# Patient Record
Sex: Male | Born: 1955 | Race: White | Hispanic: No | Marital: Married | Smoking: Former smoker
Health system: Southern US, Community
[De-identification: ages and names within clinical notes are randomized; demographics above are authoritative.]

## PROBLEM LIST (undated history)

## (undated) DIAGNOSIS — E785 Hyperlipidemia, unspecified: Secondary | ICD-10-CM

## (undated) DIAGNOSIS — M545 Low back pain, unspecified: Secondary | ICD-10-CM

## (undated) DIAGNOSIS — M4810 Ankylosing hyperostosis [Forestier], site unspecified: Secondary | ICD-10-CM

## (undated) DIAGNOSIS — I10 Essential (primary) hypertension: Secondary | ICD-10-CM

## (undated) DIAGNOSIS — J45909 Unspecified asthma, uncomplicated: Secondary | ICD-10-CM

## (undated) DIAGNOSIS — M109 Gout, unspecified: Secondary | ICD-10-CM

## (undated) DIAGNOSIS — F32A Depression, unspecified: Secondary | ICD-10-CM

## (undated) DIAGNOSIS — M5412 Radiculopathy, cervical region: Secondary | ICD-10-CM

## (undated) DIAGNOSIS — M1990 Unspecified osteoarthritis, unspecified site: Secondary | ICD-10-CM

## (undated) HISTORY — DX: Low back pain: M54.5

## (undated) HISTORY — PX: HIP SURGERY: SHX245

## (undated) HISTORY — PX: HERNIA REPAIR: SHX51

## (undated) HISTORY — DX: Hyperlipidemia, unspecified: E78.5

## (undated) HISTORY — DX: Gout, unspecified: M10.9

## (undated) HISTORY — PX: SHOULDER SURGERY: SHX246

## (undated) HISTORY — PX: CARDIAC CATHETERIZATION: SHX172

## (undated) HISTORY — DX: Radiculopathy, cervical region: M54.12

## (undated) HISTORY — PX: KNEE SURGERY: SHX244

## (undated) HISTORY — DX: Unspecified osteoarthritis, unspecified site: M19.90

## (undated) HISTORY — DX: Ankylosing hyperostosis (forestier), site unspecified: M48.10

## (undated) HISTORY — PX: COLONOSCOPY: SHX174

## (undated) HISTORY — DX: Essential (primary) hypertension: I10

## (undated) HISTORY — DX: Depression, unspecified: F32.A

## (undated) HISTORY — DX: Low back pain, unspecified: M54.50

---

## 1989-10-24 HISTORY — PX: BACK SURGERY: SHX140

## 2009-07-20 ENCOUNTER — Encounter: Admission: RE | Admit: 2009-07-20 | Discharge: 2009-07-20 | Payer: Self-pay | Admitting: Neurological Surgery

## 2010-11-15 ENCOUNTER — Encounter: Payer: Self-pay | Admitting: Neurological Surgery

## 2011-02-23 ENCOUNTER — Other Ambulatory Visit: Payer: Self-pay | Admitting: Neurological Surgery

## 2011-02-23 DIAGNOSIS — M5416 Radiculopathy, lumbar region: Secondary | ICD-10-CM

## 2011-02-23 DIAGNOSIS — M47816 Spondylosis without myelopathy or radiculopathy, lumbar region: Secondary | ICD-10-CM

## 2011-02-24 ENCOUNTER — Ambulatory Visit
Admission: RE | Admit: 2011-02-24 | Discharge: 2011-02-24 | Disposition: A | Discharge status: Home / Self Care | Payer: BC Managed Care – PPO | Source: Ambulatory Visit | Attending: Neurological Surgery | Admitting: Neurological Surgery

## 2011-02-24 DIAGNOSIS — M5416 Radiculopathy, lumbar region: Secondary | ICD-10-CM

## 2011-02-24 DIAGNOSIS — M47816 Spondylosis without myelopathy or radiculopathy, lumbar region: Secondary | ICD-10-CM

## 2011-03-08 ENCOUNTER — Other Ambulatory Visit: Payer: Self-pay | Admitting: Neurological Surgery

## 2011-03-08 DIAGNOSIS — M5416 Radiculopathy, lumbar region: Secondary | ICD-10-CM

## 2011-03-08 DIAGNOSIS — M47816 Spondylosis without myelopathy or radiculopathy, lumbar region: Secondary | ICD-10-CM

## 2011-03-09 ENCOUNTER — Other Ambulatory Visit: Payer: Self-pay | Admitting: Neurological Surgery

## 2011-03-09 ENCOUNTER — Ambulatory Visit
Admission: RE | Admit: 2011-03-09 | Discharge: 2011-03-09 | Disposition: A | Discharge status: Home / Self Care | Payer: BC Managed Care – PPO | Source: Ambulatory Visit | Attending: Neurological Surgery | Admitting: Neurological Surgery

## 2011-03-09 DIAGNOSIS — M5416 Radiculopathy, lumbar region: Secondary | ICD-10-CM

## 2011-03-09 DIAGNOSIS — M47816 Spondylosis without myelopathy or radiculopathy, lumbar region: Secondary | ICD-10-CM

## 2013-01-28 ENCOUNTER — Other Ambulatory Visit: Payer: Self-pay | Admitting: Neurological Surgery

## 2013-01-28 DIAGNOSIS — M47812 Spondylosis without myelopathy or radiculopathy, cervical region: Secondary | ICD-10-CM

## 2013-01-28 DIAGNOSIS — M546 Pain in thoracic spine: Secondary | ICD-10-CM

## 2013-01-31 ENCOUNTER — Ambulatory Visit
Admission: RE | Admit: 2013-01-31 | Discharge: 2013-01-31 | Disposition: A | Discharge status: Home / Self Care | Payer: BC Managed Care – PPO | Source: Ambulatory Visit | Attending: Neurological Surgery | Admitting: Neurological Surgery

## 2013-01-31 DIAGNOSIS — M546 Pain in thoracic spine: Secondary | ICD-10-CM

## 2013-01-31 DIAGNOSIS — M47812 Spondylosis without myelopathy or radiculopathy, cervical region: Secondary | ICD-10-CM

## 2013-01-31 MED ORDER — TRIAMCINOLONE ACETONIDE 40 MG/ML IJ SUSP (RADIOLOGY)
60.0000 mg | Freq: Once | INTRAMUSCULAR | Status: AC
  Administered 2013-01-31: 60 mg via EPIDURAL

## 2013-01-31 MED ORDER — IOHEXOL 300 MG/ML  SOLN
1.0000 mL | Freq: Once | INTRAMUSCULAR | Status: AC | PRN
Start: 2013-01-31 — End: 2013-01-31
  Administered 2013-01-31: 1 mL via EPIDURAL

## 2013-02-01 ENCOUNTER — Other Ambulatory Visit: Payer: Self-pay | Admitting: Neurological Surgery

## 2013-02-01 DIAGNOSIS — M542 Cervicalgia: Secondary | ICD-10-CM

## 2013-02-14 ENCOUNTER — Ambulatory Visit
Admission: RE | Admit: 2013-02-14 | Discharge: 2013-02-14 | Disposition: A | Discharge status: Home / Self Care | Payer: BC Managed Care – PPO | Source: Ambulatory Visit | Attending: Neurological Surgery | Admitting: Neurological Surgery

## 2013-02-14 VITALS — BP 144/81 | HR 79

## 2013-02-14 DIAGNOSIS — M5020 Other cervical disc displacement, unspecified cervical region: Secondary | ICD-10-CM

## 2013-02-14 DIAGNOSIS — M542 Cervicalgia: Secondary | ICD-10-CM

## 2013-02-14 MED ORDER — IOHEXOL 300 MG/ML  SOLN
1.0000 mL | Freq: Once | INTRAMUSCULAR | Status: AC | PRN
  Administered 2013-02-14: 1 mL via EPIDURAL

## 2013-02-14 MED ORDER — TRIAMCINOLONE ACETONIDE 40 MG/ML IJ SUSP (RADIOLOGY)
60.0000 mg | Freq: Once | INTRAMUSCULAR | Status: AC
  Administered 2013-02-14: 60 mg via EPIDURAL

## 2013-07-12 ENCOUNTER — Other Ambulatory Visit: Payer: Self-pay | Admitting: Neurological Surgery

## 2013-07-24 ENCOUNTER — Inpatient Hospital Stay (HOSPITAL_COMMUNITY): Admission: RE | Admit: 2013-07-24 | Payer: BC Managed Care – PPO | Source: Ambulatory Visit

## 2013-07-24 ENCOUNTER — Encounter (HOSPITAL_COMMUNITY): Payer: Self-pay

## 2013-07-26 ENCOUNTER — Encounter (HOSPITAL_COMMUNITY)
Admission: RE | Admit: 2013-07-26 | Discharge: 2013-07-26 | Disposition: A | Discharge status: Home / Self Care | Payer: BC Managed Care – PPO | Source: Ambulatory Visit | Attending: Neurological Surgery | Admitting: Neurological Surgery

## 2013-07-26 ENCOUNTER — Encounter (HOSPITAL_COMMUNITY): Payer: Self-pay

## 2013-07-26 ENCOUNTER — Other Ambulatory Visit (HOSPITAL_COMMUNITY): Payer: Self-pay | Admitting: *Deleted

## 2013-07-26 DIAGNOSIS — Z01818 Encounter for other preprocedural examination: Secondary | ICD-10-CM | POA: Insufficient documentation

## 2013-07-26 DIAGNOSIS — Z01812 Encounter for preprocedural laboratory examination: Secondary | ICD-10-CM | POA: Insufficient documentation

## 2013-07-26 HISTORY — DX: Unspecified asthma, uncomplicated: J45.909

## 2013-07-26 LAB — CBC WITH DIFFERENTIAL/PLATELET
HCT: 48.4 % (ref 39.0–52.0)
Hemoglobin: 17.1 g/dL — ABNORMAL HIGH (ref 13.0–17.0)
Lymphocytes Relative: 25 % (ref 12–46)
Lymphs Abs: 1.6 10*3/uL (ref 0.7–4.0)
MCHC: 35.3 g/dL (ref 30.0–36.0)
MCV: 93.8 fL (ref 78.0–100.0)
Monocytes Absolute: 0.9 10*3/uL (ref 0.1–1.0)
Monocytes Relative: 14 % — ABNORMAL HIGH (ref 3–12)
Neutrophils Relative %: 53 % (ref 43–77)
Platelets: 157 10*3/uL (ref 150–400)
RBC: 5.16 MIL/uL (ref 4.22–5.81)
WBC: 6.2 10*3/uL (ref 4.0–10.5)

## 2013-07-26 LAB — BASIC METABOLIC PANEL
BUN: 12 mg/dL (ref 6–23)
CO2: 29 mEq/L (ref 19–32)
Calcium: 8.8 mg/dL (ref 8.4–10.5)
Chloride: 101 mEq/L (ref 96–112)
GFR calc Af Amer: >90 mL/min (ref >90)
Glucose, Bld: 103 mg/dL — ABNORMAL HIGH (ref 70–99)
Potassium: 4.7 mEq/L (ref 3.5–5.1)

## 2013-07-26 NOTE — Pre-Procedure Instructions (Signed)
MARCELLE BEBOUT  07/26/2013   Your procedure is scheduled on:  Monday, July 29, 2013 at 1:30 PM.   Report to [facility] Main Entrance "A" at 10:30 AM.   Call this number if you have problems the morning of surgery: [phone]   Remember:   Do not eat food or drink liquids after midnight Sunday, 07/28/13.   Take these medicines the morning of surgery with A SIP OF WATER: oxyCODONE-acetaminophen (PERCOCET/ROXICET) -if needed  Stop all Vitamins, Herbal Medications, Fish Oil, Zyrtec-D, Aspirin and don't take any NSAIDS (Ibuprofen, Motrin, Aleve, Naproxen, etc.) as of today 07/26/13.     Do not wear jewelry  Do not wear lotions, powders, or cologne. You may wear deodorant.  Do not shave 48 hours prior to surgery. Men may shave face and neck.  Do not bring valuables to the hospital.  [facility] is not responsible                  for any belongings or valuables.               Contacts, dentures or bridgework may not be worn into surgery.  Leave suitcase in the car. After surgery it may be brought to your room.  For patients admitted to the hospital, discharge time is determined by your                treatment team.     Special Instructions: Shower using CHG 2 nights before surgery and the night before surgery.  If you shower the day of surgery use CHG.  Use special wash - you have one bottle of CHG for all showers.  You should use approximately 1/3 of the bottle for each shower.   Please read over the following fact sheets that you were given: Pain Booklet, Coughing and Deep Breathing, Blood Transfusion Information, MRSA Information and Surgical Site Infection Prevention

## 2013-07-26 NOTE — Progress Notes (Signed)
07/26/13 1252  OBSTRUCTIVE SLEEP APNEA  Have you ever been diagnosed with sleep apnea through a sleep study? No  Do you snore loudly (loud enough to be heard through closed doors)?  1  Do you often feel tired, fatigued, or sleepy during the daytime? 1  Has anyone observed you stop breathing during your sleep? 0  Do you have, or are you being treated for high blood pressure? 0  BMI more than 35 kg/m2? 1  Age over 57 years old? 1  Neck circumference greater than 40 cm/18 inches? 1  Gender: 1  Obstructive Sleep Apnea Score 6  Score 4 or greater  Results sent to PCP

## 2013-07-26 NOTE — Progress Notes (Signed)
Pt have a Type and Screen drawn today, but states he would not be able to tolerate the armband to be on him until surgery. He requested that we draw blood for the Type and Screen on day of surgery.

## 2013-07-29 ENCOUNTER — Inpatient Hospital Stay (HOSPITAL_COMMUNITY): Payer: BC Managed Care – PPO

## 2013-07-29 ENCOUNTER — Encounter (HOSPITAL_COMMUNITY)
Admission: RE | Disposition: A | Discharge status: Home / Self Care | Payer: Self-pay | Source: Ambulatory Visit | Attending: Neurological Surgery

## 2013-07-29 ENCOUNTER — Inpatient Hospital Stay (HOSPITAL_COMMUNITY): Payer: BC Managed Care – PPO | Admitting: Anesthesiology

## 2013-07-29 ENCOUNTER — Encounter (HOSPITAL_COMMUNITY): Payer: Self-pay | Admitting: *Deleted

## 2013-07-29 ENCOUNTER — Encounter (HOSPITAL_COMMUNITY): Payer: Self-pay | Admitting: Anesthesiology

## 2013-07-29 ENCOUNTER — Inpatient Hospital Stay (HOSPITAL_COMMUNITY)
Admission: RE | Admit: 2013-07-29 | Discharge: 2013-08-01 | DRG: 755 | Disposition: A | Discharge status: Home / Self Care | Payer: BC Managed Care – PPO | Source: Ambulatory Visit | Attending: Neurological Surgery | Admitting: Neurological Surgery

## 2013-07-29 DIAGNOSIS — Z87891 Personal history of nicotine dependence: Secondary | ICD-10-CM

## 2013-07-29 DIAGNOSIS — M4716 Other spondylosis with myelopathy, lumbar region: Secondary | ICD-10-CM | POA: Diagnosis present

## 2013-07-29 DIAGNOSIS — M5416 Radiculopathy, lumbar region: Secondary | ICD-10-CM | POA: Diagnosis present

## 2013-07-29 DIAGNOSIS — Z01812 Encounter for preprocedural laboratory examination: Secondary | ICD-10-CM

## 2013-07-29 DIAGNOSIS — M216X9 Other acquired deformities of unspecified foot: Secondary | ICD-10-CM | POA: Diagnosis present

## 2013-07-29 DIAGNOSIS — Z7982 Long term (current) use of aspirin: Secondary | ICD-10-CM

## 2013-07-29 DIAGNOSIS — D62 Acute posthemorrhagic anemia: Secondary | ICD-10-CM

## 2013-07-29 DIAGNOSIS — Z23 Encounter for immunization: Secondary | ICD-10-CM

## 2013-07-29 DIAGNOSIS — Z79899 Other long term (current) drug therapy: Secondary | ICD-10-CM

## 2013-07-29 DIAGNOSIS — M47817 Spondylosis without myelopathy or radiculopathy, lumbosacral region: Principal | ICD-10-CM | POA: Diagnosis present

## 2013-07-29 LAB — ABO/RH: ABO/RH(D): A POS

## 2013-07-29 LAB — TYPE AND SCREEN
ABO/RH(D): A POS
Antibody Screen: NEGATIVE

## 2013-07-29 SURGERY — POSTERIOR LUMBAR FUSION 2 LEVEL
Anesthesia: General | Site: Back | Wound class: Clean

## 2013-07-29 MED ORDER — HYDROMORPHONE HCL PF 1 MG/ML IJ SOLN
0.5000 mg | INTRAMUSCULAR | Status: DC | PRN
  Administered 2013-07-30: 1 mg via INTRAVENOUS
  Filled 2013-07-29: qty 1

## 2013-07-29 MED ORDER — PHENOL 1.4 % MT LIQD: 1.0000 | OROMUCOSAL | Status: DC | PRN

## 2013-07-29 MED ORDER — SODIUM CHLORIDE 0.9 % IV SOLN: 250.0000 mL | INTRAVENOUS | Status: DC

## 2013-07-29 MED ORDER — ACETAMINOPHEN 650 MG RE SUPP: 650.0000 mg | RECTAL | Status: DC | PRN

## 2013-07-29 MED ORDER — PHENYLEPHRINE HCL 10 MG/ML IJ SOLN
INTRAMUSCULAR | Status: DC | PRN
  Administered 2013-07-29: 80 ug via INTRAVENOUS

## 2013-07-29 MED ORDER — ROCURONIUM BROMIDE 100 MG/10ML IV SOLN
INTRAVENOUS | Status: DC | PRN
  Administered 2013-07-29: 20 mg via INTRAVENOUS
  Administered 2013-07-29 (×2): 10 mg via INTRAVENOUS
  Administered 2013-07-29: 20 mg via INTRAVENOUS
  Administered 2013-07-29: 50 mg via INTRAVENOUS
  Administered 2013-07-29: 10 mg via INTRAVENOUS
  Administered 2013-07-29: 20 mg via INTRAVENOUS
  Administered 2013-07-29: 10 mg via INTRAVENOUS

## 2013-07-29 MED ORDER — VANCOMYCIN HCL IN DEXTROSE 1-5 GM/200ML-% IV SOLN
INTRAVENOUS | Status: AC
  Administered 2013-07-29: 1000 mg via INTRAVENOUS
  Filled 2013-07-29: qty 200

## 2013-07-29 MED ORDER — METHOCARBAMOL 500 MG PO TABS
ORAL_TABLET | ORAL | Status: AC
  Filled 2013-07-29: qty 1

## 2013-07-29 MED ORDER — SODIUM CHLORIDE 0.9 % IV SOLN
INTRAVENOUS | Status: DC
  Administered 2013-07-29 – 2013-07-30 (×2): via INTRAVENOUS

## 2013-07-29 MED ORDER — SUFENTANIL CITRATE 50 MCG/ML IV SOLN
INTRAVENOUS | Status: DC | PRN
  Administered 2013-07-29: 20 ug via INTRAVENOUS
  Administered 2013-07-29 (×3): 10 ug via INTRAVENOUS
  Administered 2013-07-29 (×2): 15 ug via INTRAVENOUS
  Administered 2013-07-29: 20 ug via INTRAVENOUS

## 2013-07-29 MED ORDER — LIDOCAINE-EPINEPHRINE 1 %-1:100000 IJ SOLN
INTRAMUSCULAR | Status: DC | PRN
  Administered 2013-07-29: 5 mL

## 2013-07-29 MED ORDER — ONDANSETRON HCL 4 MG/2ML IJ SOLN
INTRAMUSCULAR | Status: DC | PRN
  Administered 2013-07-29: 4 mg via INTRAMUSCULAR

## 2013-07-29 MED ORDER — MENTHOL 3 MG MT LOZG: 1.0000 | LOZENGE | OROMUCOSAL | Status: DC | PRN

## 2013-07-29 MED ORDER — DEXAMETHASONE SODIUM PHOSPHATE 10 MG/ML IJ SOLN
INTRAMUSCULAR | Status: DC | PRN
  Administered 2013-07-29: 10 mg via INTRAVENOUS

## 2013-07-29 MED ORDER — FLEET ENEMA 7-19 GM/118ML RE ENEM: 1.0000 | ENEMA | Freq: Once | RECTAL | Status: AC | PRN

## 2013-07-29 MED ORDER — SODIUM CHLORIDE 0.9 % IJ SOLN
3.0000 mL | Freq: Two times a day (BID) | INTRAMUSCULAR | Status: DC
  Administered 2013-07-30 – 2013-07-31 (×3): 3 mL via INTRAVENOUS

## 2013-07-29 MED ORDER — LACTATED RINGERS IV SOLN
INTRAVENOUS | Status: DC | PRN
  Administered 2013-07-29 (×3): via INTRAVENOUS

## 2013-07-29 MED ORDER — HYDROMORPHONE HCL PF 1 MG/ML IJ SOLN
INTRAMUSCULAR | Status: AC
  Filled 2013-07-29: qty 1

## 2013-07-29 MED ORDER — BUPIVACAINE HCL (PF) 0.5 % IJ SOLN
INTRAMUSCULAR | Status: DC | PRN
  Administered 2013-07-29: 5 mL
  Administered 2013-07-29: 20 mL

## 2013-07-29 MED ORDER — BISACODYL 10 MG RE SUPP: 10.0000 mg | Freq: Every day | RECTAL | Status: DC | PRN

## 2013-07-29 MED ORDER — ONDANSETRON HCL 4 MG/2ML IJ SOLN: 4.0000 mg | INTRAMUSCULAR | Status: DC | PRN

## 2013-07-29 MED ORDER — PROPOFOL 10 MG/ML IV BOLUS
INTRAVENOUS | Status: DC | PRN
  Administered 2013-07-29: 200 mg via INTRAVENOUS

## 2013-07-29 MED ORDER — SODIUM CHLORIDE 0.9 % IR SOLN
Status: DC | PRN
  Administered 2013-07-29: 17:00:00

## 2013-07-29 MED ORDER — POLYETHYLENE GLYCOL 3350 17 G PO PACK
17.0000 g | PACK | Freq: Every day | ORAL | Status: DC | PRN
  Filled 2013-07-29: qty 1

## 2013-07-29 MED ORDER — FENTANYL CITRATE 0.05 MG/ML IJ SOLN
INTRAMUSCULAR | Status: AC
  Administered 2013-07-29: 50 ug
  Filled 2013-07-29: qty 2

## 2013-07-29 MED ORDER — THROMBIN 5000 UNITS EX SOLR
OROMUCOSAL | Status: DC | PRN
  Administered 2013-07-29: 19:00:00 via TOPICAL

## 2013-07-29 MED ORDER — DOCUSATE SODIUM 100 MG PO CAPS
100.0000 mg | ORAL_CAPSULE | Freq: Two times a day (BID) | ORAL | Status: DC
  Administered 2013-07-30 – 2013-07-31 (×4): 100 mg via ORAL
  Filled 2013-07-29 (×3): qty 1

## 2013-07-29 MED ORDER — SODIUM CHLORIDE 0.9 % IJ SOLN: 3.0000 mL | INTRAMUSCULAR | Status: DC | PRN

## 2013-07-29 MED ORDER — NEOSTIGMINE METHYLSULFATE 1 MG/ML IJ SOLN
INTRAMUSCULAR | Status: DC | PRN
  Administered 2013-07-29: 5 mg via INTRAVENOUS

## 2013-07-29 MED ORDER — LIDOCAINE HCL (CARDIAC) 20 MG/ML IV SOLN
INTRAVENOUS | Status: DC | PRN
  Administered 2013-07-29: 50 mg via INTRAVENOUS

## 2013-07-29 MED ORDER — METHOCARBAMOL 100 MG/ML IJ SOLN
500.0000 mg | Freq: Four times a day (QID) | INTRAVENOUS | Status: DC | PRN
  Filled 2013-07-29: qty 5

## 2013-07-29 MED ORDER — SODIUM CHLORIDE 0.9 % IV SOLN
INTRAVENOUS | Status: DC | PRN
  Administered 2013-07-29 (×2): via INTRAVENOUS

## 2013-07-29 MED ORDER — OXYCODONE-ACETAMINOPHEN 5-325 MG PO TABS
1.0000 | ORAL_TABLET | ORAL | Status: DC | PRN
  Administered 2013-07-29 – 2013-08-01 (×8): 2 via ORAL
  Filled 2013-07-29 (×8): qty 2

## 2013-07-29 MED ORDER — MIDAZOLAM HCL 5 MG/5ML IJ SOLN
INTRAMUSCULAR | Status: DC | PRN
  Administered 2013-07-29: 2 mg via INTRAVENOUS

## 2013-07-29 MED ORDER — FENTANYL CITRATE 0.05 MG/ML IJ SOLN
INTRAMUSCULAR | Status: AC
  Administered 2013-07-29 (×2): 50 ug
  Filled 2013-07-29: qty 2

## 2013-07-29 MED ORDER — THROMBIN 20000 UNITS EX SOLR
CUTANEOUS | Status: DC | PRN
  Administered 2013-07-29: 17:00:00 via TOPICAL

## 2013-07-29 MED ORDER — ONDANSETRON HCL 4 MG/2ML IJ SOLN: 4.0000 mg | Freq: Once | INTRAMUSCULAR | Status: DC | PRN

## 2013-07-29 MED ORDER — LACTATED RINGERS IV SOLN
INTRAVENOUS | Status: DC
  Administered 2013-07-29: 11:00:00 via INTRAVENOUS

## 2013-07-29 MED ORDER — PHENYLEPHRINE HCL 10 MG/ML IJ SOLN
10.0000 mg | INTRAVENOUS | Status: DC | PRN
  Administered 2013-07-29: 20 ug/min via INTRAVENOUS

## 2013-07-29 MED ORDER — ACETAMINOPHEN 325 MG PO TABS: 650.0000 mg | ORAL_TABLET | ORAL | Status: DC | PRN

## 2013-07-29 MED ORDER — HYDROMORPHONE HCL PF 1 MG/ML IJ SOLN
0.2500 mg | INTRAMUSCULAR | Status: DC | PRN
  Administered 2013-07-29 (×4): 0.5 mg via INTRAVENOUS

## 2013-07-29 MED ORDER — GLYCOPYRROLATE 0.2 MG/ML IJ SOLN
INTRAMUSCULAR | Status: DC | PRN
  Administered 2013-07-29: .8 mg via INTRAVENOUS

## 2013-07-29 MED ORDER — SENNA 8.6 MG PO TABS
1.0000 | ORAL_TABLET | Freq: Two times a day (BID) | ORAL | Status: DC
  Administered 2013-07-30 – 2013-07-31 (×4): 8.6 mg via ORAL
  Filled 2013-07-29 (×7): qty 1

## 2013-07-29 MED ORDER — VANCOMYCIN HCL 1000 MG IV SOLR
INTRAVENOUS | Status: AC
  Filled 2013-07-29: qty 1000

## 2013-07-29 MED ORDER — 0.9 % SODIUM CHLORIDE (POUR BTL) OPTIME
TOPICAL | Status: DC | PRN
  Administered 2013-07-29: 1000 mL

## 2013-07-29 MED ORDER — METHOCARBAMOL 500 MG PO TABS
500.0000 mg | ORAL_TABLET | Freq: Four times a day (QID) | ORAL | Status: DC | PRN
  Administered 2013-07-29 – 2013-07-30 (×2): 500 mg via ORAL
  Filled 2013-07-29: qty 1

## 2013-07-29 SURGICAL SUPPLY — 70 items
BAG DECANTER FOR FLEXI CONT (MISCELLANEOUS) ×2 IMPLANT
BLADE SURG ROTATE 9660 (MISCELLANEOUS) IMPLANT
BUR MATCHSTICK NEURO 3.0 LAGG (BURR) ×2 IMPLANT
CAGE 9MM (Cage) ×4 IMPLANT
CANISTER SUCTION 2500CC (MISCELLANEOUS) ×2 IMPLANT
CLOTH BEACON ORANGE TIMEOUT ST (SAFETY) ×2 IMPLANT
CONT SPEC 4OZ CLIKSEAL STRL BL (MISCELLANEOUS) ×4 IMPLANT
COVER BACK TABLE 24X17X13 BIG (DRAPES) IMPLANT
COVER TABLE BACK 60X90 (DRAPES) ×2 IMPLANT
DECANTER SPIKE VIAL GLASS SM (MISCELLANEOUS) IMPLANT
DERMABOND ADVANCED (GAUZE/BANDAGES/DRESSINGS) ×1
DERMABOND ADVANCED .7 DNX12 (GAUZE/BANDAGES/DRESSINGS) ×1 IMPLANT
DRAPE C-ARM 42X72 X-RAY (DRAPES) ×4 IMPLANT
DRAPE LAPAROTOMY 100X72X124 (DRAPES) ×2 IMPLANT
DRAPE POUCH INSTRU U-SHP 10X18 (DRAPES) ×2 IMPLANT
DRAPE PROXIMA HALF (DRAPES) IMPLANT
DRSG OPSITE POSTOP 4X8 (GAUZE/BANDAGES/DRESSINGS) ×2 IMPLANT
DURAPREP 26ML APPLICATOR (WOUND CARE) ×2 IMPLANT
ELECT REM PT RETURN 9FT ADLT (ELECTROSURGICAL) ×2
ELECTRODE REM PT RTRN 9FT ADLT (ELECTROSURGICAL) ×1 IMPLANT
GAUZE SPONGE 4X4 16PLY XRAY LF (GAUZE/BANDAGES/DRESSINGS) IMPLANT
GLOVE BIO SURGEON STRL SZ 6.5 (GLOVE) ×4 IMPLANT
GLOVE BIOGEL PI IND STRL 6.5 (GLOVE) ×1 IMPLANT
GLOVE BIOGEL PI IND STRL 7.5 (GLOVE) ×1 IMPLANT
GLOVE BIOGEL PI IND STRL 8.5 (GLOVE) ×2 IMPLANT
GLOVE BIOGEL PI INDICATOR 6.5 (GLOVE) ×1
GLOVE BIOGEL PI INDICATOR 7.5 (GLOVE) ×1
GLOVE BIOGEL PI INDICATOR 8.5 (GLOVE) ×2
GLOVE ECLIPSE 7.5 STRL STRAW (GLOVE) ×2 IMPLANT
GLOVE ECLIPSE 8.5 STRL (GLOVE) ×4 IMPLANT
GLOVE EXAM NITRILE LRG STRL (GLOVE) IMPLANT
GLOVE EXAM NITRILE MD LF STRL (GLOVE) ×2 IMPLANT
GLOVE EXAM NITRILE XL STR (GLOVE) IMPLANT
GLOVE EXAM NITRILE XS STR PU (GLOVE) IMPLANT
GOWN BRE IMP SLV AUR LG STRL (GOWN DISPOSABLE) ×2 IMPLANT
GOWN BRE IMP SLV AUR XL STRL (GOWN DISPOSABLE) ×2 IMPLANT
GOWN STRL REIN 2XL LVL4 (GOWN DISPOSABLE) ×4 IMPLANT
HEMOSTAT POWDER KIT SURGIFOAM (HEMOSTASIS) IMPLANT
KIT BASIN OR (CUSTOM PROCEDURE TRAY) ×2 IMPLANT
KIT ROOM TURNOVER OR (KITS) ×2 IMPLANT
MILL MEDIUM DISP (BLADE) ×2 IMPLANT
NEEDLE HYPO 22GX1.5 SAFETY (NEEDLE) ×2 IMPLANT
NEEDLE SPNL 18GX3.5 QUINCKE PK (NEEDLE) ×2 IMPLANT
NS IRRIG 1000ML POUR BTL (IV SOLUTION) ×2 IMPLANT
PACK FOAM VITOSS 10CC (Orthopedic Implant) ×2 IMPLANT
PACK LAMINECTOMY NEURO (CUSTOM PROCEDURE TRAY) ×2 IMPLANT
PAD ARMBOARD 7.5X6 YLW CONV (MISCELLANEOUS) ×6 IMPLANT
PATTIES SURGICAL .5 X1 (DISPOSABLE) ×2 IMPLANT
PEEK PLIF NOVEL 9X25X10 (Peek) ×4 IMPLANT
ROD TI 5.5MM 6CM (Rod) ×4 IMPLANT
SCREW PEDICLE 45MM (Screw) ×4 IMPLANT
SCREW POLYAX 6.5X45MM (Screw) ×8 IMPLANT
SCREW SET SPINAL STD HEXALOBE (Screw) ×12 IMPLANT
SPONGE GAUZE 4X4 12PLY (GAUZE/BANDAGES/DRESSINGS) ×2 IMPLANT
SPONGE LAP 4X18 X RAY DECT (DISPOSABLE) ×2 IMPLANT
SPONGE SURGIFOAM ABS GEL 100 (HEMOSTASIS) ×2 IMPLANT
SUT PROLENE 6 0 BV (SUTURE) ×2 IMPLANT
SUT VIC AB 1 CT1 18XBRD ANBCTR (SUTURE) ×1 IMPLANT
SUT VIC AB 1 CT1 8-18 (SUTURE) ×1
SUT VIC AB 2-0 CP2 18 (SUTURE) ×2 IMPLANT
SUT VIC AB 3-0 SH 8-18 (SUTURE) ×2 IMPLANT
SYR 20ML ECCENTRIC (SYRINGE) ×2 IMPLANT
SYR 3ML LL SCALE MARK (SYRINGE) ×8 IMPLANT
SYR 5ML LL (SYRINGE) IMPLANT
TOWEL OR 17X24 6PK STRL BLUE (TOWEL DISPOSABLE) ×2 IMPLANT
TOWEL OR 17X26 10 PK STRL BLUE (TOWEL DISPOSABLE) ×2 IMPLANT
TRAP SPECIMEN MUCOUS 40CC (MISCELLANEOUS) ×2 IMPLANT
TRAY FOLEY CATH 14FRSI W/METER (CATHETERS) IMPLANT
TRAY FOLEY CATH 16FRSI W/METER (SET/KITS/TRAYS/PACK) ×2 IMPLANT
WATER STERILE IRR 1000ML POUR (IV SOLUTION) ×2 IMPLANT

## 2013-07-29 NOTE — Anesthesia Procedure Notes (Signed)
Procedure Name: Intubation Date/Time: 07/29/2013 4:50 PM Performed by: Coralee Rud Pre-anesthesia Checklist: Patient identified, Emergency Drugs available, Suction available, Patient being monitored and Timeout performed Patient Re-evaluated:Patient Re-evaluated prior to inductionOxygen Delivery Method: Circle system utilized Preoxygenation: Pre-oxygenation with 100% oxygen Intubation Type: IV induction Ventilation: Mask ventilation without difficulty and Oral airway inserted - appropriate to patient size Laryngoscope Size: Hyacinth Meeker and 2 (Attempted x 2 with Hyacinth Meeker 3,  Dr. Noreene Larsson intubated with #2) Grade View: Grade III Tube type: Oral Tube size: 8.0 mm Number of attempts: 3 (I attempted x 2,  Dr. Noreene Larsson intubated on 3rd attempt.) Airway Equipment and Method: Stylet Placement Confirmation: ETT inserted through vocal cords under direct vision,  positive ETCO2 and breath sounds checked- equal and bilateral Secured at: 22 cm Tube secured with: Tape Dental Injury: Teeth and Oropharynx as per pre-operative assessment and Injury to lip  Future Recommendations: Recommend- induction with short-acting agent, and alternative techniques readily available

## 2013-07-29 NOTE — H&P (Signed)
Ronald Parsons is an 57 y.o. male.   Chief Complaint: Back pain leg weakness. HPI: Patient is a 57 year old individual who has had significant problems with back pain in lower extremity he weakness. He has been treated conservatively with a number of epidural steroid injections however back pain is been worsening such that he cannot do his usual work. 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 Is being admitted now to undergo surgical decompression and stabilization lower 2 levels of his lumbar spine secondary to a markedly degenerated broad-based disc protrusion at L4-L5 and lateral recess stenosis that is severe at L5-S1.  Past Medical History  Diagnosis Date  . Asthma     allergy related    Past Surgical History  Procedure Laterality Date  . Colonoscopy    . Back surgery  1991    laminectomy  . Hernia repair      inguinal hernia repair as a baby on right groin  . Cardiac catheterization      5-7 years ago, states it was clear    No family history on file. Social History:  reports that he quit smoking about 10 years ago. His smokeless tobacco use includes Chew. He reports that  drinks alcohol. He reports that he does not use illicit drugs.  Allergies:  Allergies  Allergen Reactions  . Penicillins Hives    As a child    No prescriptions prior to admission    No results found for this or any previous visit (from the past 48 hour(s)). No results found.  Review of Systems  Constitutional:       Weakness of both lower extremities  HENT: Negative.   Eyes: Negative.   Respiratory: Negative.   Cardiovascular: Negative.   Gastrointestinal: Negative.   Genitourinary: Negative.   Musculoskeletal: Positive for back pain.       Neck pain with radiation into both lower extremities  Skin: Negative.   Neurological: Positive for  weakness.  Endo/Heme/Allergies: Negative.   Psychiatric/Behavioral: Negative.     There were no vitals taken for this visit. Physical Exam  Constitutional: He is oriented to person, place, and time. He appears well-developed and well-nourished.  HENT:  Head: Normocephalic and atraumatic.  Eyes: Conjunctivae and EOM are normal. Pupils are equal, round, and reactive to light.  Neck: Normal range of motion. Neck supple.  Cardiovascular: Regular rhythm.   Respiratory: Effort normal and breath sounds normal.  GI: Soft. Bowel sounds are normal.  Musculoskeletal:  Centralized back pain with substantial muscle spasm. Significant weakness in tibialis anterior on the right recent since past week  Neurological: He is alert and oriented to person, place, and time.  Cranial nerve examination is normal lower extremity strength however with a reveals weakness in tibialis anterior graded 2/5 this is acute. Extensor hallucis longus is also very weak when out of 5. Sensory exam reveals diminished pin sensation on the dorsum of the right foot in the tibialis anterior group.  Skin: Skin is warm and dry.  Psychiatric: He has a normal mood and affect. His behavior is normal. Judgment and thought content normal.     Assessment/Plan Decompression arthrodesis L4-5 and L5-S1 secondary to spondylitic stenosis. Recent footdrop.  Ronald Parsons J 07/29/2013, 7:54 AM

## 2013-07-29 NOTE — Progress Notes (Signed)
Patient ID: Ronald Parsons, male   DOB: 1956-03-19, 57 y.o.   MRN: [id number] Vital signs are stable. Moderate back soreness. Incision remains dry and dressing intact. Motor function in lower extremities good.

## 2013-07-29 NOTE — Transfer of Care (Signed)
Immediate Anesthesia Transfer of Care Note  Patient: Ronald Parsons  Procedure(s) Performed: Procedure(s) with comments: POSTERIOR LUMBAR FUSION 2 LEVEL (N/A) - L4-5 L5-S1 Posterior lumbar interbody fusion  Patient Location: PACU  Anesthesia Type:General  Level of Consciousness: awake, alert  and oriented  Airway & Oxygen Therapy: Patient connected to nasal cannula oxygen  Post-op Assessment: Report given to PACU RN, Post -op Vital signs reviewed and stable and Patient moving all extremities X 4  Post vital signs: Reviewed and stable  Complications: No apparent anesthesia complications

## 2013-07-29 NOTE — Anesthesia Preprocedure Evaluation (Signed)
Anesthesia Evaluation  Patient identified by MRN, date of birth, ID band Patient awake    Reviewed: Allergy & Precautions, H&P , NPO status , Patient's Chart, lab work & pertinent test results  Airway Mallampati: III TM Distance: >3 FB Neck ROM: Full    Dental  (+) Teeth Intact and Dental Advisory Given   Pulmonary  breath sounds clear to auscultation        Cardiovascular Rate:Normal     Neuro/Psych    GI/Hepatic   Endo/Other    Renal/GU      Musculoskeletal   Abdominal   Peds  Hematology   Anesthesia Other Findings   Reproductive/Obstetrics                           Anesthesia Physical Anesthesia Plan  ASA: III  Anesthesia Plan: General   Post-op Pain Management:    Induction: Intravenous  Airway Management Planned: Oral ETT  Additional Equipment:   Intra-op Plan:   Post-operative Plan: Extubation in OR  Informed Consent: I have reviewed the patients History and Physical, chart, labs and discussed the procedure including the risks, benefits and alternatives for the proposed anesthesia with the patient or authorized representative who has indicated his/her understanding and acceptance.   Dental advisory given  Plan Discussed with: CRNA and Anesthesiologist  Anesthesia Plan Comments: (HNP L4-5 L5-S1 chronic low back pain Mild asthma)        Anesthesia Quick Evaluation

## 2013-07-29 NOTE — Op Note (Signed)
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 Lumbar stenosis Procedure: Laminectomy L5 bilateral laminotomies at L4-5 decompression of L4-L5 and S1 nerve roots individually more work than require for simple interbody technique. Posterior interbody arthrodesis using peek spacers local autograft and allograft pedicle screw fixation L4-L5 and the sacrum with posterior lateral arthrodesis using local autograft and allograft. Surgeon: Barnett Abu Assistant: Daryl Eastern Anesthesia: General endotracheal Indications: The patient is a 57 year old individual who's had significant back and bilateral lower extremity pain and more recently weakness in his right leg he has advanced spondylitic disease at L4-5 and L5-S1 is been advised regarding the need for surgical decompression and stabilization of both these joint levels. This taken to the operating room at this time.  Procedure: The patient was brought to the operating room supine on a stretcher. After the smooth induction of general endotracheal anesthesia, he was turned prone onto the operating table. The back was shaved prepped with alcohol and DuraPrep and draped in a sterile fashion. Midline incision was created through the previous scar and this was carried down to the lumbar dorsal fascia. Fascia was opened on either side of the midline to expose the interlaminar space is of L4-5 and L5-S1. On the left side at L5-S1 there was noted to be a previous laminotomy. Care was taken to protect the dura in this region. Dissection was then undertaken at L4-L5 where radical laminotomy was performed removing the entire inferior facet complex at L4-L5. This exposed the common dural tube. This was decompressed and the take off of the L4 nerve root superiorly was then found to be stenotic in the foramen and this was a room believed with a  2 and 3 mm Kerrison punch the L5 nerve root is similarly found to be significantly stenotic as it entered the foramen and this was decompressed with a combination of curettes and rongeurs and gentle use of a high-speed drill nonetheless during this procedure a small inadvertent durotomy occurred on the side of the dural tube. This was oversewn with a singular figure-of-eight 6-0 Prolene suture and was found to be watertight. The L5 nerve root was then decompressed fully in its length of the foramen. The S1 nerve root was decompressed using a combination of curettes and rongeurs to remove some overgrown bone from its bony foramen. The same decompression was performed on the right-sided nerve roots with attention given to each one as it had been on left. Then posterior interbody decompression and arthrodesis was performed by performing a total discectomy as possible L4-L5 and a total discectomy at L5-S1 and L4-L5 12 mm peek spacers were able to be placed into the disc space this was after adequate decompression of the disc space had been performed using a combination of curettes and rongeurs and the endplates were completely rongeured away. The autograft was packed into the disc space along with the peek spacers. At L5-S1 the disc space was noted to be much tighter. Here is able to place 9 mm peek spacers into the interspace with some remnants of the bone that is harvested from the lamina also placed into the disc space.  Attention was then turned to the lateral gutters these were decorticated and packed with combination of autograft and allograft this was done from L4-L5 into the sacral ala bilaterally using Cobb elevators and a high-speed drill to decorticate adequately. The patient had a very steep pelvis which made this particular  task difficult. Nonetheless a good amount of bone graft was placed into these lateral gutters.  Pedicle entry sites were then chosen at L4-L5 and the sacrum using 6.5 mm tap the tap  holes that were first sounded with an awl. Each of the screws was noted to be in good position without any evidence of cutout. Then 70 mm rods were used to connect between the screw heads from L4 to the sacrum. Attention was then turned to the common dural tube and he chose the L4-L5 and S1 nerve roots these were found to be well decompressed. Hemostasis in the soft tissues was meticulously obtained, and then the wound was closed with #1 Vicryl in the lumbar dorsal fascia. 2-0 Vicryl was used in the subcutaneous tissues. 3-0 Vicryl was used as a subcuticular skin. Dermabond was placed on the skin. Patient had about 1700 cc of blood loss he received 750 cc of Cell Saver blood, and use returned to the recovery room in stable condition.

## 2013-07-29 NOTE — Anesthesia Postprocedure Evaluation (Signed)
  Anesthesia Post-op Note  Patient: Ronald Parsons  Procedure(s) Performed: Procedure(s) with comments: POSTERIOR LUMBAR FUSION 2 LEVEL (N/A) - L4-5 L5-S1 Posterior lumbar interbody fusion  Patient Location: PACU  Anesthesia Type:General  Level of Consciousness: awake, alert  and oriented  Airway and Oxygen Therapy: Patient Spontanous Breathing and Patient connected to face mask oxygen  Post-op Pain: mild  Post-op Assessment: Post-op Vital signs reviewed  Post-op Vital Signs: Reviewed  Complications: No apparent anesthesia complications

## 2013-07-30 LAB — CBC
Hemoglobin: 14.3 g/dL (ref 13.0–17.0)
MCH: 31.1 pg (ref 26.0–34.0)
MCHC: 33.4 g/dL (ref 30.0–36.0)
MCV: 93 fL (ref 78.0–100.0)
Platelets: 146 10*3/uL — ABNORMAL LOW (ref 150–400)
RBC: 4.6 MIL/uL (ref 4.22–5.81)
WBC: 13.1 10*3/uL — ABNORMAL HIGH (ref 4.0–10.5)

## 2013-07-30 LAB — BASIC METABOLIC PANEL
BUN: 20 mg/dL (ref 6–23)
CO2: 20 mEq/L (ref 19–32)
Calcium: 8 mg/dL — ABNORMAL LOW (ref 8.4–10.5)
Chloride: 97 mEq/L (ref 96–112)
Creatinine, Ser: 1.3 mg/dL (ref 0.50–1.35)
GFR calc Af Amer: 69 mL/min — ABNORMAL LOW (ref >90)
GFR calc non Af Amer: 59 mL/min — ABNORMAL LOW (ref >90)
Glucose, Bld: 189 mg/dL — ABNORMAL HIGH (ref 70–99)

## 2013-07-30 MED ORDER — DIAZEPAM 5 MG PO TABS
5.0000 mg | ORAL_TABLET | Freq: Four times a day (QID) | ORAL | Status: DC | PRN
  Administered 2013-07-30 – 2013-08-01 (×8): 5 mg via ORAL
  Filled 2013-07-30 (×8): qty 1

## 2013-07-30 MED ORDER — INFLUENZA VAC SPLIT QUAD 0.5 ML IM SUSP
0.5000 mL | INTRAMUSCULAR | Status: AC
  Administered 2013-07-31: 0.5 mL via INTRAMUSCULAR
  Filled 2013-07-30: qty 0.5

## 2013-07-30 MED ORDER — MORPHINE SULFATE 4 MG/ML IJ SOLN
4.0000 mg | INTRAMUSCULAR | Status: DC | PRN
  Administered 2013-07-30 – 2013-08-01 (×12): 4 mg via INTRAVENOUS
  Filled 2013-07-30 (×12): qty 1

## 2013-07-30 MED ORDER — KETOROLAC TROMETHAMINE 15 MG/ML IJ SOLN
15.0000 mg | Freq: Four times a day (QID) | INTRAMUSCULAR | Status: AC
  Administered 2013-07-30 – 2013-07-31 (×5): 15 mg via INTRAVENOUS
  Filled 2013-07-30 (×5): qty 1

## 2013-07-30 NOTE — Progress Notes (Signed)
   CARE MANAGEMENT NOTE 07/30/2013  Patient:  Ronald Parsons, Ronald Parsons   Account Number:  [id number]  Date Initiated:  07/30/2013  Documentation initiated by:  Jiles Crocker  Subjective/Objective Assessment:   ADMITTED FOR SURGERY- Laminectomy L5 bilateral laminotomies     Action/Plan:   CM FOLLOWING FOR DCP   Anticipated DC Date:  08/06/2013   Anticipated DC Plan:  POSSIBLY HOME W HOME HEALTH SERVICES AWAITING ON PT/OT EVALS/ RECOMMENDATIONS FOR DISCHARGE;   DC Planning Services  CM consult        Status of service:  In process, will continue to follow Medicare Important Message given?  NA - LOS <3 / Initial given by admissions (If response is "NO", the following Medicare IM given date fields will be blank)  Per UR Regulation:  Reviewed for med. necessity/level of care/duration of stay Comments:  10/7/2014Abelino Derrick RN,BSN,MHA [phone]

## 2013-07-30 NOTE — Progress Notes (Signed)
Subjective: Patient reports Complains of significant back pain, with the law did not been very effective for him. Also notes that Robaxin doesn't seem to be working  Objective: Vital signs in last 24 hours: Temp:  [97.7 F (36.5 C)-98.2 F (36.8 C)] 97.7 F (36.5 C) (10/07 1059) Pulse Rate:  [71-116] 84 (10/07 1059) Resp:  [15-25] 20 (10/07 0700) BP: (129-181)/(81-119) 129/81 mmHg (10/07 1059) SpO2:  [94 %-100 %] 97 % (10/07 1059)  Intake/Output from previous day: 10/06 0701 - 10/07 0700 In: 3750 [I.V.:3000; Blood:750] Out: 2525 [Urine:775; Blood:1750] Intake/Output this shift: Total I/O In: 540 [P.O.:540] Out: 4 [Urine:4]  Incision is clean and dry motor function appears intact.  Lab Results:  Recent Labs  07/30/13 0610  WBC 13.1*  HGB 14.3  HCT 42.8  PLT 146*   BMET  Recent Labs  07/30/13 0610  NA 132*  K 4.3  CL 97  CO2 20  GLUCOSE 189*  BUN 20  CREATININE 1.30  CALCIUM 8.0*    Studies/Results: Dg Lumbar Spine 2-3 Views  07/30/2013   *RADIOLOGY REPORT*  Clinical Data: Posterior lumbar spinal fusion at L4-S1.  LUMBAR SPINE - 2-3 VIEW  Comparison:  Lumbar spine radiographs performed 07/11/2013  Findings: Two fluoroscopic C-arm images are provided from the OR. These demonstrate placement of posterior lumbar spinal fusion hardware at L4-S1, with associated spacers and decompression. Vertebral bodies demonstrate normal height and alignment.  IMPRESSION:  Status post lumbar spinal fusion at L4-S1; vertebral bodies demonstrate normal height and alignment.   Original Report Authenticated By: Tonia Ghent, M.D.   Dg Lumbar Spine 1 View  07/29/2013   *RADIOLOGY REPORT*  Clinical Data: L4-5 and L5-S1 PLIF.  OPERATIVE LUMBAR SPINE - 1 VIEW  Comparison: Lumbar spine x-rays 07/11/2013 [facility].  MRI lumbar spine 07/04/2013.  Findings: Single cross-table lateral image obtained at 1734 hours and submitted for interpretation post-operatively  demonstrates clamps on the spinous processes of L4 and L5.  IMPRESSION: L4 and L5 localized intraoperatively.   Original Report Authenticated By: Hulan Saas, M.D.   Dg C-arm 1-60 Min  07/30/2013   *RADIOLOGY REPORT*  Clinical Data: Posterior lumbar spinal fusion at L4-S1.  LUMBAR SPINE - 2-3 VIEW  Comparison:  Lumbar spine radiographs performed 07/11/2013  Findings: Two fluoroscopic C-arm images are provided from the OR. These demonstrate placement of posterior lumbar spinal fusion hardware at L4-S1, with associated spacers and decompression. Vertebral bodies demonstrate normal height and alignment.  IMPRESSION:  Status post lumbar spinal fusion at L4-S1; vertebral bodies demonstrate normal height and alignment.   Original Report Authenticated By: Tonia Ghent, M.D.    Assessment/Plan: Stable postop day 1.  LOS: 1 day  Hep well IV, on oral Percocet and to continue Toradol IV. Observe overnight if stable may be discharged tomorrow.   Jacorian Golaszewski J 07/30/2013, 2:35 PM

## 2013-07-30 NOTE — Evaluation (Signed)
Occupational Therapy Evaluation Patient Details Name: Ronald Parsons MRN: [id number] DOB: [birth year] Today's Date: 07/30/2013 Time: 0930-1001 OT Time Calculation (min): 31 min  OT Assessment / Plan / Recommendation History of present illness 57 yo male s/p PLIF L4-5 L 5- S1 with back brace   Clinical Impression   Patient is s/p PLIF L4- S1 surgery resulting in functional limitations due to the deficits listed below (see OT problem list).  Patient will benefit from skilled OT acutely to increase independence and safety with ADLS to allow discharge home.     OT Assessment  Patient needs continued OT Services    Follow Up Recommendations  No OT follow up    Barriers to Discharge      Equipment Recommendations  Other (comment) (AE and toilet aid)    Recommendations for Other Services    Frequency  Min 2X/week    Precautions / Restrictions Precautions Precautions: Back Precaution Booklet Issued: Yes (comment) Precaution Comments: handout provided and reviewed in detail to ADLs with precautions Required Braces or Orthoses: Spinal Brace Spinal Brace: Applied in supine position;Lumbar corset   Pertinent Vitals/Pain 8 out 10 standing 9 out 10 sitting Rn student notified and pt repositioned in bed    ADL  Eating/Feeding: Independent Where Assessed - Eating/Feeding: Chair Grooming: Wash/dry hands;Wash/dry face;Supervision/safety Where Assessed - Grooming: Unsupported standing Upper Body Dressing: Supervision/safety Where Assessed - Upper Body Dressing: Unsupported sit to stand Lower Body Dressing: Supervision/safety Where Assessed - Lower Body Dressing: Unsupported sit to stand (Ae used and educated) Statistician: Radiographer, therapeutic Method: Sit to Barista: Regular height toilet (educated on toilet aids) Tub/Shower Transfer: Therapist, sports Method: Science writer: Other (comment)  (tub without DME) Equipment Used: Back brace;Other (comment) (pushing IV pole) Transfers/Ambulation Related to ADLs: Pt ambulated supervision level with IV pole  ADL Comments: Pt educated on AE, toilet aids, tub transfer, precautions with adls and bed mobility,. Mother and sister present throughout education. Pt concerned iwth bed mobility at d/c. Pt and family plan to purchase AE and toilet aid from gift shop prior to d/c home.    OT Diagnosis: Generalized weakness;Acute pain  OT Problem List: Decreased strength;Decreased activity tolerance;Impaired balance (sitting and/or standing);Decreased knowledge of precautions;Decreased knowledge of use of DME or AE;Pain OT Treatment Interventions: Self-care/ADL training;Therapeutic exercise;DME and/or AE instruction;Therapeutic activities;Patient/family education;Balance training   OT Goals(Current goals can be found in the care plan section) Acute Rehab OT Goals Patient Stated Goal: to return home and to working OT Goal Formulation: With patient Time For Goal Achievement: 08/13/13 Potential to Achieve Goals: Good ADL Goals Pt Will Perform Tub/Shower Transfer: Tub transfer;with modified independence;ambulating Additional ADL Goal #1: Pt will complete bed mobility MOD I with HOB flat  Visit Information  Last OT Received On: 07/30/13 Assistance Needed: +1 History of Present Illness: 57 yo male s/p PLIF L4-5 L 5- S1 with back brace       Prior Functioning     Home Living Family/patient expects to be discharged to:: Private residence Living Arrangements: Alone Available Help at Discharge: Family;Available PRN/intermittently Type of Home: House Home Access: Stairs to enter [employer] of Steps: 2 Entrance Stairs-Rails: None Home Layout: One level Home Equipment: None 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) Prior Function Level of Independence: Independent Communication Communication: No  difficulties Dominant Hand: Right         Vision/Perception Vision - History Baseline Vision: Wears glasses all the time Patient Visual  Report: No change from baseline   Cognition  Cognition Arousal/Alertness: Awake/alert Behavior During Therapy: WFL for tasks assessed/performed Overall Cognitive Status: Within Functional Limits for tasks assessed Memory: Decreased short-term memory (question due to medication)    Extremity/Trunk Assessment Upper Extremity Assessment Upper Extremity Assessment: Overall WFL for tasks assessed Lower Extremity Assessment Lower Extremity Assessment: Defer to PT evaluation Cervical / Trunk Assessment Cervical / Trunk Assessment: Normal     Mobility Bed Mobility Bed Mobility: Sit to Supine Rolling Left: 5: Supervision Left Sidelying to Sit: 4: Min assist Sitting - Scoot to Edge of Bed: 5: Supervision Sit to Supine: 4: Min assist;HOB flat Details for Bed Mobility Assistance: educated on sequence and positioning for home.  Transfers Transfers: Sit to Stand;Stand to Sit Sit to Stand: 5: Supervision;With upper extremity assist;From bed Stand to Sit: 5: Supervision;With upper extremity assist;To chair/3-in-1 Details for Transfer Assistance: pt plans to sit in chair with arms upon d/c home     Exercise     Balance High Level Balance High Level Balance Activites: Side stepping;Backward walking;Direction changes;Turns High Level Balance Comments: supervision   End of Session OT - End of Session Activity Tolerance: Patient tolerated treatment well Patient left: in bed;with call bell/phone within reach;with family/visitor present Nurse Communication: Mobility status;Precautions  GO     Ronald, Parsons 07/30/2013, 10:18 AM Pager: [phone]

## 2013-07-30 NOTE — Evaluation (Signed)
Physical Therapy Evaluation Patient Details Name: Ronald Parsons MRN: [id number] DOB: 13-Feb-1956 Today's Date: 07/30/2013 Time: 7829-5621 PT Time Calculation (min): 35 min  PT Assessment / Plan / Recommendation History of Present Illness  57 yo male s/p PLIF L4-5 L 5- S1 with back brace  Clinical Impression  Patient demonstrates deficits in functional mobility as indicated below. Patient will benefit from continued skilled PT to address deficits and maximize independence. Will continue to work with patient as indicated and progress activity as tolerated.  Patient limited by pain at this time.    PT Assessment  Patient needs continued PT services    Follow Up Recommendations  Supervision/Assistance - PRN       Barriers to Discharge Decreased caregiver support      Equipment Recommendations  None recommended by PT       Frequency Min 4X/week    Precautions / Restrictions Precautions Precautions: Back Precaution Booklet Issued: Yes (comment) Precaution Comments: handout provided and reviewed in detail to ADLs with precautions Required Braces or Orthoses: Spinal Brace Spinal Brace: Applied in supine position;Lumbar corset   Pertinent Vitals/Pain 10/10 pain reported despite pre-medication      Mobility  Bed Mobility Bed Mobility: Rolling Left;Left Sidelying to Sit;Sitting - Scoot to Edge of Bed Rolling Left: 5: Supervision Left Sidelying to Sit: 4: Min assist Sitting - Scoot to Edge of Bed: 5: Supervision Details for Bed Mobility Assistance: VCs for placing bed in flat position, log roll technique Transfers Transfers: Sit to Stand;Stand to Sit Sit to Stand: 4: Min assist;From bed;From chair/3-in-1;From toilet Stand to Sit: 4: Min guard;To bed;To chair/3-in-1;To toilet Details for Transfer Assistance: VCs for precatuion compliance when sitting, assist for stability during standing Ambulation/Gait Ambulation/Gait Assistance: 5: Supervision Ambulation Distance (Feet):  340 Feet Assistive device:  (pushing IV pole) Ambulation/Gait Assistance Details: no physical assist needed, VCs for upright posture with ambulation Gait Pattern: Step-through pattern Gait velocity: decreased General Gait Details: steady with ambulation despite increased pain Stairs: Yes Stairs Assistance: 5: Supervision;4: Min guard Stairs Assistance Details (indicate cue type and reason): educated patient on step to sequencing Stair Management Technique: No rails;One rail Right;Step to pattern;Forwards Number of Stairs: 3        PT Diagnosis: Difficulty walking;Acute pain  PT Problem List: Decreased range of motion;Decreased activity tolerance;Decreased mobility;Pain PT Treatment Interventions: DME instruction;Stair training;Functional mobility training;Therapeutic activities;Therapeutic exercise;Balance training;Patient/family education     PT Goals(Current goals can be found in the care plan section) Acute Rehab PT Goals Patient Stated Goal: to go home PT Goal Formulation: With patient Time For Goal Achievement: 08/13/13 Potential to Achieve Goals: Good  Visit Information  Last PT Received On: 07/30/13 Assistance Needed: +1 History of Present Illness: 57 yo male s/p PLIF L4-5 L 5- S1 with back brace       Prior Functioning  Home Living Family/patient expects to be discharged to:: Private residence Living Arrangements: Alone Available Help at Discharge: Family;Available PRN/intermittently Type of Home: House Home Access: Stairs to enter [employer] of Steps: 2 Entrance Stairs-Rails: None Home Layout: One level Home Equipment: None Prior Function Level of Independence: Independent Communication Communication: No difficulties    Cognition  Cognition Arousal/Alertness: Awake/alert Behavior During Therapy: WFL for tasks assessed/performed    Extremity/Trunk Assessment Upper Extremity Assessment Upper Extremity Assessment: Defer to OT evaluation Lower  Extremity Assessment Lower Extremity Assessment: Overall WFL for tasks assessed   Balance High Level Balance High Level Balance Activites: Side stepping;Backward walking;Direction changes;Turns High Level Balance Comments:  supervision  End of Session PT - End of Session Equipment Utilized During Treatment: Gait belt;Back brace Activity Tolerance: Patient limited by pain Patient left: Other (comment) (with occupational therapist in therapy gym) Nurse Communication: Mobility status  GP     Fabio Asa 07/30/2013, 10:12 AM Charlotte Crumb, PT DPT  [phone]

## 2013-07-31 DIAGNOSIS — D62 Acute posthemorrhagic anemia: Secondary | ICD-10-CM

## 2013-07-31 MED ORDER — DEXAMETHASONE 1 MG PO TABS: ORAL_TABLET | ORAL | Status: DC

## 2013-07-31 MED ORDER — DEXAMETHASONE 2 MG PO TABS
2.0000 mg | ORAL_TABLET | Freq: Two times a day (BID) | ORAL | Status: DC
  Administered 2013-07-31 – 2013-08-01 (×2): 2 mg via ORAL
  Filled 2013-07-31 (×3): qty 1

## 2013-07-31 MED FILL — Heparin Sodium (Porcine) Inj 1000 Unit/ML: INTRAMUSCULAR | Qty: 30 | Status: AC

## 2013-07-31 MED FILL — Sodium Chloride Irrigation Soln 0.9%: Qty: 3000 | Status: AC

## 2013-07-31 MED FILL — Sodium Chloride IV Soln 0.9%: INTRAVENOUS | Qty: 1000 | Status: AC

## 2013-07-31 NOTE — Progress Notes (Signed)
Subjective: Patient reports significant pain poorly controlled with medication requesting and requiring IV morphine  Objective: Vital signs in last 24 hours: Temp:  [97.5 F (36.4 C)-98.2 F (36.8 C)] 98.1 F (36.7 C) (10/08 1511) Pulse Rate:  [71-88] 83 (10/08 1511) Resp:  [18-20] 18 (10/08 1511) BP: (120-151)/(63-77) 130/66 mmHg (10/08 1511) SpO2:  [98 %-99 %] 99 % (10/08 1511)  Intake/Output from previous day: 10/07 0701 - 10/08 0700 In: 1410 [P.O.:1410] Out: 465 [Urine:465] Intake/Output this shift:    incision is clean and dry his motor function is good dressing has been removed.  Lab Results:  Recent Labs  07/30/13 0610  WBC 13.1*  HGB 14.3  HCT 42.8  PLT 146*   BMET  Recent Labs  07/30/13 0610  NA 132*  K 4.3  CL 97  CO2 20  GLUCOSE 189*  BUN 20  CREATININE 1.30  CALCIUM 8.0*    Studies/Results: Dg Lumbar Spine 2-3 Views  07/30/2013   *RADIOLOGY REPORT*  Clinical Data: Posterior lumbar spinal fusion at L4-S1.  LUMBAR SPINE - 2-3 VIEW  Comparison:  Lumbar spine radiographs performed 07/11/2013  Findings: Two fluoroscopic C-arm images are provided from the OR. These demonstrate placement of posterior lumbar spinal fusion hardware at L4-S1, with associated spacers and decompression. Vertebral bodies demonstrate normal height and alignment.  IMPRESSION:  Status post lumbar spinal fusion at L4-S1; vertebral bodies demonstrate normal height and alignment.   Original Report Authenticated By: Tonia Ghent, M.D.   Dg C-arm 1-60 Min  07/30/2013   *RADIOLOGY REPORT*  Clinical Data: Posterior lumbar spinal fusion at L4-S1.  LUMBAR SPINE - 2-3 VIEW  Comparison:  Lumbar spine radiographs performed 07/11/2013  Findings: Two fluoroscopic C-arm images are provided from the OR. These demonstrate placement of posterior lumbar spinal fusion hardware at L4-S1, with associated spacers and decompression. Vertebral bodies demonstrate normal height and alignment.  IMPRESSION:   Status post lumbar spinal fusion at L4-S1; vertebral bodies demonstrate normal height and alignment.   Original Report Authenticated By: Tonia Ghent, M.D.    Assessment/Plan: Moving slowly postop day 2 from 2 level decompression and fusion. Patient had acute blood loss anemia with 1700 cc blood loss and Cell Saver blood being returned to use.   LOS: 2 days  encourage ambulation Will dose with Decadron for several days and discharged with Dosepak patient will be seen in followup by Dr. Wynetta Emery in my absence.   Nuha Degner J 07/31/2013, 7:25 PM

## 2013-07-31 NOTE — Progress Notes (Signed)
Physical Therapy Treatment Patient Details Name: SOPHIE QUILES MRN: [id number] DOB: 15-May-1956 Today's Date: 07/31/2013 Time: 0812-0838 PT Time Calculation (min): 26 min  PT Assessment / Plan / Recommendation  History of Present Illness 57 yo male s/p PLIF L4-5 L 5- S1 with back brace   PT Comments   Patient ambulating independently despite increased pain this am. Patient able to perform bed mobility without cues and demonstrates improved compliance with back precautions. Patient performed stair negotiation x2 without difficulty and has been ambulating on his own independently. Spoke with patient regarding expectations for mobility upon discharge. At this time, patient demonstrates no acute PT needs. Will d/c.  Follow Up Recommendations  Supervision - Intermittent           Equipment Recommendations  None recommended by PT       Frequency Min 4X/week   Progress towards PT Goals Progress towards PT goals: Goals met/education completed, patient discharged from PT  Plan Current plan remains appropriate    Precautions / Restrictions Precautions Precautions: Back Precaution Booklet Issued: Yes (comment) Precaution Comments: reinforced education for back precautions Required Braces or Orthoses: Spinal Brace Spinal Brace: Applied in sitting position Restrictions Weight Bearing Restrictions: No   Pertinent Vitals/Pain 6/10    Mobility  Bed Mobility Bed Mobility: Rolling Left;Left Sidelying to Sit;Sitting - Scoot to [place] of Bed Rolling Left: 6: Modified independent (Device/Increase time) Left Sidelying to Sit: 6: Modified independent (Device/Increase time) Sitting - Scoot to Edge of Bed: 6: Modified independent (Device/Increase time) Details for Bed Mobility Assistance: Increased time to perform 2/2 increased pain Transfers Transfers: Sit to Stand;Stand to Sit Sit to Stand: 6: Modified independent (Device/Increase time) Stand to Sit: 6: Modified independent (Device/Increase  time) Details for Transfer Assistance: increased time Ambulation/Gait Ambulation/Gait Assistance: 7: Independent Ambulation Distance (Feet): 400 Feet Assistive device: None Gait Pattern: Step-through pattern Gait velocity: decreased General Gait Details: steady with ambulation despite increased pain Stairs: Yes Stairs Assistance: 6: Modified independent (Device/Increase time) Stairs Assistance Details (indicate cue type and reason): with rails, cues for sequencing and step to, performed both sideways and forwards Stair Management Technique: One rail Right;Step to pattern;Sideways;Forwards Number of Stairs: 4      PT Goals (current goals can now be found in the care plan section) Acute Rehab PT Goals Patient Stated Goal: to go home PT Goal Formulation: With patient Time For Goal Achievement: 08/13/13 Potential to Achieve Goals: Good  Visit Information  Last PT Received On: 07/31/13 Assistance Needed: +1 History of Present Illness: 57 yo male s/p PLIF L4-5 L 5- S1 with back brace    Subjective Data  Subjective: I didnt know to ask for the good pain medicine so it was a bad night Patient Stated Goal: to go home   Cognition  Cognition Arousal/Alertness: Awake/alert Behavior During Therapy: WFL for tasks assessed/performed Memory: Decreased short-term memory (question due to medication)    Balance  High Level Balance High Level Balance Activites: Side stepping;Backward walking;Direction changes;Turns High Level Balance Comments: independent  End of Session PT - End of Session Equipment Utilized During Treatment: Gait belt;Back brace Activity Tolerance: Patient limited by pain Patient left: in bed;with call bell/phone within reach Nurse Communication: Mobility status   GP     Fabio Asa 07/31/2013, 8:39 AM Charlotte Crumb, PT DPT  [phone]

## 2013-07-31 NOTE — Progress Notes (Signed)
Occupational Therapy Treatment Patient Details Name: Ronald Parsons MRN: [id number] DOB: 31-Mar-1956 Today's Date: 07/31/2013 Time: 4098-1191 OT Time Calculation (min): 18 min  OT Assessment / Plan / Recommendation  History of present illness 57 yo male s/p PLIF L4-5 L 5- S1 with back brace   OT comments  Pt wanted to review showering and whole technique for doing so. Discussed getting into truck. Reviewed bed mobility.   Follow Up Recommendations  No OT follow up    Barriers to Discharge       Equipment Recommendations  Other (comment) (AE and toilet aid)    Recommendations for Other Services    Frequency Min 2X/week   Progress towards OT Goals Progress towards OT goals: Progressing toward goals  Plan Discharge plan remains appropriate    Precautions / Restrictions Precautions Precautions: Back Precaution Booklet Issued: Yes (comment) (had one in room) Precaution Comments: reinforced education for back precautions Required Braces or Orthoses: Spinal Brace Spinal Brace: Applied in sitting position Restrictions Weight Bearing Restrictions: No   Pertinent Vitals/Pain Pain in back, not rated. Verbalized he had recently received pain meds.     ADL  Toilet Transfer: Modified independent Toilet Transfer Method: Sit to Barista: Bedside commode Equipment Used: Back brace;Long-handled sponge Transfers/Ambulation Related to ADLs: Supervision for ambulation and Mod I for transfers. ADL Comments: Pt verbalized he wanted to go over how to shower. Pt walked in bathroom and sat on 3 in 1 (said he felt good about stepping over tub-practiced that tomorrow). Went over using long handled sponge and technique for showering. Discussed getting into truck. Cues for precautions during session.  Recommended family picking up rugs in house.    OT Diagnosis:    OT Problem List:   OT Treatment Interventions:     OT Goals(current goals can now be found in the care plan  section) Acute Rehab OT Goals Patient Stated Goal: not stated OT Goal Formulation: With patient Time For Goal Achievement: 08/13/13 Potential to Achieve Goals: Good ADL Goals Pt Will Perform Tub/Shower Transfer: Tub transfer;with modified independence;ambulating Additional ADL Goal #1: Pt will complete bed mobility MOD I with HOB flat  Visit Information  Last OT Received On: 07/31/13 Assistance Needed: +1 History of Present Illness: 57 yo male s/p PLIF L4-5 L 5- S1 with back brace    Subjective Data      Prior Functioning       Cognition  Cognition Arousal/Alertness: Awake/alert Behavior During Therapy: WFL for tasks assessed/performed Overall Cognitive Status: Within Functional Limits for tasks assessed Memory: Decreased short-term memory (question due to medication)    Mobility  Bed Mobility Bed Mobility: Sit to Sidelying Left;Rolling Right Rolling Right: 4: Min guard (tactile cue) Sit to Sidelying Left: 6: Modified independent (Device/Increase time) Details for Bed Mobility Assistance: Cues to maintain precuautions when rolling onto back.  Transfers Transfers: Sit to Stand;Stand to Sit Sit to Stand: 6: Modified independent (Device/Increase time) Stand to Sit: 6: Modified independent (Device/Increase time) Details for Transfer Assistance: increased time    Exercises         End of Session OT - End of Session Equipment Utilized During Treatment: Back brace Activity Tolerance: Patient tolerated treatment well Patient left: in bed;with call bell/phone within reach  GO     Ronald Parsons OTR/L [phone] 07/31/2013, 11:28 AM

## 2013-07-31 NOTE — Progress Notes (Signed)
Referred to this CSW today for ?SNF. Chart reviewed and have noted PT recommending no follow up or SNF needs. Discussed with RNCM and  Care Coordinator.   CSW to sign off- please contact us if SW needs arise. Reece Levy, MSW [phone]

## 2013-07-31 NOTE — Progress Notes (Signed)
Physical Therapy Discharge Patient Details Name: Ronald Parsons MRN: [id number] DOB: [birth year] Today's Date: 07/31/2013 Time: 0347-4259 PT Time Calculation (min): 26 min  Patient discharged from PT services secondary to goals met and no further PT needs identified.  Please see latest therapy progress note for current level of functioning and progress toward goals.    Progress and discharge plan discussed with patient and/or caregiver: Patient/Caregiver agrees with plan  GP     Fabio Asa 07/31/2013, 8:41 AM

## 2013-08-01 MED ORDER — METHYLPREDNISOLONE (PAK) 4 MG PO TABS: ORAL_TABLET | ORAL | Status: DC

## 2013-08-01 MED ORDER — DIAZEPAM 5 MG PO TABS: 10.0000 mg | ORAL_TABLET | Freq: Four times a day (QID) | ORAL | Status: DC | PRN

## 2013-08-01 MED ORDER — OXYCODONE-ACETAMINOPHEN 5-325 MG PO TABS: 1.0000 | ORAL_TABLET | ORAL | Status: DC | PRN

## 2013-08-01 NOTE — Progress Notes (Signed)
Patient discharged home with family member. Scripts and lumbar surgery instructions given to patient and family member for home safety precautions. RN notified patient to call MD and set up appointment. Patient and family stated understanding of discharged instruction given. Aisha RN

## 2013-08-01 NOTE — Progress Notes (Signed)
Patient ID: Ronald Parsons, male   DOB: 1956/08/01, 57 y.o.   MRN: [id number]  patient doing great discharge home

## 2013-08-01 NOTE — Plan of Care (Signed)
Problem: Consults Goal: Diagnosis - Spinal Surgery Outcome: Completed/Met Date Met:  08/01/13 Thoraco/Lumbar Spine Fusion     

## 2013-08-01 NOTE — Discharge Summary (Signed)
  Physician Discharge Summary  Patient ID: Ronald Parsons MRN: [id number] DOB/AGE: 1955/11/20 57 y.o.  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 He will be discharged on pain medication and muscle relaxers as well as a steroid pack  Consults: Significant Diagnostic Studies: Treatments: Lumbar decompression and fusion Discharge Exam: Blood pressure 99/59, pulse 84, temperature 99.7 F (37.6 C), temperature source Oral, resp. rate 19, height 5\' 9"  (1.753 m), weight 108.863 kg (240 lb), SpO2 96.00%. Strength out of 5 wound clean and dry  Disposition: Home     Medication List         aspirin 325 MG tablet  Take 650 mg by mouth daily as needed for pain.     cetirizine-pseudoephedrine 5-120 MG per tablet  Commonly known as:  ZYRTEC-D  Take 1 tablet by mouth 2 (two) times daily as needed for allergies.     dexamethasone 1 MG tablet  Commonly known as:  DECADRON  2 tablets twice daily for 2 days, one tablet twice daily for 2 days, one tablet daily for 2 days.     diazepam 5 MG tablet  Commonly known as:  VALIUM  Take 2 tablets (10 mg total) by mouth every 6 (six) hours as needed for anxiety.     Fish Oil 300 MG Caps  Take 600 mg by mouth daily.     multivitamin with minerals tablet  Take 1 tablet by mouth daily.     oxyCODONE-acetaminophen 5-325 MG per tablet  Commonly known as:  PERCOCET/ROXICET  Take 0.5 tablets by mouth every 3 (three)  hours as needed for pain.     oxyCODONE-acetaminophen 5-325 MG per tablet  Commonly known as:  PERCOCET/ROXICET  Take 1-2 tablets by mouth every 4 (four) hours as needed.     testosterone cypionate 200 MG/ML injection  Commonly known as:  DEPOTESTOTERONE CYPIONATE  Inject into the muscle every 21 ( twenty-one) days.     zolpidem 5 MG tablet  Commonly known as:  AMBIEN  Take 5 mg by mouth at bedtime as needed for sleep.           Follow-up Information   Follow up with Stefani Dama, MD.   Specialty:  Neurosurgery   Contact information:   [address] [place] [address] [phone]       Signed: Mariam Dollar 08/01/2013, 7:31 AM

## 2013-09-26 ENCOUNTER — Other Ambulatory Visit: Payer: Self-pay | Admitting: Neurological Surgery

## 2013-09-26 DIAGNOSIS — M5416 Radiculopathy, lumbar region: Secondary | ICD-10-CM

## 2013-10-02 ENCOUNTER — Ambulatory Visit
Admission: RE | Admit: 2013-10-02 | Discharge: 2013-10-02 | Disposition: A | Discharge status: Home / Self Care | Payer: BC Managed Care – PPO | Source: Ambulatory Visit | Attending: Neurological Surgery | Admitting: Neurological Surgery

## 2013-10-02 DIAGNOSIS — M5416 Radiculopathy, lumbar region: Secondary | ICD-10-CM

## 2014-07-10 ENCOUNTER — Other Ambulatory Visit: Payer: Self-pay | Admitting: Neurological Surgery

## 2014-07-10 DIAGNOSIS — M545 Low back pain, unspecified: Secondary | ICD-10-CM

## 2014-07-17 ENCOUNTER — Other Ambulatory Visit: Payer: Self-pay | Admitting: Neurological Surgery

## 2014-07-17 ENCOUNTER — Other Ambulatory Visit: Payer: BC Managed Care – PPO

## 2014-07-17 ENCOUNTER — Ambulatory Visit
Admission: RE | Admit: 2014-07-17 | Discharge: 2014-07-17 | Disposition: A | Discharge status: Home / Self Care | Payer: BC Managed Care – PPO | Source: Ambulatory Visit | Attending: Neurological Surgery | Admitting: Neurological Surgery

## 2014-07-17 DIAGNOSIS — M545 Low back pain, unspecified: Secondary | ICD-10-CM

## 2014-10-29 ENCOUNTER — Other Ambulatory Visit: Payer: Self-pay | Admitting: Physical Medicine and Rehabilitation

## 2014-10-29 DIAGNOSIS — M546 Pain in thoracic spine: Secondary | ICD-10-CM

## 2014-11-11 ENCOUNTER — Ambulatory Visit
Admission: RE | Admit: 2014-11-11 | Discharge: 2014-11-11 | Disposition: A | Discharge status: Home / Self Care | Payer: BLUE CROSS/BLUE SHIELD | Source: Ambulatory Visit | Attending: Physical Medicine and Rehabilitation | Admitting: Physical Medicine and Rehabilitation

## 2014-11-11 DIAGNOSIS — M546 Pain in thoracic spine: Secondary | ICD-10-CM

## 2014-11-13 ENCOUNTER — Other Ambulatory Visit: Payer: Self-pay | Admitting: Physical Medicine and Rehabilitation

## 2014-11-13 DIAGNOSIS — M546 Pain in thoracic spine: Secondary | ICD-10-CM

## 2014-11-18 ENCOUNTER — Ambulatory Visit
Admission: RE | Admit: 2014-11-18 | Discharge: 2014-11-18 | Disposition: A | Discharge status: Home / Self Care | Payer: BLUE CROSS/BLUE SHIELD | Source: Ambulatory Visit | Attending: Physical Medicine and Rehabilitation | Admitting: Physical Medicine and Rehabilitation

## 2014-11-18 ENCOUNTER — Other Ambulatory Visit: Payer: BLUE CROSS/BLUE SHIELD

## 2014-11-18 DIAGNOSIS — M546 Pain in thoracic spine: Secondary | ICD-10-CM

## 2016-11-28 ENCOUNTER — Ambulatory Visit: Admit: 2016-11-28 | Payer: BLUE CROSS/BLUE SHIELD | Admitting: Ophthalmology

## 2016-11-28 ENCOUNTER — Encounter (INDEPENDENT_AMBULATORY_CARE_PROVIDER_SITE_OTHER): Payer: Medicare Other | Admitting: Ophthalmology

## 2016-11-28 DIAGNOSIS — H35033 Hypertensive retinopathy, bilateral: Secondary | ICD-10-CM | POA: Diagnosis not present

## 2016-11-28 DIAGNOSIS — I10 Essential (primary) hypertension: Secondary | ICD-10-CM | POA: Diagnosis not present

## 2016-11-28 DIAGNOSIS — H43813 Vitreous degeneration, bilateral: Secondary | ICD-10-CM

## 2016-11-28 DIAGNOSIS — H20012 Primary iridocyclitis, left eye: Secondary | ICD-10-CM

## 2016-11-28 SURGERY — PARS PLANA VITRECTOMY 25 GAUGE FOR ENDOPHTHALMITIS
Anesthesia: General | Laterality: Right

## 2016-11-30 ENCOUNTER — Encounter (INDEPENDENT_AMBULATORY_CARE_PROVIDER_SITE_OTHER): Payer: Medicare Other | Admitting: Ophthalmology

## 2016-11-30 DIAGNOSIS — H43813 Vitreous degeneration, bilateral: Secondary | ICD-10-CM | POA: Diagnosis not present

## 2016-11-30 DIAGNOSIS — H20012 Primary iridocyclitis, left eye: Secondary | ICD-10-CM

## 2016-11-30 DIAGNOSIS — I10 Essential (primary) hypertension: Secondary | ICD-10-CM

## 2016-11-30 DIAGNOSIS — H35033 Hypertensive retinopathy, bilateral: Secondary | ICD-10-CM | POA: Diagnosis not present

## 2016-12-02 ENCOUNTER — Encounter (INDEPENDENT_AMBULATORY_CARE_PROVIDER_SITE_OTHER): Payer: Medicare Other | Admitting: Ophthalmology

## 2016-12-02 DIAGNOSIS — H20012 Primary iridocyclitis, left eye: Secondary | ICD-10-CM | POA: Diagnosis not present

## 2016-12-02 DIAGNOSIS — I10 Essential (primary) hypertension: Secondary | ICD-10-CM | POA: Diagnosis not present

## 2016-12-02 DIAGNOSIS — H3562 Retinal hemorrhage, left eye: Secondary | ICD-10-CM | POA: Diagnosis not present

## 2016-12-02 DIAGNOSIS — H43812 Vitreous degeneration, left eye: Secondary | ICD-10-CM

## 2016-12-02 DIAGNOSIS — H35033 Hypertensive retinopathy, bilateral: Secondary | ICD-10-CM | POA: Diagnosis not present

## 2016-12-06 ENCOUNTER — Encounter (INDEPENDENT_AMBULATORY_CARE_PROVIDER_SITE_OTHER): Payer: Medicare Other | Admitting: Ophthalmology

## 2016-12-06 DIAGNOSIS — I10 Essential (primary) hypertension: Secondary | ICD-10-CM | POA: Diagnosis not present

## 2016-12-06 DIAGNOSIS — H3562 Retinal hemorrhage, left eye: Secondary | ICD-10-CM | POA: Diagnosis not present

## 2016-12-06 DIAGNOSIS — H20012 Primary iridocyclitis, left eye: Secondary | ICD-10-CM | POA: Diagnosis not present

## 2016-12-06 DIAGNOSIS — H43812 Vitreous degeneration, left eye: Secondary | ICD-10-CM

## 2016-12-06 DIAGNOSIS — H35033 Hypertensive retinopathy, bilateral: Secondary | ICD-10-CM

## 2017-04-07 ENCOUNTER — Ambulatory Visit (INDEPENDENT_AMBULATORY_CARE_PROVIDER_SITE_OTHER): Payer: Medicare Other | Admitting: Family

## 2017-04-07 ENCOUNTER — Encounter (INDEPENDENT_AMBULATORY_CARE_PROVIDER_SITE_OTHER): Payer: Self-pay | Admitting: Family

## 2017-04-07 ENCOUNTER — Ambulatory Visit (INDEPENDENT_AMBULATORY_CARE_PROVIDER_SITE_OTHER): Payer: Medicare Other

## 2017-04-07 ENCOUNTER — Telehealth (INDEPENDENT_AMBULATORY_CARE_PROVIDER_SITE_OTHER): Payer: Self-pay | Admitting: *Deleted

## 2017-04-07 VITALS — Ht 69.0 in | Wt 240.0 lb

## 2017-04-07 DIAGNOSIS — M25561 Pain in right knee: Principal | ICD-10-CM

## 2017-04-07 DIAGNOSIS — M7541 Impingement syndrome of right shoulder: Secondary | ICD-10-CM

## 2017-04-07 DIAGNOSIS — G8929 Other chronic pain: Secondary | ICD-10-CM

## 2017-04-07 NOTE — Telephone Encounter (Signed)
Error

## 2017-04-08 DIAGNOSIS — M25561 Pain in right knee: Secondary | ICD-10-CM

## 2017-04-08 DIAGNOSIS — M7541 Impingement syndrome of right shoulder: Secondary | ICD-10-CM | POA: Diagnosis not present

## 2017-04-08 DIAGNOSIS — G8929 Other chronic pain: Secondary | ICD-10-CM | POA: Diagnosis not present

## 2017-04-08 MED ORDER — METHYLPREDNISOLONE ACETATE 40 MG/ML IJ SUSP
40.0000 mg | INTRAMUSCULAR | Status: AC | PRN
  Administered 2017-04-08: 40 mg via INTRA_ARTICULAR

## 2017-04-08 MED ORDER — LIDOCAINE HCL 1 % IJ SOLN
5.0000 mL | INTRAMUSCULAR | Status: AC | PRN
  Administered 2017-04-08: 5 mL

## 2017-04-08 NOTE — Progress Notes (Signed)
Office Visit Note   Patient: Ronald Parsons           Date of Birth: October 16, 1956           MRN: [id number] Visit Date: 04/07/2017              Requested by: Aida PufferLittle, James, MD [address] [place], [place] [id number] PCP: Aida PufferLittle, James, MD  Chief Complaint  Patient presents with  . Right Knee - Pain      HPI: The patient is a 61 year old gentleman who presents today complaining of a 6 week history of right knee pain. He denies any injury however does state that just earlier this week she felt and heard a pop which relieved some pain but however has had increased different medial pain since. He's tried Aleve for the with minimal relief. Complains of pain with extension and flexion. Having difficulty climbing up and down stairs and pivoting. No twisting injury of the knee however states with his painful. Has not been able to do much walking due to pain.  Also relates that he's had about a 6 month history of right shoulder pain this is been ongoing. Denies any injury. has pain with above head reaching forward reaching requesting steroid injection of the shoulder today. Must lift heavy things option for work was trying to lift something this week unable to do so due to pain  Assessment & Plan: Visit Diagnoses:  1. Chronic pain of right knee   2. Impingement syndrome of right shoulder     Plan: Depo-Medrol injection of the right knee as well as right shoulder today. He'll follow-up in 4 weeks. May need to consider MRI of both joints if pain continues.  Follow-Up Instructions: Return in about 4 weeks (around 05/05/2017), or if symptoms worsen or fail to improve.   Right Knee Exam   Tenderness  The patient is experiencing tenderness in the medial retinaculum.  Range of Motion  Extension: normal Right knee extension: Pain and stiffness with extension.  Flexion: normal   Tests  Varus: negative Valgus: negative  Other  Erythema: absent Swelling: none Other tests: no effusion  present   Right Shoulder Exam   Tenderness  The patient is experiencing tenderness in the acromion.  Range of Motion  The patient has normal right shoulder ROM.  Tests  Drop Arm: negative Impingement: positive  Other  Erythema: absent Pulse: present      Patient is alert, oriented, no adenopathy, well-dressed, normal affect, normal respiratory effort.   Imaging: No results found.  Labs: No results found for: HGBA1C, ESRSEDRATE, CRP, LABURIC, REPTSTATUS, GRAMSTAIN, CULT, LABORGA  Orders:  Orders Placed This Encounter  Procedures  . XR Knee 1-2 Views Right   No orders of the defined types were placed in this encounter.    Procedures: Large Joint Inj Date/Time: 04/08/2017 11:25 AM Performed by: Adonis HugueninZAMORA, Lolitha Tortora R Authorized by: Barnie DelZAMORA, Shamera Yarberry R   Consent Given by:  Patient Site marked: the procedure site was marked   Timeout: prior to procedure the correct patient, procedure, and site was verified   Indications:  Pain and diagnostic evaluation Location:  Knee Site:  R knee Needle Size:  22 G Needle Length:  1.5 inches Ultrasound Guidance: No   Fluoroscopic Guidance: No   Arthrogram: No   Medications:  5 mL lidocaine 1 %; 40 mg methylPREDNISolone acetate 40 MG/ML Aspiration Attempted: No   Patient tolerance:  Patient tolerated the procedure well with no immediate complications Large  Joint Inj Date/Time: 04/08/2017 11:25 AM Performed by: Adonis Huguenin Authorized by: Adonis Huguenin   Consent Given by:  Patient Site marked: the procedure site was marked   Timeout: prior to procedure the correct patient, procedure, and site was verified   Indications:  Pain and diagnostic evaluation Location:  Shoulder Site:  R subacromial bursa Prep: patient was prepped and draped in usual sterile fashion   Needle Size:  22 G Needle Length:  1.5 inches Ultrasound Guidance: No   Fluoroscopic Guidance: No   Arthrogram: No   Medications:  5 mL lidocaine 1 %; 40 mg  methylPREDNISolone acetate 40 MG/ML Aspiration Attempted: No   Patient tolerance:  Patient tolerated the procedure well with no immediate complications    Clinical Data: No additional findings.  ROS:  All other systems negative, except as noted in the HPI. Review of Systems  Constitutional: Negative for chills and fever.  Musculoskeletal: Positive for arthralgias.    Objective: Vital Signs: Ht 5\' 9"  (1.753 m)   Wt 240 lb (108.9 kg)   BMI 35.44 kg/m   Specialty Comments:  No specialty comments available.  PMFS History: Patient Active Problem List   Diagnosis Date Noted  . Acute blood loss anemia 07/31/2013  . 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 Asthma    allergy related    No family history on file.  Past Surgical History:  Procedure Laterality Date  . BACK SURGERY  1991   laminectomy  . CARDIAC CATHETERIZATION     5-7 years ago, states it was clear  . COLONOSCOPY    . HERNIA REPAIR     inguinal hernia repair as a baby on right groin   Social History   Occupational History  . Not on file.   Social History Main Topics  . Smoking status: Former Smoker    Quit date: 07/27/2003  . Smokeless tobacco: Current User    Types: Chew  . Alcohol use Yes     Comment: weekly  . Drug use: No  . Sexual activity: Not on file

## 2017-08-14 ENCOUNTER — Other Ambulatory Visit (INDEPENDENT_AMBULATORY_CARE_PROVIDER_SITE_OTHER): Payer: Self-pay | Admitting: Family

## 2017-08-14 ENCOUNTER — Telehealth (INDEPENDENT_AMBULATORY_CARE_PROVIDER_SITE_OTHER): Payer: Self-pay | Admitting: Physical Medicine and Rehabilitation

## 2017-08-14 DIAGNOSIS — G8929 Other chronic pain: Secondary | ICD-10-CM

## 2017-08-14 DIAGNOSIS — M25511 Pain in right shoulder: Principal | ICD-10-CM

## 2017-08-14 DIAGNOSIS — M7541 Impingement syndrome of right shoulder: Secondary | ICD-10-CM

## 2017-08-14 NOTE — Telephone Encounter (Signed)
See message below  Please advise

## 2017-08-14 NOTE — Telephone Encounter (Signed)
Send message to Ronald Parsons but if the injection she did helped he might want to See Lajoyce Cornersuda or Public house managerDean for evaluation. He has had prior cervical fusion but if subacromial injection helped then this need to be worked up.

## 2017-08-15 ENCOUNTER — Telehealth (INDEPENDENT_AMBULATORY_CARE_PROVIDER_SITE_OTHER): Payer: Self-pay | Admitting: Family

## 2017-08-15 NOTE — Telephone Encounter (Signed)
I called and left patient a voicemail. Advised next step would be MRI for further evaluation of shoulder pathology. If this is ok with him we can order and try to get approval from insurance. Advised him to call us back to let us know either way.

## 2017-08-15 NOTE — Telephone Encounter (Signed)
Patient ok with proceeding, Erin placed order for MRI scan of right shoulder. Patient aware.

## 2017-08-16 NOTE — Telephone Encounter (Signed)
Message forwarded.

## 2017-09-11 ENCOUNTER — Other Ambulatory Visit (INDEPENDENT_AMBULATORY_CARE_PROVIDER_SITE_OTHER): Payer: Self-pay | Admitting: Family

## 2017-09-16 ENCOUNTER — Ambulatory Visit
Admission: RE | Admit: 2017-09-16 | Discharge: 2017-09-16 | Disposition: A | Discharge status: Home / Self Care | Payer: BLUE CROSS/BLUE SHIELD | Source: Ambulatory Visit | Attending: Family | Admitting: Family

## 2017-09-16 DIAGNOSIS — G8929 Other chronic pain: Secondary | ICD-10-CM

## 2017-09-16 DIAGNOSIS — M25511 Pain in right shoulder: Principal | ICD-10-CM

## 2017-09-18 ENCOUNTER — Ambulatory Visit (INDEPENDENT_AMBULATORY_CARE_PROVIDER_SITE_OTHER): Payer: Medicare Other | Admitting: Orthopedic Surgery

## 2017-09-18 ENCOUNTER — Encounter (INDEPENDENT_AMBULATORY_CARE_PROVIDER_SITE_OTHER): Payer: Self-pay | Admitting: Orthopedic Surgery

## 2017-09-18 VITALS — Ht 69.0 in | Wt 240.0 lb

## 2017-09-18 DIAGNOSIS — M7541 Impingement syndrome of right shoulder: Secondary | ICD-10-CM | POA: Diagnosis not present

## 2017-09-18 NOTE — Progress Notes (Signed)
Office Visit Note   Patient: Ronald Parsons           Date of Birth: November 01, 1955           MRN: [id number] Visit Date: 09/18/2017              Requested by: Aida PufferLittle, James, MD [address] [place], [place] [id number] PCP: Aida PufferLittle, James, MD  Chief Complaint  Patient presents with  . Right Shoulder - Follow-up    MRI review       HPI: Patient is a 61 year old gentleman who presents in follow-up for his right shoulder.  Patient states he has pain with activities of daily living he does not notice much decrease in his range of motion he states that his arm is worse when it is hanging down feels better when it supported.  Assessment & Plan: Visit Diagnoses:  1. Impingement syndrome of right shoulder     Plan: Discussed he could still use Aleve 2 p.o. twice daily and could proceed with arthroscopic debridement decompression including debridement of the biceps tendon.  Patient states he cannot take time off from work at this time he will continue with Aleve and will call us when he wants to schedule surgery.  Risks and benefits were discussed outpatient surgery at the orthopedic surgical center was discussed.  Follow-Up Instructions: Return if symptoms worsen or fail to improve.   Ortho Exam  Patient is alert, oriented, no adenopathy, well-dressed, normal affect, normal respiratory effort. Patient patient has full range of motion shoulders.  Examination of the right shoulder he has pain with Neer and Hawkins impingement test pain with a drop arm test.  He is tender to palpation over the biceps tendon.  Review of the MRI scan shows a complete tear of the long head of the biceps tendon there is a supraspinatus and infraspinatus tendinopathy as well as AC osteoarthritis and bursitis.  Imaging: No results found. No images are attached to the encounter.  Labs: No results found for: HGBA1C, ESRSEDRATE, CRP, LABURIC, REPTSTATUS, GRAMSTAIN, CULT,  LABORGA  @LABSALLVALUES (HGBA1)@  @BMI1 @  Orders:  No orders of the defined types were placed in this encounter.  No orders of the defined types were placed in this encounter.    Procedures: No procedures performed  Clinical Data: No additional findings.  ROS:  All other systems negative, except as noted in the HPI. Review of Systems  Objective: Vital Signs: Ht 5\' 9"  (1.753 m)   Wt 240 lb (108.9 kg)   BMI 35.44 kg/m   Specialty Comments:  No specialty comments available.  PMFS History: Patient Active Problem List   Diagnosis Date Noted  . Acute blood loss anemia 07/31/2013  . 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 Asthma    allergy related    History reviewed. No pertinent family history.  Past Surgical History:  Procedure Laterality Date  . BACK SURGERY  1991   laminectomy  . CARDIAC CATHETERIZATION     5-7 years ago, states it was clear  . COLONOSCOPY    . HERNIA REPAIR     inguinal hernia repair as a baby on right groin   Social History   Occupational History  . Not on file  Tobacco Use  . Smoking status: Former Smoker    Last attempt to quit: 07/27/2003    Years since quitting: 14.1  . Smokeless tobacco: Current User    Types: Chew  Substance and Sexual Activity  . Alcohol use: Yes    Comment: weekly  . Drug use: No  . Sexual activity: Not on file

## 2018-10-03 ENCOUNTER — Institutional Professional Consult (permissible substitution): Payer: BLUE CROSS/BLUE SHIELD | Admitting: Neurology

## 2018-10-03 ENCOUNTER — Telehealth: Payer: Self-pay

## 2018-10-03 NOTE — Telephone Encounter (Signed)
Pt did not show for their appt with Dr. Athar today.  

## 2018-10-04 ENCOUNTER — Encounter: Payer: Self-pay | Admitting: Neurology

## 2018-11-22 ENCOUNTER — Institutional Professional Consult (permissible substitution): Payer: Medicare Other | Admitting: Neurology

## 2019-09-02 ENCOUNTER — Other Ambulatory Visit: Payer: Self-pay

## 2019-09-02 ENCOUNTER — Ambulatory Visit
Admission: RE | Admit: 2019-09-02 | Discharge: 2019-09-02 | Disposition: A | Discharge status: Home / Self Care | Payer: Medicare Other | Source: Ambulatory Visit | Attending: Family Medicine | Admitting: Family Medicine

## 2019-09-02 ENCOUNTER — Other Ambulatory Visit: Payer: Self-pay | Admitting: Family Medicine

## 2019-09-02 DIAGNOSIS — M545 Low back pain, unspecified: Secondary | ICD-10-CM

## 2019-09-02 DIAGNOSIS — M25551 Pain in right hip: Secondary | ICD-10-CM

## 2019-09-24 ENCOUNTER — Ambulatory Visit: Payer: Medicare Other | Admitting: Physical Medicine and Rehabilitation

## 2020-04-04 IMAGING — CR DG LUMBAR SPINE COMPLETE 4+V
4 series · 4 of 4 positions shown · non-contrast
Comparison: 07/17/2014

CLINICAL DATA: Fell 1 month ago. Low back pain. Bilateral hip pain.
Surgery 4358.

EXAM:
LUMBAR SPINE - COMPLETE 4+ VIEW

[t lumbar spine ap]
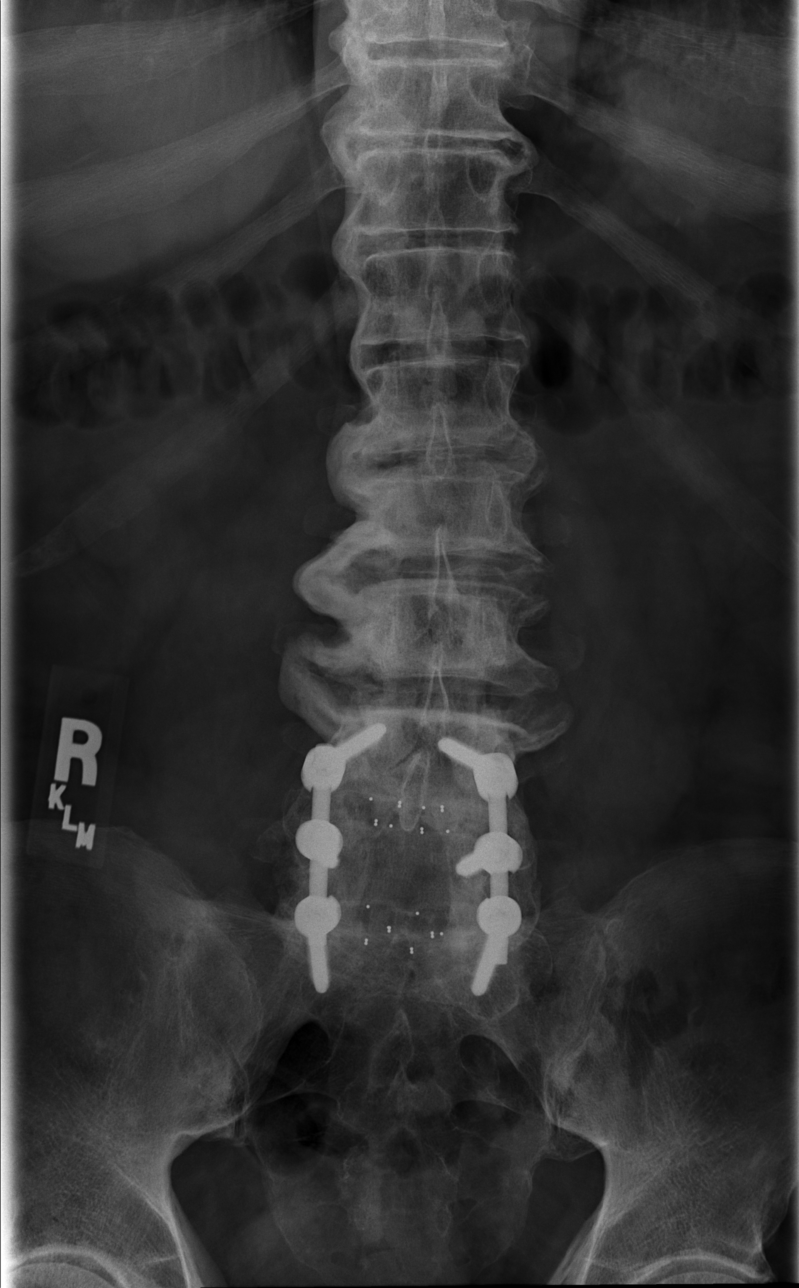

[t lumbar spine obl (1 of 2)]
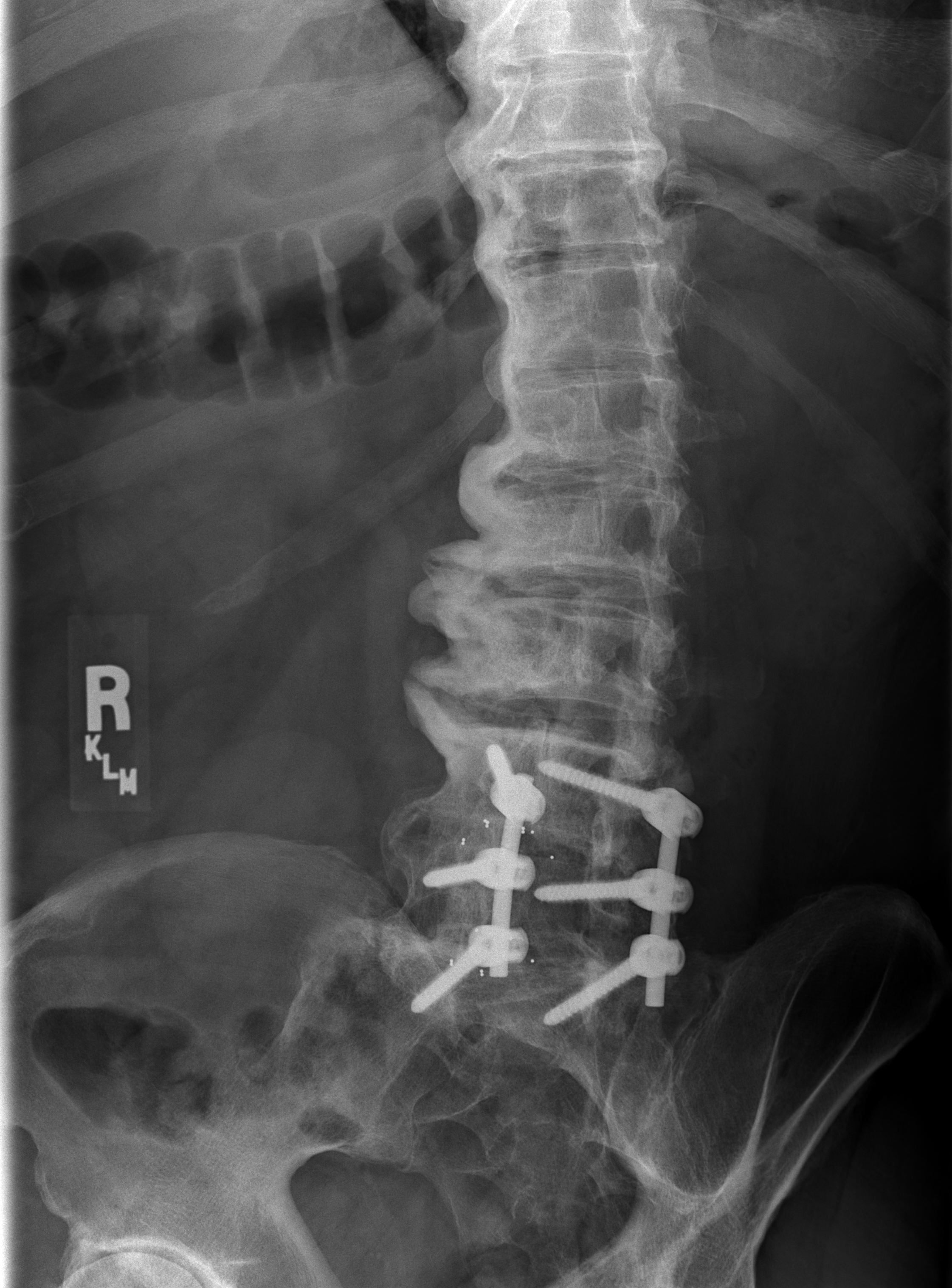

[t lumbar spine obl (2 of 2)]
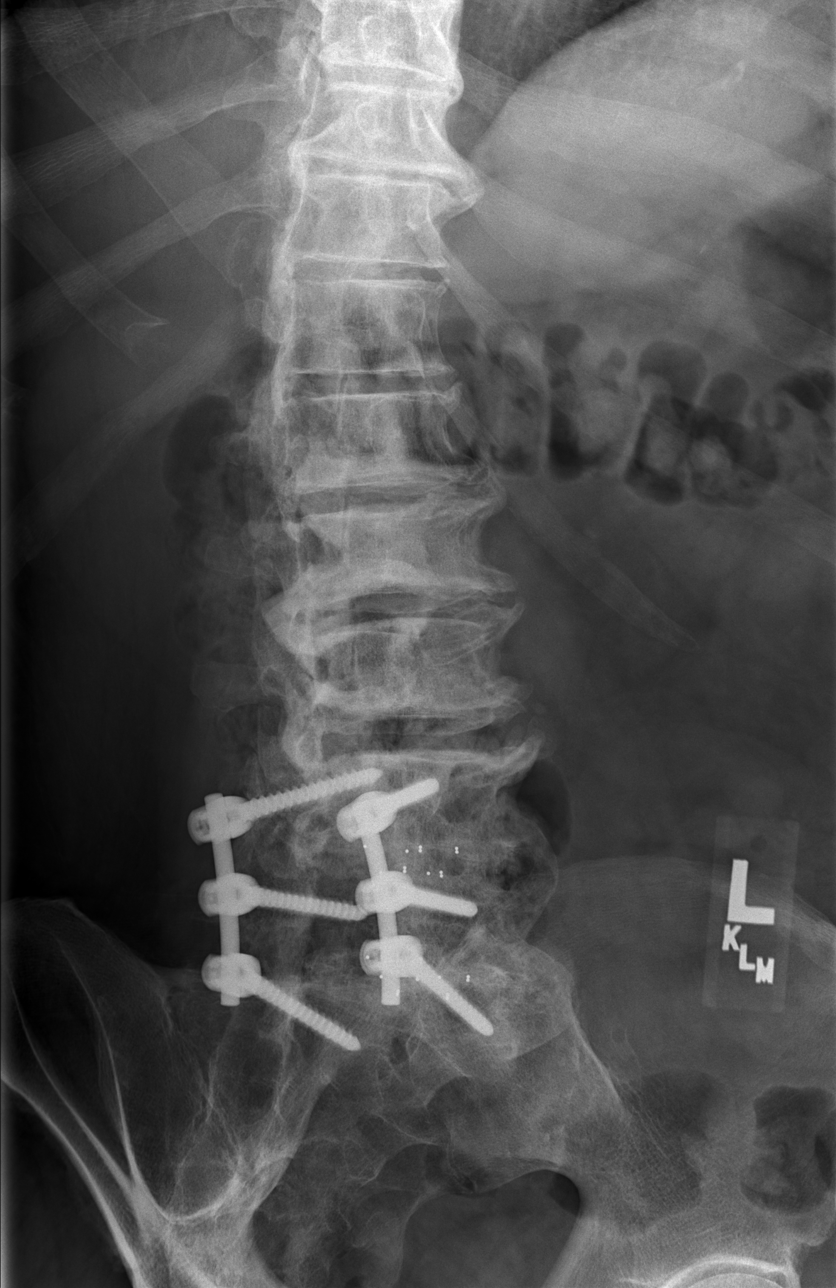

[t lumbar l-5 s-1 spot]
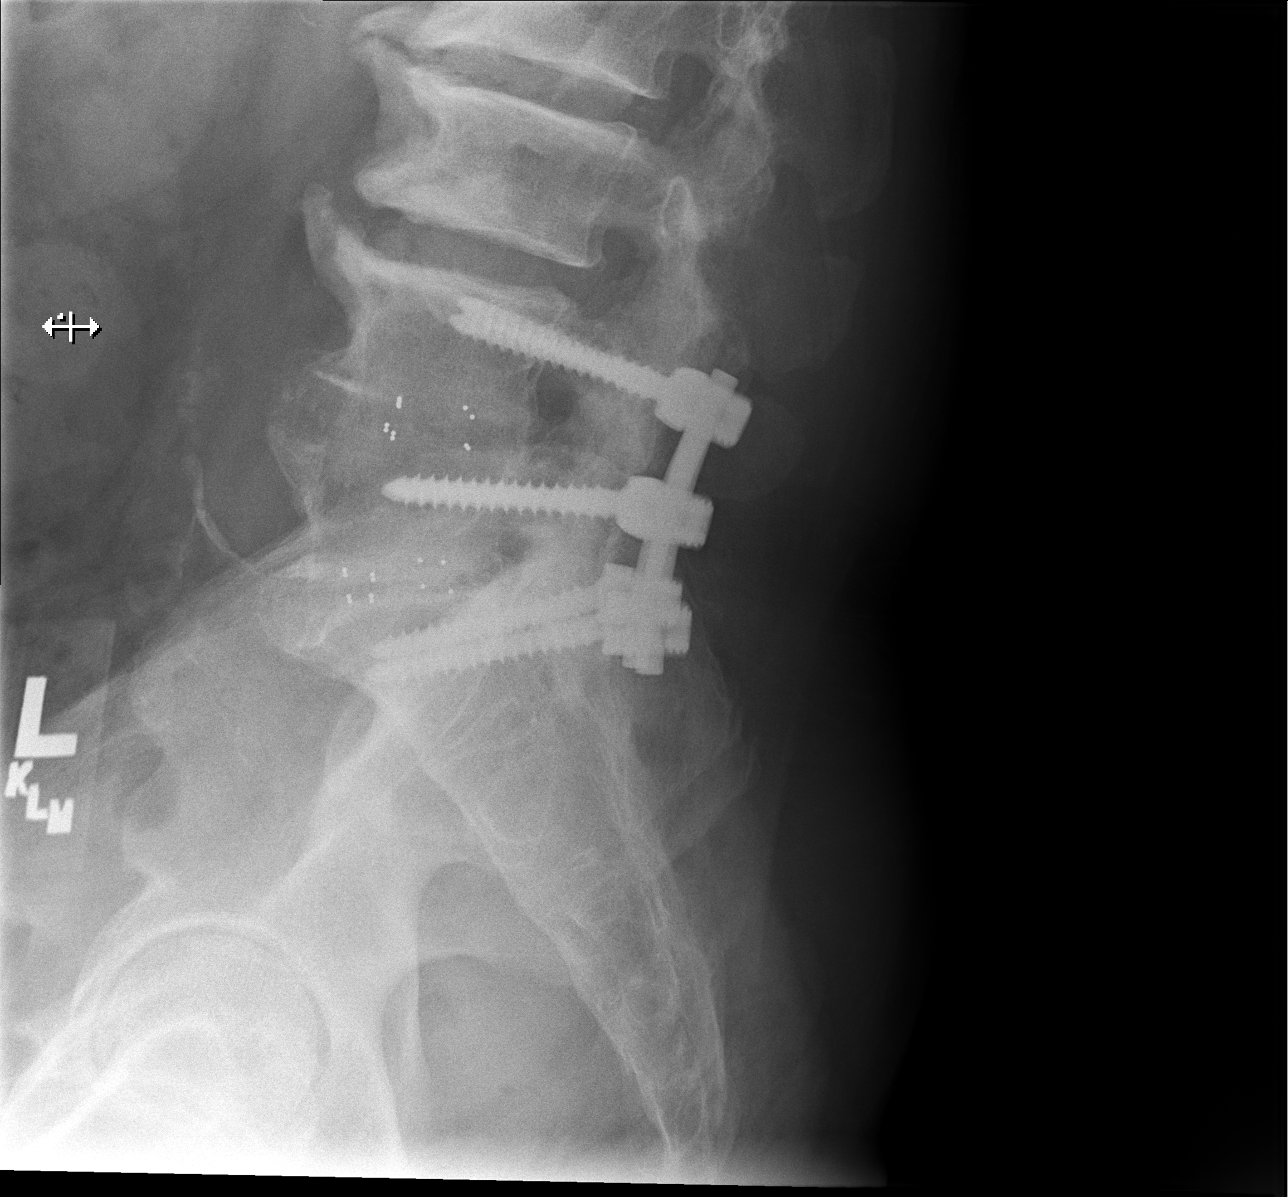

[4 of 4 positions shown; findings below may reference images not displayed]

FINDINGS: No chronic or traumatic malalignment. Prominent bridging osteophytes
from the lower thoracic region, through the lumbar region to the
fusion segment. Previous posterior decompression, diskectomy and
fusion procedure from L4 to the sacrum. Fusion appears solid. No
evidence of hardware loosening or fracture. Chronic sacroiliac
fusion at least in the fibrous portions. No finding that could be
attributed to the recent fall.
IMPRESSION: No acute or traumatic finding. Previous posterior decompression,
diskectomy and fusion from L4 to the sacrum. Chronic sacroiliac
fusion at least in the fibrous portions. Chronic bridging
osteophytes from the lower thoracic region to the fusion segment.

## 2020-04-22 ENCOUNTER — Encounter: Payer: Self-pay | Admitting: Cardiology

## 2020-04-22 ENCOUNTER — Ambulatory Visit: Payer: Medicare Other | Admitting: Cardiology

## 2020-04-22 ENCOUNTER — Other Ambulatory Visit: Payer: Self-pay

## 2020-04-22 VITALS — BP 131/84 | HR 65 | Ht 69.0 in | Wt 219.0 lb

## 2020-04-22 DIAGNOSIS — R0609 Other forms of dyspnea: Secondary | ICD-10-CM

## 2020-04-22 DIAGNOSIS — Z87891 Personal history of nicotine dependence: Secondary | ICD-10-CM

## 2020-04-22 DIAGNOSIS — I10 Essential (primary) hypertension: Secondary | ICD-10-CM

## 2020-04-22 DIAGNOSIS — R072 Precordial pain: Secondary | ICD-10-CM

## 2020-04-22 DIAGNOSIS — E6609 Other obesity due to excess calories: Secondary | ICD-10-CM

## 2020-04-22 NOTE — Progress Notes (Signed)
Date:  04/22/2020   ID:  Ronald Parsons, DOB 04-08-1956, MRN [id number]  PCP:  Aida Puffer, MD  Cardiologist:  Tessa Lerner, DO, [facility] (established care 04/22/2020)  REASON FOR CONSULT: Shortness of breath  REQUESTING PHYSICIAN:  Aida Puffer, MD [address] [place],  [place] [address]  Chief Complaint  Patient presents with  . Shortness of Breath    pt c/o SOB for 1 month   . New Patient (Initial Visit)  . Chest Pain    HPI  Ronald Parsons is a 64 y.o. male who presents to the office with a chief complaint of "shortness of breath." He is referred to the office at the request of Little, Fayrene Fearing, MD. Patient's past medical history and cardiovascular risk factors include: Benign essential hypertension, former smoker, obesity due to excess calories, family history of coronary artery disease.  Shortness of breath: Patient states that she has been having shortness of breath for the last couple months and the intensity has relatively remained stable.  He denies any orthopnea, paroxysmal nocturnal dyspnea or lower extremity swelling.  No improving factors, at times his symptoms does get worse with effort related activities.  Chest tightness: Patient states that he has experienced intermittent chest tightness at times.  It is located substernally, intensity is 1 out of 10, last for about 2-3 minutes in duration, it does improve with rest, and does not get worse with effort related activities.  Patient states that he has family history of coronary artery disease.  Patient states that he was with [facility] cardiology approximately 13 years ago during which time he had a left heart catheterization and was noted to have normal coronary arteries.  I do not have the report to review and confirm these findings.  Denies prior history of coronary artery disease, myocardial infarction, congestive heart failure, deep venous thrombosis, pulmonary embolism, stroke, transient ischemic attack.  FUNCTIONAL  STATUS: Does landscaping regularly (mowing lawn, raking leaves, etc) but no structured exercise program or daily routine.   ALLERGIES: Allergies  Allergen Reactions  . Hydromorphone Hcl   . Penicillins Hives    As a child  . Lovastatin Other (See Comments)    Myalgia    MEDICATION LIST PRIOR TO VISIT: Current Meds  Medication Sig  . bisoprolol-hydrochlorothiazide (ZIAC) 2.5-6.25 MG tablet Take 0.5 tablets by mouth daily.   . celecoxib (CELEBREX) 200 MG capsule Take 200 mg by mouth as needed.   . cetirizine-pseudoephedrine (ZYRTEC-D) 5-120 MG per tablet Take 1 tablet by mouth 2 (two) times daily as needed for allergies.  . cyclobenzaprine (FLEXERIL) 10 MG tablet Take 10 mg by mouth 3 (three) times daily as needed for muscle spasms.  Marland Kitchen loratadine-pseudoephedrine (CLARITIN-D 12-HOUR) 5-120 MG tablet Take 1 tablet by mouth 2 (two) times daily.  . NON FORMULARY balance of nature  . oxybutynin (DITROPAN XL) 15 MG 24 hr tablet Take 15 mg by mouth at bedtime.  Marland Kitchen testosterone cypionate (DEPOTESTOTERONE CYPIONATE) 200 MG/ML injection Inject into the muscle every 21 ( twenty-one) days.  Marland Kitchen zolpidem (AMBIEN) 5 MG tablet Take 10 mg by mouth at bedtime as needed for sleep.   . [DISCONTINUED] aspirin 325 MG tablet Take 650 mg by mouth daily as needed for pain.      PAST MEDICAL HISTORY: Past Medical History:  Diagnosis Date  . Asthma    allergy related  . Cervical radiculitis   . Gout   . Hyperlipidemia   . Hypertension   . Lumbago   .  Osteoarthritis     PAST SURGICAL HISTORY: Past Surgical History:  Procedure Laterality Date  . BACK SURGERY  1991   laminectomy  . CARDIAC CATHETERIZATION     5-7 years ago, states it was clear  . COLONOSCOPY    . HERNIA REPAIR     inguinal hernia repair as a baby on right groin    FAMILY HISTORY: The patient family history includes Breast cancer in his sister; Emphysema in his paternal grandfather; Heart disease in his father; Hypertension in his  father; Valvular heart disease in his maternal grandfather.  SOCIAL HISTORY:  The patient  reports that he quit smoking about 16 years ago. His smokeless tobacco use includes chew. He reports current alcohol use. He reports that he does not use drugs.  REVIEW OF SYSTEMS: Review of Systems  Constitutional: Negative for chills and fever.  HENT: Negative for hoarse voice and nosebleeds.   Eyes: Negative for discharge, double vision and pain.  Cardiovascular: Positive for chest pain and dyspnea on exertion. Negative for claudication, leg swelling, near-syncope, orthopnea, palpitations, paroxysmal nocturnal dyspnea and syncope.  Respiratory: Negative for hemoptysis and shortness of breath.   Musculoskeletal: Negative for muscle cramps and myalgias.  Gastrointestinal: Negative for abdominal pain, constipation, diarrhea, hematemesis, hematochezia, melena, nausea and vomiting.  Neurological: Negative for dizziness and light-headedness.    PHYSICAL EXAM: Vitals with BMI 04/22/2020 09/18/2017 04/07/2017  Height 5\' 9"  5\' 9"  5\' 9"   Weight 219 lbs 240 lbs 240 lbs  BMI 32.33 35.43 35.5  Systolic 131 - -  Diastolic 84 - -  Pulse 65 - -    CONSTITUTIONAL: Well-developed and well-nourished. No acute distress.  SKIN: Skin is warm and dry. No rash noted. No cyanosis. No pallor. No jaundice HEAD: Normocephalic and atraumatic.  EYES: No scleral icterus MOUTH/THROAT: Moist oral membranes.  NECK: No JVD present. No thyromegaly noted. No carotid bruits  LYMPHATIC: No visible cervical adenopathy.  CHEST Normal respiratory effort. No intercostal retractions  LUNGS: Clear to auscultation bilaterally. No stridor. No wheezes. No rales.  CARDIOVASCULAR: Regular rate and rhythm, positive S1-S2, soft systolic ejection murmur R2ICS 3/6, no rubs or gallops appreciated. ABDOMINAL: No apparent ascites.  EXTREMITIES: No peripheral edema, +2 DPT and PT, and varicose vein.  HEMATOLOGIC: No significant  bruising NEUROLOGIC: Oriented to person, place, and time. Nonfocal. Normal muscle tone.  PSYCHIATRIC: Normal mood and affect. Normal behavior. Cooperative  CARDIAC DATABASE: EKG: 04/22/2020: Normal sinus rhythm, 68 bpm, poor R wave progression, without underlying ischemia or injury pattern.  Echocardiogram: None in the past 5 years.   Stress Testing: None in the past 5 years.   Heart Catheterization: None in the past 5 years.   LABORATORY DATA: CBC Latest Ref Rng & Units 07/30/2013 07/26/2013  WBC 4.0 - 10.5 K/uL 13.1(H) 6.2  Hemoglobin 13.0 - 17.0 g/dL 11.914.3 17.1(H)  Hematocrit 39 - 52 % 42.8 48.4  Platelets 150 - 400 K/uL 146(L) 157    CMP Latest Ref Rng & Units 07/30/2013 07/26/2013  Glucose 70 - 99 mg/dL 147(W189(H) 295(A103(H)  BUN 6 - 23 mg/dL 20 12  Creatinine 2.130.50 - 1.35 mg/dL 0.861.30 5.780.91  Sodium 469135 - 145 mEq/L 132(L) 139  Potassium 3.5 - 5.1 mEq/L 4.3 4.7  Chloride 96 - 112 mEq/L 97 101  CO2 19 - 32 mEq/L 20 29  Calcium 8.4 - 10.5 mg/dL 8.0(L) 8.8    Lipid Panel  No results found for: CHOL, TRIG, HDL, CHOLHDL, VLDL, LDLCALC, LDLDIRECT, LABVLDL  No components found  for: NTPROBNP No results for input(s): PROBNP in the last 8760 hours. No results for input(s): TSH in the last 8760 hours.  BMP No results for input(s): NA, K, CL, CO2, GLUCOSE, BUN, CREATININE, CALCIUM, GFRNONAA, GFRAA in the last 8760 hours.  HEMOGLOBIN A1C No results found for: HGBA1C, MPG  IMPRESSION:    ICD-10-CM   1. Dyspnea on exertion  R06.00 EKG 12-Lead    PCV ECHOCARDIOGRAM COMPLETE    PCV MYOCARDIAL PERFUSION WITH LEXISCAN  2. Precordial chest pain  R07.2 PCV ECHOCARDIOGRAM COMPLETE    PCV MYOCARDIAL PERFUSION WITH LEXISCAN  3. Benign hypertension  I10   4. Former smoker  Z87.891   5. Class 1 obesity due to excess calories without serious comorbidity with body mass index (BMI) of 32.0 to 32.9 in adult  E66.09    Z68.32      RECOMMENDATIONS: ZYRION COEY is a 64 y.o. male whose past  medical history and cardiac risk factors include: Benign essential hypertension, former smoker, obesity due to excess calories, family history of coronary artery disease.  Dyspnea on exertion:  EKG shows normal sinus rhythm without underlying ischemia or injury pattern.  Patient is asked related dyspnea may be an anginal equivalent and therefore given his multiple cardiovascular risk factors recommend an ischemic evaluation.  Echocardiogram will be ordered to evaluate for structural heart disease and left ventricular systolic function.  Nuclear stress test recommended to evaluate for reversible ischemia.  Precordial chest pain with typical and atypical features: See above  Essential hypertension: Well-controlled.  Currently managed by primary team.  Screening for hypercholesterolemia is being managed by primary team as per patient on a yearly basis at the physical.  Obesity, due to excess calories: . Body mass index is 32.34 kg/m. . I reviewed with the patient the importance of diet, regular physical activity/exercise, weight loss.   . Patient is educated on increasing physical activity gradually as tolerated.  With the goal of moderate intensity exercise for 30 minutes a day 5 days a week.  FINAL MEDICATION LIST END OF ENCOUNTER: No orders of the defined types were placed in this encounter.   Medications Discontinued During This Encounter  Medication Reason  . amitriptyline (ELAVIL) 10 MG tablet Completed Course  . aspirin 325 MG tablet Patient Preference  . Multiple Vitamins-Minerals (MULTIVITAMIN WITH MINERALS) tablet Patient Preference     Current Outpatient Medications:  .  bisoprolol-hydrochlorothiazide (ZIAC) 2.5-6.25 MG tablet, Take 0.5 tablets by mouth daily. , Disp: , Rfl:  .  celecoxib (CELEBREX) 200 MG capsule, Take 200 mg by mouth as needed. , Disp: , Rfl:  .  cetirizine-pseudoephedrine (ZYRTEC-D) 5-120 MG per tablet, Take 1 tablet by mouth 2 (two) times daily as  needed for allergies., Disp: , Rfl:  .  cyclobenzaprine (FLEXERIL) 10 MG tablet, Take 10 mg by mouth 3 (three) times daily as needed for muscle spasms., Disp: , Rfl:  .  loratadine-pseudoephedrine (CLARITIN-D 12-HOUR) 5-120 MG tablet, Take 1 tablet by mouth 2 (two) times daily., Disp: , Rfl:  .  NON FORMULARY, balance of nature, Disp: , Rfl:  .  oxybutynin (DITROPAN XL) 15 MG 24 hr tablet, Take 15 mg by mouth at bedtime., Disp: , Rfl:  .  testosterone cypionate (DEPOTESTOTERONE CYPIONATE) 200 MG/ML injection, Inject into the muscle every 21 ( twenty-one) days., Disp: , Rfl:  .  zolpidem (AMBIEN) 5 MG tablet, Take 10 mg by mouth at bedtime as needed for sleep. , Disp: , Rfl:  .  allopurinol (ZYLOPRIM)  300 MG tablet, Take 300 mg by mouth daily. (Patient not taking: Reported on 04/22/2020), Disp: , Rfl:  .  colchicine 0.6 MG tablet, Take 0.6 mg by mouth daily as needed. (Patient not taking: Reported on 04/22/2020), Disp: , Rfl:   Orders Placed This Encounter  Procedures  . PCV MYOCARDIAL PERFUSION WITH LEXISCAN  . EKG 12-Lead  . PCV ECHOCARDIOGRAM COMPLETE    There are no Patient Instructions on file for this visit.   --Continue cardiac medications as reconciled in final medication list. --Return in about 6 weeks (around 06/03/2020) for re-evaluation of symptoms, review echo and stress test. . Or sooner if needed. --Continue follow-up with your primary care physician regarding the management of your other chronic comorbid conditions.  Patient's questions and concerns were addressed to his satisfaction. He voices understanding of the instructions provided during this encounter.   This note was created using a voice recognition software as a result there may be grammatical errors inadvertently enclosed that do not reflect the nature of this encounter. Every attempt is made to correct such errors.  Tessa Lerner, [place], [facility]  Pager: [phone] Office: [phone]

## 2020-04-24 ENCOUNTER — Other Ambulatory Visit: Payer: Self-pay

## 2020-04-24 ENCOUNTER — Ambulatory Visit: Payer: Medicare Other

## 2020-04-24 DIAGNOSIS — R072 Precordial pain: Secondary | ICD-10-CM

## 2020-04-24 DIAGNOSIS — R0609 Other forms of dyspnea: Secondary | ICD-10-CM

## 2020-04-30 ENCOUNTER — Telehealth: Payer: Self-pay

## 2020-04-30 NOTE — Telephone Encounter (Signed)
-----   Message from [place], [place] sent at 04/27/2020 11:28 AM EDT ----- Please inform the patient that his LV function (pumping activity of the heart) is within normal limits.  Echo findings do show regional wall motion abnormalities which may be concerning for possible coronary artery disease.  Very important that he followed up with his stress test that is scheduled on 05/04/2020.  I would like to see him soon after his stress test and not wait until August 2021.  Please move up his appointment several days after the stress test.  In the interim if any questions or concerns arise or worsening of symptoms please have him call the office or go to the nearest ER via EMS.

## 2020-04-30 NOTE — Telephone Encounter (Signed)
LMTCB

## 2020-05-01 NOTE — Telephone Encounter (Signed)
Spoke with patient concerning results and rescheduling August appointment to sooner date. Patient verbalized understanding.

## 2020-05-04 ENCOUNTER — Ambulatory Visit: Payer: Medicare Other

## 2020-05-04 ENCOUNTER — Other Ambulatory Visit: Payer: Self-pay

## 2020-05-04 DIAGNOSIS — R0609 Other forms of dyspnea: Secondary | ICD-10-CM

## 2020-05-04 DIAGNOSIS — R072 Precordial pain: Secondary | ICD-10-CM

## 2020-05-12 ENCOUNTER — Other Ambulatory Visit: Payer: Self-pay

## 2020-05-12 ENCOUNTER — Encounter: Payer: Self-pay | Admitting: Cardiology

## 2020-05-12 ENCOUNTER — Ambulatory Visit: Payer: Medicare Other | Admitting: Cardiology

## 2020-05-12 VITALS — BP 135/90 | HR 86 | Ht 69.0 in | Wt 220.0 lb

## 2020-05-12 DIAGNOSIS — I10 Essential (primary) hypertension: Secondary | ICD-10-CM

## 2020-05-12 DIAGNOSIS — I208 Other forms of angina pectoris: Secondary | ICD-10-CM

## 2020-05-12 DIAGNOSIS — R0609 Other forms of dyspnea: Secondary | ICD-10-CM

## 2020-05-12 DIAGNOSIS — Z87891 Personal history of nicotine dependence: Secondary | ICD-10-CM

## 2020-05-12 DIAGNOSIS — Z6832 Body mass index (BMI) 32.0-32.9, adult: Secondary | ICD-10-CM

## 2020-05-12 MED ORDER — LOSARTAN POTASSIUM 25 MG PO TABS: 25.0000 mg | ORAL_TABLET | Freq: Every evening | ORAL | 3 refills | Status: DC

## 2020-05-12 MED ORDER — NITROGLYCERIN 0.4 MG SL SUBL: 0.4000 mg | SUBLINGUAL_TABLET | SUBLINGUAL | 0 refills | Status: DC | PRN

## 2020-05-12 MED ORDER — ASPIRIN EC 81 MG PO TBEC: 81.0000 mg | DELAYED_RELEASE_TABLET | Freq: Every day | ORAL | 3 refills | Status: DC

## 2020-05-12 MED ORDER — METOPROLOL SUCCINATE ER 25 MG PO TB24: 25.0000 mg | ORAL_TABLET | Freq: Every morning | ORAL | 2 refills | Status: DC

## 2020-05-12 NOTE — Progress Notes (Signed)
Date:  05/12/2020   ID:  Jae Dire, DOB [birth year], MRN [id number]  PCP:  Aida Puffer, MD  Cardiologist:  Tessa Lerner, DO, [facility] (established care 04/22/2020)  REASON FOR CONSULT: Shortness of breath  Date: 05/12/20 Last Office Visit: 04/22/2020  Chief Complaint  Patient presents with  . Shortness of Breath  . Chest Pain    Chest tightness  . Results    HPI  Ronald Parsons is a 64 y.o. male who presents to the office with a chief complaint of "shortness of breath, chest tightness, review test results." Patient's past medical history and cardiovascular risk factors include: Benign essential hypertension, former smoker, obesity due to excess calories, family history of coronary artery disease.  Patient was originally referred to the office at the request of his primary care provider back in June for evaluation of shortness of breath and chest tightness.  Patient states that his chest tightness usually occurs with effort related activities, located substernally, intensity 3 out of 10, does improve with rest.  He also has effort related dyspnea that has been more progressive over the last couple months.  He denies heart failure symptoms and clinically appears euvolemic.  Since last office visit he was recommended to undergo an echocardiogram and nuclear stress test for further risk stratification.  Patient states that he underwent left heart catheterization approximately 13 years ago with [facility] and was noted to have normal coronaries at that time.  Since last office visit patient states that his symptoms remain relatively stable without any significant improvement.  Patient did have an echocardiogram done which noted preserved left ventricular systolic function with grade 1 diastolic impairment and regional wall motion abnormality suggestive of hypokinesis involving the mid to distal inferoseptal segments.  Nuclear stress test was reported to be low risk but did note decreased tracer  uptake in the basal inferior inferoseptal segments worse on rest images compared to stress.  However overall sensitivity/specificity is reduced due to diaphragmatic attenuation artifact.  Of note, on the echocardiogram patient was noted to have aortic stenosis as well with a peak velocity of 2.5 m/s, mean gradient of 14 mmHg, and a dimensional index of 0.3.  Findings are more consistent with at least mildly AS.   Denies prior history of coronary artery disease, myocardial infarction, congestive heart failure, deep venous thrombosis, pulmonary embolism, stroke, transient ischemic attack.  FUNCTIONAL STATUS: Does landscaping regularly (mowing lawn, raking leaves, etc) but no structured exercise program or daily routine.   ALLERGIES: Allergies  Allergen Reactions  . Hydromorphone Hcl   . Penicillins Hives    As a child  . Lovastatin Other (See Comments)    Myalgia    MEDICATION LIST PRIOR TO VISIT: Current Meds  Medication Sig  . celecoxib (CELEBREX) 200 MG capsule Take 200 mg by mouth as needed.   . cetirizine-pseudoephedrine (ZYRTEC-D) 5-120 MG per tablet Take 1 tablet by mouth 2 (two) times daily as needed for allergies.  . cyclobenzaprine (FLEXERIL) 10 MG tablet Take 10 mg by mouth 3 (three) times daily as needed for muscle spasms.  Marland Kitchen loratadine-pseudoephedrine (CLARITIN-D 12-HOUR) 5-120 MG tablet Take 1 tablet by mouth 2 (two) times daily.  . NON FORMULARY balance of nature  . testosterone cypionate (DEPOTESTOTERONE CYPIONATE) 200 MG/ML injection Inject into the muscle every 21 ( twenty-one) days.  Marland Kitchen zolpidem (AMBIEN) 5 MG tablet Take 10 mg by mouth at bedtime as needed for sleep.   . [DISCONTINUED] bisoprolol-hydrochlorothiazide (ZIAC) 2.5-6.25 MG tablet Take 0.5 tablets  by mouth daily.      PAST MEDICAL HISTORY: Past Medical History:  Diagnosis Date  . Asthma    allergy related  . Cervical radiculitis   . Gout   . Hyperlipidemia   . Hypertension   . Lumbago   .  Osteoarthritis     PAST SURGICAL HISTORY: Past Surgical History:  Procedure Laterality Date  . BACK SURGERY  1991   laminectomy  . CARDIAC CATHETERIZATION     5-7 years ago, states it was clear  . COLONOSCOPY    . HERNIA REPAIR     inguinal hernia repair as a baby on right groin    FAMILY HISTORY: The patient family history includes Breast cancer in his sister; Emphysema in his paternal grandfather; Heart disease in his father; Hypertension in his father; Valvular heart disease in his maternal grandfather.  SOCIAL HISTORY:  The patient  reports that he quit smoking about 16 years ago. His smokeless tobacco use includes chew. He reports current alcohol use. He reports that he does not use drugs.  REVIEW OF SYSTEMS: Review of Systems  Constitutional: Negative for chills and fever.  HENT: Negative for hoarse voice and nosebleeds.   Eyes: Negative for discharge, double vision and pain.  Cardiovascular: Positive for chest pain and dyspnea on exertion. Negative for claudication, leg swelling, near-syncope, orthopnea, palpitations, paroxysmal nocturnal dyspnea and syncope.  Respiratory: Negative for hemoptysis and shortness of breath.   Musculoskeletal: Negative for muscle cramps and myalgias.  Gastrointestinal: Negative for abdominal pain, constipation, diarrhea, hematemesis, hematochezia, melena, nausea and vomiting.  Neurological: Negative for dizziness and light-headedness.    PHYSICAL EXAM: Vitals with BMI 05/12/2020 04/22/2020 09/18/2017  Height 5\' 9"  5\' 9"  5\' 9"   Weight 220 lbs 219 lbs 240 lbs  BMI 32.47 32.33 35.43  Systolic 135 131 -  Diastolic 90 84 -  Pulse 86 65 -    CONSTITUTIONAL: Well-developed and well-nourished. No acute distress.  SKIN: Skin is warm and dry. No rash noted. No cyanosis. No pallor. No jaundice HEAD: Normocephalic and atraumatic.  EYES: No scleral icterus MOUTH/THROAT: Moist oral membranes.  NECK: No JVD present. No thyromegaly noted. No carotid  bruits  LYMPHATIC: No visible cervical adenopathy.  CHEST Normal respiratory effort. No intercostal retractions  LUNGS: Clear to auscultation bilaterally. No stridor. No wheezes. No rales.  CARDIOVASCULAR: Regular rate and rhythm, positive S1-S2, soft systolic ejection murmur R2ICS 3/6, no rubs or gallops appreciated. ABDOMINAL: No apparent ascites.  EXTREMITIES: No peripheral edema, +2 DPT and PT, and varicose vein.  HEMATOLOGIC: No significant bruising NEUROLOGIC: Oriented to person, place, and time. Nonfocal. Normal muscle tone.  PSYCHIATRIC: Normal mood and affect. Normal behavior. Cooperative  CARDIAC DATABASE: EKG: 04/22/2020: Normal sinus rhythm, 68 bpm, poor R wave progression, without underlying ischemia or injury pattern.  Echocardiogram: 04/24/2020:  Normal LV systolic function with visual EF 55-60%. Left ventricle cavity is normal in size. Moderate left ventricular hypertrophy. Regional wall motion abnormalities with hypokinesis of mid to distal inferoseptal segments. Doppler evidence of grade I (impaired) diastolic dysfunction, normal LAP.  Mild aortic valve stenosis (peak velocity 2.37m/s, MG 04/24/2020, DI 0.3).  Mild (Grade I) mitral regurgitation.  Mild tricuspid regurgitation.  No prior study for comparison.   Stress Testing: Lexiscan/modified Bruce Tetrofosmin stress test 05/04/2020: Decreased tracer uptake in basal inferior/ inferoseptal myocardium, more prominent on rest images. This likely represents diaphragmatic attenuation, although ischemia in this region cannot be excluded. Stress LVEF 57%. Low risk study.   Heart Catheterization: 13 years  ago, no records available.   LABORATORY DATA: We will obtain records from his PCPs office.  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 Dyspnea on exertion  R06.00 CBC with Differential/Platelet    Basic metabolic panel  3. Benign hypertension  I10 losartan (COZAAR) 25 MG tablet  4. Former smoker  Z87.891   5. Class 1 obesity due to excess calories without serious comorbidity with body mass index (BMI) of 32.0 to 32.9 in adult  E66.09    Z68.32      RECOMMENDATIONS: Ronald Parsons is a 63 y.o. male whose past medical history and cardiac risk factors include: Benign essential hypertension, former smoker, obesity due to excess calories, family history of coronary artery disease.  Anginal equivalent:  Patient symptoms of chest tightness and effort related dyspnea are suggestive to be his anginal equivalent.  Echocardiogram noted a preserved left ventricular systolic function with regional wall motion abnormalities.    Nuclear stress test findings reviewed with the patient.  The overall sensitivity/specificity is hindered due to diaphragmatic attenuation.  Start aspirin 81 mg p.o. daily until the current work-up is complete.  Sublingual nitroglycerin tablets to use on as needed basis.  Medication profile discussed with the patient.  Patient states that he does not have access to phosphodiesterase 5 inhibitor medications such as: Viagra/sildenafil, Cialis/tadalafil, Levitra/sildenafil.  Discontinue Bystolic/hydrochlorothiazide  Start Toprol-XL 25 mg p.o. every morning.  Start losartan 25 mg p.o. every afternoon.  Discussed undergoing coronary CTA versus heart catheterization.  The shared decision was to proceed with left and right heart catheterization given his underlying chest tightness and effort related dyspnea.   The left heart catheterization procedure was explained to the patient in detail. The indication, alternatives, risks and benefits were reviewed. Complications including but not limited to bleeding, infection, acute kidney injury, blood transfusion, heart rhythm disturbances, contrast (dye)  reaction, damage to the arteries or nerves in the legs or hands, cerebrovascular accident, myocardial infarction, need for emergent bypass surgery, blood clots in the legs, possible need for emergent blood transfusion, and rarely death were reviewed and discussed with the patient. The patient voices understanding and wishes to proceed.   The right heart catheterization procedure was discussed in great detail with the patient.  Indications, alternatives, risks and benefits were reviewed.  Complications including but not limited to new bundle branch block, needing pacing, perforation, pulmonary hemorrhage.  Aortic stenosis: Continue to monitor.  Patient educated on the 3 cardinal signs of aortic stenosis.  May consider using Langston's catheter to evaluate the hemodynamics of the aortic valve as there appears to be a little bit of discrepancy between the hemodynamics from 2D echo and the dimensional index.  Requested labs from his PCPs office.  Essential hypertension: Well-controlled.  Currently managed by primary team.  Screening for hypercholesterolemia is being managed by primary team as per patient on a yearly basis at the physical.  Obesity, due to excess calories: Body mass index is 32.49 kg/m. . I reviewed with the patient the importance of diet, regular physical activity/exercise, weight loss.   . Patient is educated on increasing physical activity gradually as tolerated.  With the goal of moderate intensity exercise for 30 minutes a day 5 days a week.    FINAL MEDICATION LIST END OF ENCOUNTER: Meds ordered this encounter  Medications  . metoprolol succinate (TOPROL XL) 25 MG 24 hr tablet    Sig: Take 1 tablet (25 mg total) by mouth in the morning. Hold if systolic blood pressure (top blood pressure number) less than 100 mmHg or heart rate less than 60 bpm (pulse).    Dispense:  30 tablet    Refill:  2  . nitroGLYCERIN (NITROSTAT) 0.4 MG SL tablet    Sig: Place 1 tablet (0.4 mg total)  under the tongue every 5 (five) minutes as needed for chest pain. If you require more than two tablets five minutes apart go to the nearest ER via EMS.    Dispense:  30 tablet    Refill:  0  . aspirin EC 81 MG tablet    Sig: Take 1 tablet (81 mg total) by mouth daily. Swallow whole.    Dispense:  90 tablet    Refill:  3  . losartan (COZAAR) 25 MG tablet    Sig: Take 1 tablet (25 mg total) by mouth every evening.    Dispense:  90 tablet    Refill:  3    Medications Discontinued During This Encounter  Medication Reason  . colchicine 0.6 MG tablet Completed Course  . oxybutynin (DITROPAN XL) 15 MG 24 hr tablet Completed Course  . allopurinol (ZYLOPRIM) 300 MG tablet Patient Preference  . bisoprolol-hydrochlorothiazide (ZIAC) 2.5-6.25 MG tablet Change in therapy     Current Outpatient Medications:  .  celecoxib (CELEBREX) 200 MG capsule, Take 200 mg by mouth as needed. , Disp: , Rfl:  .  cetirizine-pseudoephedrine (ZYRTEC-D) 5-120 MG per tablet, Take 1 tablet by mouth 2 (two) times daily as needed for allergies., Disp: , Rfl:  .  cyclobenzaprine (FLEXERIL) 10 MG tablet, Take 10 mg by mouth 3 (three) times daily as needed for muscle spasms., Disp: , Rfl:  .  loratadine-pseudoephedrine (CLARITIN-D 12-HOUR) 5-120 MG tablet, Take 1 tablet by mouth 2 (two) times daily., Disp: , Rfl:  .  NON FORMULARY, balance of nature, Disp: , Rfl:  .  testosterone cypionate (DEPOTESTOTERONE CYPIONATE) 200 MG/ML injection, Inject into the muscle every 21 ( twenty-one) days., Disp: , Rfl:  .  zolpidem (AMBIEN) 5 MG tablet, Take 10 mg by mouth at bedtime as needed for sleep. , Disp: , Rfl:  .  aspirin EC 81 MG tablet, Take 1 tablet (81 mg total) by mouth daily. Swallow whole., Disp: 90 tablet, Rfl: 3 .  losartan (COZAAR) 25 MG tablet, Take 1 tablet (25 mg total) by mouth every evening., Disp: 90 tablet, Rfl: 3 .  metoprolol succinate (TOPROL XL) 25 MG 24 hr tablet, Take 1 tablet (25 mg total) by mouth in the  morning. Hold if systolic blood pressure (top blood pressure number) less than 100 mmHg or heart rate less than 60 bpm (pulse)., Disp: 30 tablet, Rfl: 2 .  nitroGLYCERIN (NITROSTAT) 0.4 MG SL tablet, Place 1 tablet (0.4 mg total) under the tongue every 5 (five) minutes as needed for chest pain. If you require more than two tablets five minutes apart go to the nearest ER via EMS., Disp: 30 tablet, Rfl: 0  Orders Placed This Encounter  Procedures  . CBC with Differential/Platelet  . Basic metabolic panel    There are no Patient Instructions on file for this visit.   --Continue cardiac medications as reconciled in final medication list. --Return in about 4 weeks (around 06/09/2020) for post heart catheterization  follow up.. Or sooner if needed. --Continue follow-up with your primary care physician regarding the management of your other chronic comorbid conditions.  Patient's questions and concerns were addressed to his satisfaction. He voices understanding of the instructions provided during this encounter.   This note was created using a voice recognition software as a result there may be grammatical errors inadvertently enclosed that do not reflect the nature of this encounter. Every attempt is made to correct such errors.  Total time spent: 45 minutes to discuss the findings of the echocardiogram and nuclear stress test in the setting of the patient's symptoms and also discussing the risks, benefits, alternatives to left and right heart catheterization.  Tessa Lerner, [place], [facility]  Pager: [phone] Office: [phone]

## 2020-05-12 NOTE — H&P (View-Only) (Signed)
Date:  05/12/2020   ID:  Jae Dire, DOB [birth year], MRN [id number]  PCP:  Aida Puffer, MD  Cardiologist:  Tessa Lerner, DO, [facility] (established care 04/22/2020)  REASON FOR CONSULT: Shortness of breath  Date: 05/12/20 Last Office Visit: 04/22/2020  Chief Complaint  Patient presents with  . Shortness of Breath  . Chest Pain    Chest tightness  . Results    HPI  Ronald Parsons is a 64 y.o. male who presents to the office with a chief complaint of "shortness of breath, chest tightness, review test results." Patient's past medical history and cardiovascular risk factors include: Benign essential hypertension, former smoker, obesity due to excess calories, family history of coronary artery disease.  Patient was originally referred to the office at the request of his primary care provider back in June for evaluation of shortness of breath and chest tightness.  Patient states that his chest tightness usually occurs with effort related activities, located substernally, intensity 3 out of 10, does improve with rest.  He also has effort related dyspnea that has been more progressive over the last couple months.  He denies heart failure symptoms and clinically appears euvolemic.  Since last office visit he was recommended to undergo an echocardiogram and nuclear stress test for further risk stratification.  Patient states that he underwent left heart catheterization approximately 13 years ago with [facility] and was noted to have normal coronaries at that time.  Since last office visit patient states that his symptoms remain relatively stable without any significant improvement.  Patient did have an echocardiogram done which noted preserved left ventricular systolic function with grade 1 diastolic impairment and regional wall motion abnormality suggestive of hypokinesis involving the mid to distal inferoseptal segments.  Nuclear stress test was reported to be low risk but did note decreased tracer  uptake in the basal inferior inferoseptal segments worse on rest images compared to stress.  However overall sensitivity/specificity is reduced due to diaphragmatic attenuation artifact.  Of note, on the echocardiogram patient was noted to have aortic stenosis as well with a peak velocity of 2.5 m/s, mean gradient of 14 mmHg, and a dimensional index of 0.3.  Findings are more consistent with at least mildly AS.   Denies prior history of coronary artery disease, myocardial infarction, congestive heart failure, deep venous thrombosis, pulmonary embolism, stroke, transient ischemic attack.  FUNCTIONAL STATUS: Does landscaping regularly (mowing lawn, raking leaves, etc) but no structured exercise program or daily routine.   ALLERGIES: Allergies  Allergen Reactions  . Hydromorphone Hcl   . Penicillins Hives    As a child  . Lovastatin Other (See Comments)    Myalgia    MEDICATION LIST PRIOR TO VISIT: Current Meds  Medication Sig  . celecoxib (CELEBREX) 200 MG capsule Take 200 mg by mouth as needed.   . cetirizine-pseudoephedrine (ZYRTEC-D) 5-120 MG per tablet Take 1 tablet by mouth 2 (two) times daily as needed for allergies.  . cyclobenzaprine (FLEXERIL) 10 MG tablet Take 10 mg by mouth 3 (three) times daily as needed for muscle spasms.  Marland Kitchen loratadine-pseudoephedrine (CLARITIN-D 12-HOUR) 5-120 MG tablet Take 1 tablet by mouth 2 (two) times daily.  . NON FORMULARY balance of nature  . testosterone cypionate (DEPOTESTOTERONE CYPIONATE) 200 MG/ML injection Inject into the muscle every 21 ( twenty-one) days.  Marland Kitchen zolpidem (AMBIEN) 5 MG tablet Take 10 mg by mouth at bedtime as needed for sleep.   . [DISCONTINUED] bisoprolol-hydrochlorothiazide (ZIAC) 2.5-6.25 MG tablet Take 0.5 tablets  by mouth daily.      PAST MEDICAL HISTORY: Past Medical History:  Diagnosis Date  . Asthma    allergy related  . Cervical radiculitis   . Gout   . Hyperlipidemia   . Hypertension   . Lumbago   .  Osteoarthritis     PAST SURGICAL HISTORY: Past Surgical History:  Procedure Laterality Date  . BACK SURGERY  1991   laminectomy  . CARDIAC CATHETERIZATION     5-7 years ago, states it was clear  . COLONOSCOPY    . HERNIA REPAIR     inguinal hernia repair as a baby on right groin    FAMILY HISTORY: The patient family history includes Breast cancer in his sister; Emphysema in his paternal grandfather; Heart disease in his father; Hypertension in his father; Valvular heart disease in his maternal grandfather.  SOCIAL HISTORY:  The patient  reports that he quit smoking about 16 years ago. His smokeless tobacco use includes chew. He reports current alcohol use. He reports that he does not use drugs.  REVIEW OF SYSTEMS: Review of Systems  Constitutional: Negative for chills and fever.  HENT: Negative for hoarse voice and nosebleeds.   Eyes: Negative for discharge, double vision and pain.  Cardiovascular: Positive for chest pain and dyspnea on exertion. Negative for claudication, leg swelling, near-syncope, orthopnea, palpitations, paroxysmal nocturnal dyspnea and syncope.  Respiratory: Negative for hemoptysis and shortness of breath.   Musculoskeletal: Negative for muscle cramps and myalgias.  Gastrointestinal: Negative for abdominal pain, constipation, diarrhea, hematemesis, hematochezia, melena, nausea and vomiting.  Neurological: Negative for dizziness and light-headedness.    PHYSICAL EXAM: Vitals with BMI 05/12/2020 04/22/2020 09/18/2017  Height 5\' 9"  5\' 9"  5\' 9"   Weight 220 lbs 219 lbs 240 lbs  BMI 32.47 32.33 35.43  Systolic 135 131 -  Diastolic 90 84 -  Pulse 86 65 -    CONSTITUTIONAL: Well-developed and well-nourished. No acute distress.  SKIN: Skin is warm and dry. No rash noted. No cyanosis. No pallor. No jaundice HEAD: Normocephalic and atraumatic.  EYES: No scleral icterus MOUTH/THROAT: Moist oral membranes.  NECK: No JVD present. No thyromegaly noted. No carotid  bruits  LYMPHATIC: No visible cervical adenopathy.  CHEST Normal respiratory effort. No intercostal retractions  LUNGS: Clear to auscultation bilaterally. No stridor. No wheezes. No rales.  CARDIOVASCULAR: Regular rate and rhythm, positive S1-S2, soft systolic ejection murmur R2ICS 3/6, no rubs or gallops appreciated. ABDOMINAL: No apparent ascites.  EXTREMITIES: No peripheral edema, +2 DPT and PT, and varicose vein.  HEMATOLOGIC: No significant bruising NEUROLOGIC: Oriented to person, place, and time. Nonfocal. Normal muscle tone.  PSYCHIATRIC: Normal mood and affect. Normal behavior. Cooperative  CARDIAC DATABASE: EKG: 04/22/2020: Normal sinus rhythm, 68 bpm, poor R wave progression, without underlying ischemia or injury pattern.  Echocardiogram: 04/24/2020:  Normal LV systolic function with visual EF 55-60%. Left ventricle cavity is normal in size. Moderate left ventricular hypertrophy. Regional wall motion abnormalities with hypokinesis of mid to distal inferoseptal segments. Doppler evidence of grade I (impaired) diastolic dysfunction, normal LAP.  Mild aortic valve stenosis (peak velocity 2.37m/s, MG 04/24/2020, DI 0.3).  Mild (Grade I) mitral regurgitation.  Mild tricuspid regurgitation.  No prior study for comparison.   Stress Testing: Lexiscan/modified Bruce Tetrofosmin stress test 05/04/2020: Decreased tracer uptake in basal inferior/ inferoseptal myocardium, more prominent on rest images. This likely represents diaphragmatic attenuation, although ischemia in this region cannot be excluded. Stress LVEF 57%. Low risk study.   Heart Catheterization: 13 years  ago, no records available.   LABORATORY DATA: We will obtain records from his PCPs office.  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 Dyspnea on exertion  R06.00 CBC with Differential/Platelet    Basic metabolic panel  3. Benign hypertension  I10 losartan (COZAAR) 25 MG tablet  4. Former smoker  Z87.891   5. Class 1 obesity due to excess calories without serious comorbidity with body mass index (BMI) of 32.0 to 32.9 in adult  E66.09    Z68.32      RECOMMENDATIONS: Ronald Parsons is a 64 y.o. male whose past medical history and cardiac risk factors include: Benign essential hypertension, former smoker, obesity due to excess calories, family history of coronary artery disease.  Anginal equivalent:  Patient symptoms of chest tightness and effort related dyspnea are suggestive to be his anginal equivalent.  Echocardiogram noted a preserved left ventricular systolic function with regional wall motion abnormalities.    Nuclear stress test findings reviewed with the patient.  The overall sensitivity/specificity is hindered due to diaphragmatic attenuation.  Start aspirin 81 mg p.o. daily until the current work-up is complete.  Sublingual nitroglycerin tablets to use on as needed basis.  Medication profile discussed with the patient.  Patient states that he does not have access to phosphodiesterase 5 inhibitor medications such as: Viagra/sildenafil, Cialis/tadalafil, Levitra/sildenafil.  Discontinue Bystolic/hydrochlorothiazide  Start Toprol-XL 25 mg p.o. every morning.  Start losartan 25 mg p.o. every afternoon.  Discussed undergoing coronary CTA versus heart catheterization.  The shared decision was to proceed with left and right heart catheterization given his underlying chest tightness and effort related dyspnea.   The left heart catheterization procedure was explained to the patient in detail. The indication, alternatives, risks and benefits were reviewed. Complications including but not limited to bleeding, infection, acute kidney injury, blood transfusion, heart rhythm disturbances, contrast (dye)  reaction, damage to the arteries or nerves in the legs or hands, cerebrovascular accident, myocardial infarction, need for emergent bypass surgery, blood clots in the legs, possible need for emergent blood transfusion, and rarely death were reviewed and discussed with the patient. The patient voices understanding and wishes to proceed.   The right heart catheterization procedure was discussed in great detail with the patient.  Indications, alternatives, risks and benefits were reviewed.  Complications including but not limited to new bundle branch block, needing pacing, perforation, pulmonary hemorrhage.  Aortic stenosis: Continue to monitor.  Patient educated on the 3 cardinal signs of aortic stenosis.  May consider using Langston's catheter to evaluate the hemodynamics of the aortic valve as there appears to be a little bit of discrepancy between the hemodynamics from 2D echo and the dimensional index.  Requested labs from his PCPs office.  Essential hypertension: Well-controlled.  Currently managed by primary team.  Screening for hypercholesterolemia is being managed by primary team as per patient on a yearly basis at the physical.  Obesity, due to excess calories: Body mass index is 32.49 kg/m. . I reviewed with the patient the importance of diet, regular physical activity/exercise, weight loss.   . Patient is educated on increasing physical activity gradually as tolerated.  With the goal of moderate intensity exercise for 30 minutes a day 5 days a week.  FINAL MEDICATION LIST END OF ENCOUNTER: Meds ordered this encounter  Medications  . metoprolol succinate (TOPROL XL) 25 MG 24 hr tablet    Sig: Take 1 tablet (25 mg total) by mouth in the morning. Hold if systolic blood pressure (top blood pressure number) less than 100 mmHg or heart rate less than 60 bpm (pulse).    Dispense:  30 tablet    Refill:  2  . nitroGLYCERIN (NITROSTAT) 0.4 MG SL tablet    Sig: Place 1 tablet (0.4 mg total)  under the tongue every 5 (five) minutes as needed for chest pain. If you require more than two tablets five minutes apart go to the nearest ER via EMS.    Dispense:  30 tablet    Refill:  0  . aspirin EC 81 MG tablet    Sig: Take 1 tablet (81 mg total) by mouth daily. Swallow whole.    Dispense:  90 tablet    Refill:  3  . losartan (COZAAR) 25 MG tablet    Sig: Take 1 tablet (25 mg total) by mouth every evening.    Dispense:  90 tablet    Refill:  3    Medications Discontinued During This Encounter  Medication Reason  . colchicine 0.6 MG tablet Completed Course  . oxybutynin (DITROPAN XL) 15 MG 24 hr tablet Completed Course  . allopurinol (ZYLOPRIM) 300 MG tablet Patient Preference  . bisoprolol-hydrochlorothiazide (ZIAC) 2.5-6.25 MG tablet Change in therapy     Current Outpatient Medications:  .  celecoxib (CELEBREX) 200 MG capsule, Take 200 mg by mouth as needed. , Disp: , Rfl:  .  cetirizine-pseudoephedrine (ZYRTEC-D) 5-120 MG per tablet, Take 1 tablet by mouth 2 (two) times daily as needed for allergies., Disp: , Rfl:  .  cyclobenzaprine (FLEXERIL) 10 MG tablet, Take 10 mg by mouth 3 (three) times daily as needed for muscle spasms., Disp: , Rfl:  .  loratadine-pseudoephedrine (CLARITIN-D 12-HOUR) 5-120 MG tablet, Take 1 tablet by mouth 2 (two) times daily., Disp: , Rfl:  .  NON FORMULARY, balance of nature, Disp: , Rfl:  .  testosterone cypionate (DEPOTESTOTERONE CYPIONATE) 200 MG/ML injection, Inject into the muscle every 21 ( twenty-one) days., Disp: , Rfl:  .  zolpidem (AMBIEN) 5 MG tablet, Take 10 mg by mouth at bedtime as needed for sleep. , Disp: , Rfl:  .  aspirin EC 81 MG tablet, Take 1 tablet (81 mg total) by mouth daily. Swallow whole., Disp: 90 tablet, Rfl: 3 .  losartan (COZAAR) 25 MG tablet, Take 1 tablet (25 mg total) by mouth every evening., Disp: 90 tablet, Rfl: 3 .  metoprolol succinate (TOPROL XL) 25 MG 24 hr tablet, Take 1 tablet (25 mg total) by mouth in the  morning. Hold if systolic blood pressure (top blood pressure number) less than 100 mmHg or heart rate less than 60 bpm (pulse)., Disp: 30 tablet, Rfl: 2 .  nitroGLYCERIN (NITROSTAT) 0.4 MG SL tablet, Place 1 tablet (0.4 mg total) under the tongue every 5 (five) minutes as needed for chest pain. If you require more than two tablets five minutes apart go to the nearest ER via EMS., Disp: 30 tablet, Rfl: 0  Orders Placed This Encounter  Procedures  . CBC with Differential/Platelet  . Basic metabolic panel    There are no Patient Instructions on file for this visit.   --Continue cardiac medications as reconciled in final medication list. --Return in about 4 weeks (around 06/09/2020) for post heart catheterization  follow up.. Or sooner if needed. --Continue follow-up with your primary care physician regarding the management of your other chronic comorbid conditions.  Patient's questions and concerns were addressed to his satisfaction. He voices understanding of the instructions provided during this encounter.   This note was created using a voice recognition software as a result there may be grammatical errors inadvertently enclosed that do not reflect the nature of this encounter. Every attempt is made to correct such errors.  Total time spent: 45 minutes to discuss the findings of the echocardiogram and nuclear stress test in the setting of the patient's symptoms and also discussing the risks, benefits, alternatives to left and right heart catheterization.  Tessa Lerner, [place], [facility]  Pager: [phone] Office: [phone]

## 2020-06-04 ENCOUNTER — Ambulatory Visit: Payer: Medicare Other | Admitting: Cardiology

## 2020-06-05 ENCOUNTER — Other Ambulatory Visit (HOSPITAL_COMMUNITY): Payer: Self-pay

## 2020-06-08 ENCOUNTER — Other Ambulatory Visit (HOSPITAL_COMMUNITY)
Admission: RE | Admit: 2020-06-08 | Discharge: 2020-06-08 | Disposition: A | Discharge status: Home / Self Care | Payer: Medicare Other | Source: Ambulatory Visit | Attending: Cardiology | Admitting: Cardiology

## 2020-06-08 DIAGNOSIS — Z20822 Contact with and (suspected) exposure to covid-19: Secondary | ICD-10-CM | POA: Insufficient documentation

## 2020-06-08 DIAGNOSIS — Z01812 Encounter for preprocedural laboratory examination: Secondary | ICD-10-CM | POA: Insufficient documentation

## 2020-06-08 LAB — SARS CORONAVIRUS 2 (TAT 6-24 HRS): SARS Coronavirus 2: NEGATIVE

## 2020-06-09 ENCOUNTER — Encounter (HOSPITAL_COMMUNITY)
Admission: RE | Disposition: A | Discharge status: Home / Self Care | Payer: Self-pay | Source: Ambulatory Visit | Attending: Cardiology

## 2020-06-09 ENCOUNTER — Ambulatory Visit (HOSPITAL_COMMUNITY)
Admission: RE | Admit: 2020-06-09 | Discharge: 2020-06-09 | Disposition: A | Discharge status: Home / Self Care | Payer: Medicare Other | Source: Ambulatory Visit | Attending: Cardiology | Admitting: Cardiology

## 2020-06-09 ENCOUNTER — Other Ambulatory Visit: Payer: Self-pay

## 2020-06-09 DIAGNOSIS — E6609 Other obesity due to excess calories: Secondary | ICD-10-CM | POA: Diagnosis not present

## 2020-06-09 DIAGNOSIS — Z7982 Long term (current) use of aspirin: Secondary | ICD-10-CM | POA: Diagnosis not present

## 2020-06-09 DIAGNOSIS — Z79899 Other long term (current) drug therapy: Secondary | ICD-10-CM | POA: Diagnosis not present

## 2020-06-09 DIAGNOSIS — Z6832 Body mass index (BMI) 32.0-32.9, adult: Secondary | ICD-10-CM | POA: Insufficient documentation

## 2020-06-09 DIAGNOSIS — I25119 Atherosclerotic heart disease of native coronary artery with unspecified angina pectoris: Secondary | ICD-10-CM | POA: Insufficient documentation

## 2020-06-09 DIAGNOSIS — I10 Essential (primary) hypertension: Secondary | ICD-10-CM | POA: Insufficient documentation

## 2020-06-09 DIAGNOSIS — Z87891 Personal history of nicotine dependence: Secondary | ICD-10-CM | POA: Insufficient documentation

## 2020-06-09 DIAGNOSIS — R0600 Dyspnea, unspecified: Secondary | ICD-10-CM | POA: Insufficient documentation

## 2020-06-09 DIAGNOSIS — I350 Nonrheumatic aortic (valve) stenosis: Secondary | ICD-10-CM | POA: Insufficient documentation

## 2020-06-09 DIAGNOSIS — Z8249 Family history of ischemic heart disease and other diseases of the circulatory system: Secondary | ICD-10-CM | POA: Diagnosis not present

## 2020-06-09 DIAGNOSIS — E785 Hyperlipidemia, unspecified: Secondary | ICD-10-CM | POA: Insufficient documentation

## 2020-06-09 DIAGNOSIS — I209 Angina pectoris, unspecified: Secondary | ICD-10-CM | POA: Diagnosis present

## 2020-06-09 DIAGNOSIS — M109 Gout, unspecified: Secondary | ICD-10-CM | POA: Diagnosis not present

## 2020-06-09 DIAGNOSIS — R0609 Other forms of dyspnea: Secondary | ICD-10-CM | POA: Diagnosis present

## 2020-06-09 HISTORY — PX: RIGHT/LEFT HEART CATH AND CORONARY ANGIOGRAPHY: CATH118266

## 2020-06-09 LAB — CBC
HCT: 58.7 % — ABNORMAL HIGH (ref 39.0–52.0)
Hemoglobin: 19.3 g/dL — ABNORMAL HIGH (ref 13.0–17.0)
MCH: 31.5 pg (ref 26.0–34.0)
MCHC: 32.9 g/dL (ref 30.0–36.0)
MCV: 95.8 fL (ref 80.0–100.0)
Platelets: 188 10*3/uL (ref 150–400)
RBC: 6.13 MIL/uL — ABNORMAL HIGH (ref 4.22–5.81)
RDW: 13.2 % (ref 11.5–15.5)
WBC: 8.5 10*3/uL (ref 4.0–10.5)
nRBC: 0 % (ref 0.0–0.2)

## 2020-06-09 LAB — POCT I-STAT EG7
Acid-Base Excess: 0 mmol/L (ref 0.0–2.0)
Acid-base deficit: 1 mmol/L (ref 0.0–2.0)
Acid-base deficit: 1 mmol/L (ref 0.0–2.0)
Bicarbonate: 25.1 mmol/L (ref 20.0–28.0)
Bicarbonate: 25.3 mmol/L (ref 20.0–28.0)
Bicarbonate: 25.9 mmol/L (ref 20.0–28.0)
Calcium, Ion: 1.21 mmol/L (ref 1.15–1.40)
Calcium, Ion: 1.18 mmol/L (ref 1.15–1.40)
Calcium, Ion: 1.19 mmol/L (ref 1.15–1.40)
HCT: 58 % — ABNORMAL HIGH (ref 39.0–52.0)
HCT: 57 % — ABNORMAL HIGH (ref 39.0–52.0)
HCT: 58 % — ABNORMAL HIGH (ref 39.0–52.0)
Hemoglobin: 19.7 g/dL — ABNORMAL HIGH (ref 13.0–17.0)
Hemoglobin: 19.4 g/dL — ABNORMAL HIGH (ref 13.0–17.0)
Hemoglobin: 19.7 g/dL — ABNORMAL HIGH (ref 13.0–17.0)
O2 Saturation: 73 %
O2 Saturation: 71 %
O2 Saturation: 72 %
Potassium: 3.8 mmol/L (ref 3.5–5.1)
Potassium: 3.6 mmol/L (ref 3.5–5.1)
Potassium: 3.7 mmol/L (ref 3.5–5.1)
Sodium: 140 mmol/L (ref 135–145)
Sodium: 140 mmol/L (ref 135–145)
Sodium: 140 mmol/L (ref 135–145)
TCO2: 26 mmol/L (ref 22–32)
TCO2: 27 mmol/L (ref 22–32)
TCO2: 27 mmol/L (ref 22–32)
pCO2, Ven: 45.8 mmHg (ref 44.0–60.0)
pCO2, Ven: 45.3 mmHg (ref 44.0–60.0)
pCO2, Ven: 46 mmHg (ref 44.0–60.0)
pH, Ven: 7.346 (ref 7.250–7.430)
pH, Ven: 7.349 (ref 7.250–7.430)
pH, Ven: 7.365 (ref 7.250–7.430)
pO2, Ven: 41 mmHg (ref 32.0–45.0)
pO2, Ven: 39 mmHg (ref 32.0–45.0)
pO2, Ven: 40 mmHg (ref 32.0–45.0)

## 2020-06-09 LAB — POCT I-STAT, CHEM 8
BUN: 12 mg/dL (ref 8–23)
Calcium, Ion: 1.12 mmol/L — ABNORMAL LOW (ref 1.15–1.40)
Chloride: 102 mmol/L (ref 98–111)
Creatinine, Ser: 0.9 mg/dL (ref 0.61–1.24)
Glucose, Bld: 103 mg/dL — ABNORMAL HIGH (ref 70–99)
HCT: 58 % — ABNORMAL HIGH (ref 39.0–52.0)
Hemoglobin: 19.7 g/dL — ABNORMAL HIGH (ref 13.0–17.0)
Potassium: 3.8 mmol/L (ref 3.5–5.1)
Sodium: 138 mmol/L (ref 135–145)
TCO2: 24 mmol/L (ref 22–32)

## 2020-06-09 LAB — BASIC METABOLIC PANEL
Anion gap: 10 (ref 5–15)
BUN: 11 mg/dL (ref 8–23)
CO2: 23 mmol/L (ref 22–32)
Calcium: 9.1 mg/dL (ref 8.9–10.3)
Chloride: 101 mmol/L (ref 98–111)
Creatinine, Ser: 0.91 mg/dL (ref 0.61–1.24)
GFR calc Af Amer: >60 mL/min (ref >60)
GFR calc non Af Amer: >60 mL/min (ref >60)
Glucose, Bld: 98 mg/dL (ref 70–99)
Potassium: 6 mmol/L — ABNORMAL HIGH (ref 3.5–5.1)
Sodium: 134 mmol/L — ABNORMAL LOW (ref 135–145)

## 2020-06-09 SURGERY — RIGHT/LEFT HEART CATH AND CORONARY ANGIOGRAPHY
Anesthesia: LOCAL

## 2020-06-09 MED ORDER — MIDAZOLAM HCL 2 MG/2ML IJ SOLN
INTRAMUSCULAR | Status: DC | PRN
  Administered 2020-06-09: 1 mg via INTRAVENOUS
  Administered 2020-06-09: 2 mg via INTRAVENOUS

## 2020-06-09 MED ORDER — MIDAZOLAM HCL 2 MG/2ML IJ SOLN
INTRAMUSCULAR | Status: AC
  Filled 2020-06-09: qty 2

## 2020-06-09 MED ORDER — SODIUM CHLORIDE 0.9 % WEIGHT BASED INFUSION
97.5000 mL/h | INTRAVENOUS | Status: DC
  Administered 2020-06-09: 500 mL via INTRAVENOUS

## 2020-06-09 MED ORDER — LIDOCAINE HCL (PF) 1 % IJ SOLN
INTRAMUSCULAR | Status: AC
  Filled 2020-06-09: qty 30

## 2020-06-09 MED ORDER — ONDANSETRON HCL 4 MG/2ML IJ SOLN: 4.0000 mg | Freq: Four times a day (QID) | INTRAMUSCULAR | Status: DC | PRN

## 2020-06-09 MED ORDER — SODIUM CHLORIDE 0.9% FLUSH: 3.0000 mL | Freq: Two times a day (BID) | INTRAVENOUS | Status: DC

## 2020-06-09 MED ORDER — ASPIRIN 81 MG PO CHEW: 81.0000 mg | CHEWABLE_TABLET | ORAL | Status: DC

## 2020-06-09 MED ORDER — HYDRALAZINE HCL 20 MG/ML IJ SOLN: 10.0000 mg | INTRAMUSCULAR | Status: DC | PRN

## 2020-06-09 MED ORDER — SODIUM CHLORIDE 0.9 % WEIGHT BASED INFUSION: 292.5000 mL/h | INTRAVENOUS | Status: AC

## 2020-06-09 MED ORDER — HEPARIN (PORCINE) IN NACL 1000-0.9 UT/500ML-% IV SOLN
INTRAVENOUS | Status: AC
  Filled 2020-06-09: qty 1000

## 2020-06-09 MED ORDER — VERAPAMIL HCL 2.5 MG/ML IV SOLN
INTRAVENOUS | Status: AC
  Filled 2020-06-09: qty 2

## 2020-06-09 MED ORDER — SODIUM CHLORIDE 0.9% FLUSH: 3.0000 mL | INTRAVENOUS | Status: DC | PRN

## 2020-06-09 MED ORDER — ACETAMINOPHEN 325 MG PO TABS: 650.0000 mg | ORAL_TABLET | ORAL | Status: DC | PRN

## 2020-06-09 MED ORDER — SODIUM CHLORIDE 0.9 % IV SOLN: 250.0000 mL | INTRAVENOUS | Status: DC | PRN

## 2020-06-09 MED ORDER — FENTANYL CITRATE (PF) 100 MCG/2ML IJ SOLN
INTRAMUSCULAR | Status: AC
  Filled 2020-06-09: qty 2

## 2020-06-09 MED ORDER — SODIUM CHLORIDE 0.9 % WEIGHT BASED INFUSION: 1.0000 mL/kg/h | INTRAVENOUS | Status: DC

## 2020-06-09 MED ORDER — HEPARIN (PORCINE) IN NACL 1000-0.9 UT/500ML-% IV SOLN
INTRAVENOUS | Status: DC | PRN
  Administered 2020-06-09 (×2): 500 mL

## 2020-06-09 MED ORDER — ASPIRIN 81 MG PO CHEW
CHEWABLE_TABLET | ORAL | Status: AC
  Administered 2020-06-09: 81 mg
  Filled 2020-06-09: qty 1

## 2020-06-09 MED ORDER — HEPARIN SODIUM (PORCINE) 1000 UNIT/ML IJ SOLN
INTRAMUSCULAR | Status: AC
  Filled 2020-06-09: qty 1

## 2020-06-09 MED ORDER — FENTANYL CITRATE (PF) 100 MCG/2ML IJ SOLN
INTRAMUSCULAR | Status: DC | PRN
  Administered 2020-06-09 (×2): 25 ug via INTRAVENOUS

## 2020-06-09 MED ORDER — VERAPAMIL HCL 2.5 MG/ML IV SOLN
INTRAVENOUS | Status: DC | PRN
  Administered 2020-06-09: 10 mL via INTRA_ARTERIAL

## 2020-06-09 MED ORDER — HEPARIN SODIUM (PORCINE) 1000 UNIT/ML IJ SOLN
INTRAMUSCULAR | Status: DC | PRN
  Administered 2020-06-09: 5000 [IU] via INTRAVENOUS

## 2020-06-09 MED ORDER — LIDOCAINE HCL (PF) 1 % IJ SOLN
INTRAMUSCULAR | Status: DC | PRN
  Administered 2020-06-09: 2 mL

## 2020-06-09 MED ORDER — IOHEXOL 350 MG/ML SOLN
INTRAVENOUS | Status: DC | PRN
  Administered 2020-06-09: 60 mL via INTRA_ARTERIAL

## 2020-06-09 SURGICAL SUPPLY — 11 items
CATH BALLN WEDGE 5F 110CM (CATHETERS) ×2 IMPLANT
CATH OPTITORQUE TIG 4.0 5F (CATHETERS) ×2 IMPLANT
DEVICE RAD COMP TR BAND LRG (VASCULAR PRODUCTS) ×2 IMPLANT
GLIDESHEATH SLEND A-KIT 6F 22G (SHEATH) ×2 IMPLANT
GUIDEWIRE INQWIRE 1.5J.035X260 (WIRE) ×1 IMPLANT
INQWIRE 1.5J .035X260CM (WIRE) ×2
KIT HEART LEFT (KITS) ×2 IMPLANT
PACK CARDIAC CATHETERIZATION (CUSTOM PROCEDURE TRAY) ×2 IMPLANT
SHEATH GLIDE SLENDER 4/5FR (SHEATH) ×2 IMPLANT
TRANSDUCER W/STOPCOCK (MISCELLANEOUS) ×2 IMPLANT
TUBING CIL FLEX 10 FLL-RA (TUBING) ×2 IMPLANT

## 2020-06-09 NOTE — Progress Notes (Signed)
Patient was given discharge instructions. He verbalized understanding. 

## 2020-06-09 NOTE — Research (Signed)
[place] Informed Consent   Subject Name: Ronald Parsons  Subject met inclusion and exclusion criteria.  The informed consent form, study requirements and expectations were reviewed with the subject and questions and concerns were addressed prior to the signing of the consent form.  The subject verbalized understanding of the trial requirements.  The subject agreed to participate in the [facility] trial and signed the informed consent at [place] on 06/09/2020.  The informed consent was obtained prior to performance of any protocol-specific procedures for the subject.  A copy of the signed informed consent was given to the subject and a copy was placed in the subject's medical record.   [name]

## 2020-06-09 NOTE — Interval H&P Note (Signed)
History and Physical Interval Note:  06/09/2020 11:09 AM  Ronald Parsons  has presented today for surgery, with the diagnosis of hp.  The various methods of treatment have been discussed with the patient and family. After consideration of risks, benefits and other options for treatment, the patient has consented to  Procedure(s): RIGHT/LEFT HEART CATH AND CORONARY ANGIOGRAPHY (N/A) and possible coronary intervention as a surgical intervention.  The patient's history has been reviewed, patient examined, no change in status, stable for surgery.  I have reviewed the patient's chart and labs.  Questions were answered to the patient's satisfaction.    Cath Lab Visit (complete for each Cath Lab visit)  Clinical Evaluation Leading to the Procedure:   ACS: No.  Non-ACS:    Anginal Classification: CCS III  Anti-ischemic medical therapy: Minimal Therapy (1 class of medications)  Non-Invasive Test Results: Intermediate-risk stress test findings: cardiac mortality 1-3%/year  Prior CABG: No previous CABG    Yates Decamp

## 2020-06-09 NOTE — Discharge Instructions (Signed)
Radial Site Care  This sheet gives you information about how to care for yourself after your procedure. Your health care provider may also give you more specific instructions. If you have problems or questions, contact your health care provider. What can I expect after the procedure? After the procedure, it is common to have:  Bruising and tenderness at the catheter insertion area. Follow these instructions at home: Medicines  Take over-the-counter and prescription medicines only as told by your health care provider. Insertion site care  Follow instructions from your health care provider about how to take care of your insertion site. Make sure you: ? Wash your hands with soap and water before you change your bandage (dressing). If soap and water are not available, use hand sanitizer. ? Change your dressing as told by your health care provider. ? Leave stitches (sutures), skin glue, or adhesive strips in place. These skin closures may need to stay in place for 2 weeks or longer. If adhesive strip edges start to loosen and curl up, you may trim the loose edges. Do not remove adhesive strips completely unless your health care provider tells you to do that.  Check your insertion site every day for signs of infection. Check for: ? Redness, swelling, or pain. ? Fluid or blood. ? Pus or a bad smell. ? Warmth.  Do not take baths, swim, or use a hot tub until your health care provider approves.  You may shower 24-48 hours after the procedure, or as directed by your health care provider. ? Remove the dressing and gently wash the site with plain soap and water. ? Pat the area dry with a clean towel. ? Do not rub the site. That could cause bleeding.  Do not apply powder or lotion to the site. Activity   For 24 hours after the procedure, or as directed by your health care provider: ? Do not flex or bend the affected arm. ? Do not push or pull heavy objects with the affected arm. ? Do not  drive yourself home from the hospital or clinic. You may drive 24 hours after the procedure unless your health care provider tells you not to. ? Do not operate machinery or power tools.  Do not lift anything that is heavier than 10 lb (4.5 kg), or the limit that you are told, until your health care provider says that it is safe.  Ask your health care provider when it is okay to: ? Return to work or school. ? Resume usual physical activities or sports. ? Resume sexual activity. General instructions  If the catheter site starts to bleed, raise your arm and put firm pressure on the site. If the bleeding does not stop, get help right away. This is a medical emergency.  If you went home on the same day as your procedure, a responsible adult should be with you for the first 24 hours after you arrive home.  Keep all follow-up visits as told by your health care provider. This is important. Contact a health care provider if:  You have a fever.  You have redness, swelling, or yellow drainage around your insertion site. Get help right away if:  You have unusual pain at the radial site.  The catheter insertion area swells very fast.  The insertion area is bleeding, and the bleeding does not stop when you hold steady pressure on the area.  Your arm or hand becomes pale, cool, tingly, or numb. These symptoms may represent a serious problem   that is an emergency. Do not wait to see if the symptoms will go away. Get medical help right away. Call your local emergency services (911 in the [place] Do not drive yourself to the hospital. Summary  After the procedure, it is common to have bruising and tenderness at the site.  Follow instructions from your health care provider about how to take care of your radial site wound. Check the wound every day for signs of infection.  Do not lift anything that is heavier than 10 lb (4.5 kg), or the limit that you are told, until your health care provider says  that it is safe. This information is not intended to replace advice given to you by your health care provider. Make sure you discuss any questions you have with your health care provider. Document Revised: 11/15/2017 Document Reviewed: 11/15/2017 Elsevier Patient Education  2020 [employer]  

## 2020-06-10 ENCOUNTER — Encounter (HOSPITAL_COMMUNITY): Payer: Self-pay | Admitting: Cardiology

## 2020-06-16 ENCOUNTER — Ambulatory Visit: Payer: Medicare Other | Admitting: Cardiology

## 2021-09-30 ENCOUNTER — Ambulatory Visit: Payer: Medicare Other | Admitting: Nurse Practitioner

## 2021-10-07 ENCOUNTER — Ambulatory Visit (INDEPENDENT_AMBULATORY_CARE_PROVIDER_SITE_OTHER): Payer: No Typology Code available for payment source | Admitting: Nurse Practitioner

## 2021-10-07 ENCOUNTER — Encounter: Payer: Self-pay | Admitting: Nurse Practitioner

## 2021-10-07 ENCOUNTER — Other Ambulatory Visit: Payer: Self-pay

## 2021-10-07 VITALS — BP 137/88 | HR 89 | Temp 98.1°F | Ht 69.0 in | Wt 241.1 lb

## 2021-10-07 DIAGNOSIS — M4716 Other spondylosis with myelopathy, lumbar region: Secondary | ICD-10-CM

## 2021-10-07 DIAGNOSIS — I10 Essential (primary) hypertension: Secondary | ICD-10-CM

## 2021-10-07 DIAGNOSIS — Z7689 Persons encountering health services in other specified circumstances: Secondary | ICD-10-CM | POA: Diagnosis not present

## 2021-10-07 DIAGNOSIS — R7989 Other specified abnormal findings of blood chemistry: Secondary | ICD-10-CM

## 2021-10-07 DIAGNOSIS — Z6835 Body mass index (BMI) 35.0-35.9, adult: Secondary | ICD-10-CM

## 2021-10-07 DIAGNOSIS — F5101 Primary insomnia: Secondary | ICD-10-CM

## 2021-10-07 MED ORDER — BISOPROLOL-HYDROCHLOROTHIAZIDE 2.5-6.25 MG PO TABS: 0.5000 | ORAL_TABLET | Freq: Every day | ORAL | 3 refills | Status: DC

## 2021-10-07 MED ORDER — OXYCODONE HCL 15 MG PO TABS: ORAL_TABLET | ORAL | 0 refills | Status: DC

## 2021-10-07 MED ORDER — ZOLPIDEM TARTRATE 10 MG PO TABS: 10.0000 mg | ORAL_TABLET | Freq: Every day | ORAL | 3 refills | Status: DC

## 2021-10-07 NOTE — Progress Notes (Signed)
New Patient Office Visit  Subjective:  Patient ID: Ronald Parsons, male    DOB: 25-Jan-1956  Age: 65 y.o. MRN: [id number]  CC:  Chief Complaint  Patient presents with   New Patient (Initial Visit)     HPI Ronald Parsons presents to establish new primary care provider. He is coming from provider who is getting ready to retire. He is due to have routine, fasting labs. He is due to have routine physical exam.  Blood pressure is well managed.  He would like to get flu shot, however, he is just getting over COVID 19. Was diagnosed this time last week. Does still have congested.  Takes testosterone injections and does not remember the last time he had levels checked. Currently gets injections every four weeks.  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 This is causing overgrowth of many of his bones. Ribs on one side of the body are about double the size as the other. This does cause him quite a bit of pain. He does have moderate to severe bony pain from this. He currently has prescription for oxycodone 15mg  tablets. He cuts these in half and takes only when needed. He also takes celebrex. He takes this twice daily.  He would like to get screening colonoscopy.    Past Medical History:  Diagnosis Date   Asthma    allergy related   Cervical radiculitis    Gout    Hyperlipidemia    Hypertension    Lumbago    Osteoarthritis     Past Surgical History:  Procedure Laterality Date   BACK SURGERY  1991   laminectomy   CARDIAC CATHETERIZATION     5-7 years ago, states it was clear   COLONOSCOPY     HERNIA REPAIR     inguinal hernia repair as a baby on right groin   RIGHT/LEFT HEART CATH AND CORONARY ANGIOGRAPHY N/A 06/09/2020   Procedure: RIGHT/LEFT HEART CATH AND CORONARY ANGIOGRAPHY;  Surgeon: 06/11/2020, MD;  Location: [facility];  Service: Cardiovascular;  Laterality: N/A;    Family History  Problem Relation Age of Onset   Heart  disease Father    Hypertension Father    Breast cancer Sister    Valvular heart disease Maternal Grandfather    Emphysema Paternal Grandfather     Social History   Socioeconomic History   Marital status: Married    Spouse name: Not on file   Number of children: Not on file   Years of education: Not on file   Highest education level: Not on file  Occupational History   Not on file  Tobacco Use   Smoking status: Former   Smokeless tobacco: Current    Types: Chew  Vaping Use   Vaping Use: Never used  Substance and Sexual Activity   Alcohol use: Yes    Comment: weekly   Drug use: No   Sexual activity: Not Currently  Other Topics Concern   Not on file  Social History Narrative   Not on file   Social Determinants of Health   Financial Resource Strain: Not on file  Food Insecurity: Not on file  Transportation Needs: Not on file  Physical Activity: Not on file  Stress: Not on file  Social Connections: Not on file  Intimate Partner Violence: Not on file    ROS Review of Systems  Constitutional:  Negative for activity change, chills, fatigue and fever.  HENT:  Negative for congestion,  postnasal drip, rhinorrhea, sinus pressure, sinus pain, sneezing and sore throat.   Eyes: Negative.   Respiratory:  Negative for cough, shortness of breath and wheezing.   Cardiovascular:  Negative for chest pain and palpitations.  Gastrointestinal:  Negative for constipation, diarrhea, nausea and vomiting.  Endocrine: Negative for cold intolerance, heat intolerance, polydipsia and polyuria.  Genitourinary:  Negative for dysuria, frequency and urgency.  Musculoskeletal:  Positive for arthralgias, back pain and myalgias.       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   Skin:  Negative for rash.  Allergic/Immunologic: Positive for environmental allergies.  Neurological:  Negative for dizziness, speech difficulty, weakness and headaches.  Psychiatric/Behavioral:   Positive for sleep disturbance. Negative for suicidal ideas. The patient is not nervous/anxious.    Objective:   Today's Vitals   10/07/21 0858  BP: 137/88  Pulse: 89  Temp: 98.1 F (36.7 C)  SpO2: 94%  Weight: 241 lb 1.9 oz (109.4 kg)  Height:  (1.753 m)   Body mass index is 35.61 kg/m.   Physical Exam Vitals and nursing note reviewed.  Constitutional:      Appearance: Normal appearance. He is well-developed. He is obese.  HENT:     Head: Normocephalic and atraumatic.  Eyes:     Pupils: Pupils are equal, round, and reactive to light.  Neck:     Vascular: No carotid bruit.  Cardiovascular:     Rate and Rhythm: Normal rate and regular rhythm.     Pulses: Normal pulses.     Heart sounds: Normal heart sounds.  Pulmonary:     Effort: Pulmonary effort is normal.     Breath sounds: Normal breath sounds.  Abdominal:     Palpations: Abdomen is soft.  Musculoskeletal:        General: Normal range of motion.     Cervical back: Normal range of motion and neck supple.  Lymphadenopathy:     Cervical: No cervical adenopathy.  Skin:    General: Skin is warm and dry.     Capillary Refill: Capillary refill takes less than 2 seconds.  Neurological:     General: No focal deficit present.     Mental Status: He is alert and oriented to person, place, and time.  Psychiatric:        Mood and Affect: Mood normal.        Behavior: Behavior normal.        Thought Content: Thought content normal.        Judgment: Judgment normal.   Assessment & Plan:  1. Encounter to establish care Appointment today to establish new primary care provider.  We will get records from previous primary care provider to review and update patient chart.  2. Essential hypertension Stable.  Continue blood pressure medication as prescribed.  New prescription sent to his pharmacy today.  She - bisoprolol-hydrochlorothiazide (ZIAC) 2.5-6.25 MG tablet; Take 0.5-1 tablets by mouth daily.  Dispense: 30 tablet;  Refill: 3  3. Low testosterone Will check testosterone level.  Adjust dosing of testosterone injections as indicated.  4. Primary insomnia May continue Ambien 10 mg tablets at bedtime as needed for insomnia.  A new prescription was sent to his pharmacy today. - zolpidem (AMBIEN) 10 MG tablet; Take 1 tablet (10 mg total) by mouth at bedtime.  Dispense: 30 tablet; Refill: 3  5. 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  We will send  single prescription for #15 oxycodone 15 mg tablets to his pharmacy.  Recommended to be taken only as needed and as prescribed.  Reviewed risk factors and potential side effects associated with taking narcotic pain medications.  Advised not to drive or to operate machinery after taking this medication.  He did voice understanding and agreement with this plan.  An agreement for opioid treatment primary care at [facility] was reviewed and signed by patient today.  His PDMP profile was reviewed today.  His overdose risk score is 240.  His fill history is appropriate. - oxyCODONE (ROXICODONE) 15 MG immediate release tablet; Take 1/2 tablet po qD prn  Dispense: 15 tablet; Refill: 0  6. Body mass index (BMI) of 35.0-35.9 in adult Encourage patient to limit calorie intake to 2000 cal/day or less.  He should consume a low cholesterol, low-fat diet.  Patient should incorporate exercise into his daily routine.    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 Swallow whole.   celecoxib (CELEBREX) 200 MG capsule Take 200 mg by mouth daily as needed for mild pain or moderate pain.    cetirizine-pseudoephedrine (ZYRTEC-D) 5-120 MG per tablet Take 1 tablet by mouth 2 (two) times daily as needed for allergies.   cyclobenzaprine (FLEXERIL) 10 MG tablet Take 10 mg by mouth 3 (three) times daily as needed for muscle spasms.   loratadine-pseudoephedrine (CLARITIN-D 12-HOUR) 5-120 MG tablet Take 1 tablet by mouth 2 (two) times daily.   NON FORMULARY balance of nature   testosterone cypionate (DEPOTESTOTERONE CYPIONATE) 200 MG/ML injection Inject 200 mg into the muscle every 21 ( twenty-one) days.    [DISCONTINUED] bisoprolol-hydrochlorothiazide (ZIAC) 2.5-6.25 MG tablet Take 0.5-1 tablets by mouth daily.   [DISCONTINUED] oxyCODONE (ROXICODONE) 15 MG immediate release tablet Take 15 mg by mouth every 6 (six) hours as needed for pain.   [DISCONTINUED] oxyCODONE HCl 7.5 MG TABS 1 tablet as needed   [DISCONTINUED] zolpidem (AMBIEN) 10 MG tablet Take 10 mg by mouth at bedtime.   bisoprolol-hydrochlorothiazide (ZIAC) 2.5-6.25 MG tablet Take 0.5-1 tablets by mouth daily.   losartan (COZAAR) 25 MG tablet Take 1 tablet (25 mg total) by mouth every evening. (Patient not taking: Reported on 05/27/2020)   nitroGLYCERIN (NITROSTAT) 0.4 MG SL tablet Place 1 tablet (0.4 mg total) under the tongue every 5 (five) minutes as needed for chest pain. If you require more than two tablets five minutes apart go to the nearest ER via EMS.   oxyCODONE (ROXICODONE) 15 MG immediate release tablet Take 1/2 tablet po qD prn   zolpidem (AMBIEN) 10 MG tablet Take 1 tablet (10 mg total) by mouth at bedtime.   [DISCONTINUED] metoprolol succinate (TOPROL XL) 25 MG 24 hr tablet  Take  1 tablet (25 mg total) by mouth in the morning. Hold if systolic blood pressure (top blood pressure number) less than 100 mmHg or heart rate less than 60 bpm (pulse). (Patient not taking: Reported on 05/27/2020)   No facility-administered encounter medications on file as of 10/07/2021.    Follow-up: Return in about 4 weeks (around 11/04/2021) for health maintenance exam, FBW a week prior to visit with PSA and testosteron - see below.   Carlean Jews, NP  This note was dictated using Conservation officer, historic buildings. Rapid proofreading was performed to expedite the delivery of the information. Despite proofreading, phonetic errors will occur which are common with this voice recognition software. Please take this into consideration. If there are any concerns, please contact our office.

## 2021-10-07 NOTE — Patient Instructions (Signed)
Fat and Cholesterol Restricted Eating Plan Getting too much fat and cholesterol in your diet may cause health problems. Choosing the right foods helps keep your fat and cholesterol at normal levels. This can keep you from getting certain diseases. Your doctor may recommend an eating plan that includes: Total fat: ______% or less of total calories a day. This is ______g of fat a day. Saturated fat: ______% or less of total calories a day. This is ______g of saturated fat a day. Cholesterol: less than _________mg a day. Fiber: ______g a day. What are tips for following this plan? General tips Work with your doctor to lose weight if you need to. Avoid: Foods with added sugar. Fried foods. Foods with trans fat or partially hydrogenated oils. This includes some margarines and baked goods. If you drink alcohol: Limit how much you have to: 0-1 drink a day for women who are not pregnant. 0-2 drinks a day for men. Know how much alcohol is in a drink. In the [place] one drink equals one 12 oz bottle of beer (355 mL), one 5 oz glass of wine (148 mL), or one 1 oz glass of hard liquor (44 mL). Reading food labels Check food labels for: Trans fats. Partially hydrogenated oils. Saturated fat (g) in each serving. Cholesterol (mg) in each serving. Fiber (g) in each serving. Choose foods with healthy fats, such as: Monounsaturated fats and polyunsaturated fats. These include olive and canola oil, flaxseeds, walnuts, almonds, and seeds. Omega-3 fats. These are found in certain fish, flaxseed oil, and ground flaxseeds. Choose grain products that have whole grains. Look for the word "whole" as the first word in the ingredient list. Cooking Cook foods using low-fat methods. These include baking, boiling, grilling, and broiling. Eat more home-cooked foods. Eat at restaurants and buffets less often. Eat less fast food. Avoid cooking using saturated fats, such as butter, cream, palm oil, palm kernel oil, and  coconut oil. Meal planning  At meals, divide your plate into four equal parts: Fill one-half of your plate with vegetables, green salads, and fruit. Fill one-fourth of your plate with whole grains. Fill one-fourth of your plate with low-fat (lean) protein foods. Eat fish that is high in omega-3 fats at least two times a week. This includes mackerel, tuna, sardines, and salmon. Eat foods that are high in fiber, such as whole grains, beans, apples, pears, berries, broccoli, carrots, peas, and barley. What foods should I eat? Fruits All fresh, canned (in natural juice), or frozen fruits. Vegetables Fresh or frozen vegetables (raw, steamed, roasted, or grilled). Green salads. Grains Whole grains, such as whole wheat or whole grain breads, crackers, cereals, and pasta. Unsweetened oatmeal, bulgur, barley, quinoa, or brown rice. Corn or whole wheat flour tortillas. Meats and other protein foods Ground beef (85% or leaner), grass-fed beef, or beef trimmed of fat. Skinless chicken or turkey. Ground chicken or turkey. Pork trimmed of fat. All fish and seafood. Egg whites. Dried beans, peas, or lentils. Unsalted nuts or seeds. Unsalted canned beans. Nut butters without added sugar or oil. Dairy Low-fat or nonfat dairy products, such as skim or 1% milk, 2% or reduced-fat cheeses, low-fat and fat-free ricotta or cottage cheese, or plain low-fat and nonfat yogurt. Fats and oils Tub margarine without trans fats. Light or reduced-fat mayonnaise and salad dressings. Avocado. Olive, canola, sesame, or safflower oils. The items listed above may not be a complete list of foods and beverages you can eat. Contact a dietitian for more information. What foods   should I avoid? Fruits Canned fruit in heavy syrup. Fruit in cream or butter sauce. Fried fruit. Vegetables Vegetables cooked in cheese, cream, or butter sauce. Fried vegetables. Grains White bread. White pasta. White rice. Cornbread. Bagels, pastries,  and croissants. Crackers and snack foods that contain trans fat and hydrogenated oils. Meats and other protein foods Fatty cuts of meat. Ribs, chicken wings, bacon, sausage, bologna, salami, chitterlings, fatback, hot dogs, bratwurst, and packaged lunch meats. Liver and organ meats. Whole eggs and egg yolks. Chicken and turkey with skin. Fried meat. Dairy Whole or 2% milk, cream, half-and-half, and cream cheese. Whole milk cheeses. Whole-fat or sweetened yogurt. Full-fat cheeses. Nondairy creamers and whipped toppings. Processed cheese, cheese spreads, and cheese curds. Fats and oils Butter, stick margarine, lard, shortening, ghee, or bacon fat. Coconut, palm kernel, and palm oils. Beverages Alcohol. Sugar-sweetened drinks such as sodas, lemonade, and fruit drinks. Sweets and desserts Corn syrup, sugars, honey, and molasses. Candy. Jam and jelly. Syrup. Sweetened cereals. Cookies, pies, cakes, donuts, muffins, and ice cream. The items listed above may not be a complete list of foods and beverages you should avoid. Contact a dietitian for more information. Summary Choosing the right foods helps keep your fat and cholesterol at normal levels. This can keep you from getting certain diseases. At meals, fill one-half of your plate with vegetables, green salads, and fruits. Eat high fiber foods, like whole grains, beans, apples, pears, berries, carrots, peas, and barley. Limit added sugar, saturated fats, alcohol, and fried foods. This information is not intended to replace advice given to you by your health care provider. Make sure you discuss any questions you have with your health care provider. Document Revised: 02/19/2021 Document Reviewed: 02/19/2021 Elsevier Patient Education  2022 [employer] Inc.  

## 2021-10-08 ENCOUNTER — Other Ambulatory Visit: Payer: Self-pay

## 2021-10-11 ENCOUNTER — Other Ambulatory Visit: Payer: Self-pay

## 2021-10-11 DIAGNOSIS — Z125 Encounter for screening for malignant neoplasm of prostate: Secondary | ICD-10-CM

## 2021-10-11 DIAGNOSIS — E039 Hypothyroidism, unspecified: Secondary | ICD-10-CM

## 2021-10-11 DIAGNOSIS — R7989 Other specified abnormal findings of blood chemistry: Secondary | ICD-10-CM

## 2021-10-11 DIAGNOSIS — Z0000 Encounter for general adult medical examination without abnormal findings: Secondary | ICD-10-CM

## 2021-10-13 ENCOUNTER — Telehealth: Payer: Self-pay | Admitting: Nurse Practitioner

## 2021-10-13 NOTE — Telephone Encounter (Signed)
Patient was seen on 10/07/21 and discussed getting his allergy shots here, but was requested to find out what kind of allergy shots and what was in the allergy shots.  Patient stopped by today to give you this information.  The allergy shots consist of Triamcinolone 10mg  and Dexamethasone 0.5mg  IM.  Please advise patient if he is able to get this allergy shot here.  Please call him on his home#, [phone].

## 2021-10-15 ENCOUNTER — Other Ambulatory Visit: Payer: Self-pay

## 2021-10-15 ENCOUNTER — Other Ambulatory Visit: Payer: Medicare Other

## 2021-10-15 DIAGNOSIS — Z0000 Encounter for general adult medical examination without abnormal findings: Secondary | ICD-10-CM

## 2021-10-15 DIAGNOSIS — E039 Hypothyroidism, unspecified: Secondary | ICD-10-CM

## 2021-10-15 DIAGNOSIS — R7989 Other specified abnormal findings of blood chemistry: Secondary | ICD-10-CM

## 2021-10-16 LAB — COMPREHENSIVE METABOLIC PANEL
ALT: 52 IU/L — ABNORMAL HIGH (ref 0–44)
AST: 28 IU/L (ref 0–40)
Albumin/Globulin Ratio: 2 (ref 1.2–2.2)
Albumin: 4.3 g/dL (ref 3.8–4.8)
Alkaline Phosphatase: 89 IU/L (ref 44–121)
BUN/Creatinine Ratio: 10 (ref 10–24)
BUN: 11 mg/dL (ref 8–27)
Bilirubin Total: 0.5 mg/dL (ref 0.0–1.2)
CO2: 26 mmol/L (ref 20–29)
Calcium: 9.5 mg/dL (ref 8.6–10.2)
Chloride: 101 mmol/L (ref 96–106)
Creatinine, Ser: 1.11 mg/dL (ref 0.76–1.27)
Globulin, Total: 2.2 g/dL (ref 1.5–4.5)
Glucose: 99 mg/dL (ref 70–99)
Potassium: 4.4 mmol/L (ref 3.5–5.2)
Sodium: 141 mmol/L (ref 134–144)
Total Protein: 6.5 g/dL (ref 6.0–8.5)
eGFR: 74 mL/min/{1.73_m2} (ref >59)

## 2021-10-16 LAB — LIPID PANEL
Chol/HDL Ratio: 3.6 ratio (ref 0.0–5.0)
Cholesterol, Total: 144 mg/dL (ref 100–199)
HDL: 40 mg/dL (ref >39)
LDL Chol Calc (NIH): 68 mg/dL (ref 0–99)
Triglycerides: 220 mg/dL — ABNORMAL HIGH (ref 0–149)
VLDL Cholesterol Cal: 36 mg/dL (ref 5–40)

## 2021-10-16 LAB — TSH: TSH: 2.36 u[IU]/mL (ref 0.450–4.500)

## 2021-10-16 LAB — TESTOSTERONE: Testosterone: 454 ng/dL (ref 264–916)

## 2021-10-18 DIAGNOSIS — Z6836 Body mass index (BMI) 36.0-36.9, adult: Secondary | ICD-10-CM | POA: Insufficient documentation

## 2021-10-18 DIAGNOSIS — Z6834 Body mass index (BMI) 34.0-34.9, adult: Secondary | ICD-10-CM | POA: Insufficient documentation

## 2021-10-18 DIAGNOSIS — R7989 Other specified abnormal findings of blood chemistry: Secondary | ICD-10-CM | POA: Insufficient documentation

## 2021-10-18 DIAGNOSIS — F5101 Primary insomnia: Secondary | ICD-10-CM | POA: Insufficient documentation

## 2021-10-18 DIAGNOSIS — I10 Essential (primary) hypertension: Secondary | ICD-10-CM | POA: Insufficient documentation

## 2021-10-20 ENCOUNTER — Telehealth: Payer: Self-pay | Admitting: Nurse Practitioner

## 2021-10-20 NOTE — Telephone Encounter (Signed)
Patient is calling office upset because he came into office on 10/13/21 asking if we were able to take over management of his allergy injections he has previously received from PCP.  This message was routed to Katrina who then routed it to Vincent Gros, DNP on 10/13/2021. The message has not been responded to.     Sent a secure chat to [name] advising the above and requesting her to address the message for patient. AS, CMA

## 2021-10-20 NOTE — Telephone Encounter (Signed)
° °  Patient has been advised of the above and states he isn't interested in a referral, but wants his PCP to manage this injection and if we aren't willing to do this he is going to find a different provider. AS, CMA

## 2021-10-27 NOTE — Progress Notes (Signed)
Normal testosterone level. Triglycerides mildly elevated. Diet changes and exercise recommended.

## 2021-11-01 ENCOUNTER — Other Ambulatory Visit: Payer: Self-pay | Admitting: Physician Assistant

## 2021-11-04 ENCOUNTER — Encounter: Payer: Medicare Other | Admitting: Nurse Practitioner

## 2021-11-15 ENCOUNTER — Ambulatory Visit (INDEPENDENT_AMBULATORY_CARE_PROVIDER_SITE_OTHER): Payer: Medicare Other | Admitting: Nurse Practitioner

## 2021-11-15 ENCOUNTER — Encounter: Payer: Self-pay | Admitting: Nurse Practitioner

## 2021-11-15 ENCOUNTER — Other Ambulatory Visit: Payer: Self-pay

## 2021-11-15 VITALS — BP 114/74 | HR 70 | Temp 98.2°F | Ht 69.0 in | Wt 245.3 lb

## 2021-11-15 DIAGNOSIS — Z0001 Encounter for general adult medical examination with abnormal findings: Secondary | ICD-10-CM | POA: Diagnosis not present

## 2021-11-15 DIAGNOSIS — R7989 Other specified abnormal findings of blood chemistry: Secondary | ICD-10-CM | POA: Diagnosis not present

## 2021-11-15 DIAGNOSIS — M4716 Other spondylosis with myelopathy, lumbar region: Secondary | ICD-10-CM

## 2021-11-15 DIAGNOSIS — Z6836 Body mass index (BMI) 36.0-36.9, adult: Secondary | ICD-10-CM | POA: Diagnosis not present

## 2021-11-15 DIAGNOSIS — R011 Cardiac murmur, unspecified: Secondary | ICD-10-CM | POA: Insufficient documentation

## 2021-11-15 MED ORDER — OXYCODONE HCL 15 MG PO TABS: ORAL_TABLET | ORAL | 0 refills | Status: DC

## 2021-11-15 MED ORDER — TESTOSTERONE CYPIONATE 200 MG/ML IM SOLN: 200.0000 mg | INTRAMUSCULAR | 2 refills | Status: DC

## 2021-11-15 NOTE — Patient Instructions (Signed)
Fat and Cholesterol Restricted Eating Plan Getting too much fat and cholesterol in your diet may cause health problems. Choosing the right foods helps keep your fat and cholesterol at normal levels. This can keep you from getting certain diseases. Your doctor may recommend an eating plan that includes: Total fat: ______% or less of total calories a day. This is ______g of fat a day. Saturated fat: ______% or less of total calories a day. This is ______g of saturated fat a day. Cholesterol: less than _________mg a day. Fiber: ______g a day. What are tips for following this plan? General tips Work with your doctor to lose weight if you need to. Avoid: Foods with added sugar. Fried foods. Foods with trans fat or partially hydrogenated oils. This includes some margarines and baked goods. If you drink alcohol: Limit how much you have to: 0-1 drink a day for women who are not pregnant. 0-2 drinks a day for men. Know how much alcohol is in a drink. In the [place] one drink equals one 12 oz bottle of beer (355 mL), one 5 oz glass of wine (148 mL), or one 1 oz glass of hard liquor (44 mL). Reading food labels Check food labels for: Trans fats. Partially hydrogenated oils. Saturated fat (g) in each serving. Cholesterol (mg) in each serving. Fiber (g) in each serving. Choose foods with healthy fats, such as: Monounsaturated fats and polyunsaturated fats. These include olive and canola oil, flaxseeds, walnuts, almonds, and seeds. Omega-3 fats. These are found in certain fish, flaxseed oil, and ground flaxseeds. Choose grain products that have whole grains. Look for the word "whole" as the first word in the ingredient list. Cooking Cook foods using low-fat methods. These include baking, boiling, grilling, and broiling. Eat more home-cooked foods. Eat at restaurants and buffets less often. Eat less fast food. Avoid cooking using saturated fats, such as butter, cream, palm oil, palm kernel oil, and  coconut oil. Meal planning  At meals, divide your plate into four equal parts: Fill one-half of your plate with vegetables, green salads, and fruit. Fill one-fourth of your plate with whole grains. Fill one-fourth of your plate with low-fat (lean) protein foods. Eat fish that is high in omega-3 fats at least two times a week. This includes mackerel, tuna, sardines, and salmon. Eat foods that are high in fiber, such as whole grains, beans, apples, pears, berries, broccoli, carrots, peas, and barley. What foods should I eat? Fruits All fresh, canned (in natural juice), or frozen fruits. Vegetables Fresh or frozen vegetables (raw, steamed, roasted, or grilled). Green salads. Grains Whole grains, such as whole wheat or whole grain breads, crackers, cereals, and pasta. Unsweetened oatmeal, bulgur, barley, quinoa, or brown rice. Corn or whole wheat flour tortillas. Meats and other protein foods Ground beef (85% or leaner), grass-fed beef, or beef trimmed of fat. Skinless chicken or turkey. Ground chicken or turkey. Pork trimmed of fat. All fish and seafood. Egg whites. Dried beans, peas, or lentils. Unsalted nuts or seeds. Unsalted canned beans. Nut butters without added sugar or oil. Dairy Low-fat or nonfat dairy products, such as skim or 1% milk, 2% or reduced-fat cheeses, low-fat and fat-free ricotta or cottage cheese, or plain low-fat and nonfat yogurt. Fats and oils Tub margarine without trans fats. Light or reduced-fat mayonnaise and salad dressings. Avocado. Olive, canola, sesame, or safflower oils. The items listed above may not be a complete list of foods and beverages you can eat. Contact a dietitian for more information. What foods   should I avoid? Fruits Canned fruit in heavy syrup. Fruit in cream or butter sauce. Fried fruit. Vegetables Vegetables cooked in cheese, cream, or butter sauce. Fried vegetables. Grains White bread. White pasta. White rice. Cornbread. Bagels, pastries,  and croissants. Crackers and snack foods that contain trans fat and hydrogenated oils. Meats and other protein foods Fatty cuts of meat. Ribs, chicken wings, bacon, sausage, bologna, salami, chitterlings, fatback, hot dogs, bratwurst, and packaged lunch meats. Liver and organ meats. Whole eggs and egg yolks. Chicken and turkey with skin. Fried meat. Dairy Whole or 2% milk, cream, half-and-half, and cream cheese. Whole milk cheeses. Whole-fat or sweetened yogurt. Full-fat cheeses. Nondairy creamers and whipped toppings. Processed cheese, cheese spreads, and cheese curds. Fats and oils Butter, stick margarine, lard, shortening, ghee, or bacon fat. Coconut, palm kernel, and palm oils. Beverages Alcohol. Sugar-sweetened drinks such as sodas, lemonade, and fruit drinks. Sweets and desserts Corn syrup, sugars, honey, and molasses. Candy. Jam and jelly. Syrup. Sweetened cereals. Cookies, pies, cakes, donuts, muffins, and ice cream. The items listed above may not be a complete list of foods and beverages you should avoid. Contact a dietitian for more information. Summary Choosing the right foods helps keep your fat and cholesterol at normal levels. This can keep you from getting certain diseases. At meals, fill one-half of your plate with vegetables, green salads, and fruits. Eat high fiber foods, like whole grains, beans, apples, pears, berries, carrots, peas, and barley. Limit added sugar, saturated fats, alcohol, and fried foods. This information is not intended to replace advice given to you by your health care provider. Make sure you discuss any questions you have with your health care provider. Document Revised: 02/19/2021 Document Reviewed: 02/19/2021 Elsevier Patient Education  2022 [employer] Inc.  

## 2021-11-15 NOTE — Progress Notes (Signed)
Established Patient Office Visit  Subjective:  Patient ID: Ronald Parsons, male    DOB: Feb 06, 1956  Age: 66 y.o. MRN: [id number]  CC:  Chief Complaint  Patient presents with   Annual Exam    HPI Ronald Parsons presents for annual wellness visit. He did have routine, fasting labs done prior to this visit. Triglycerides level was mildly elevated with the rest of the lipid panel being normal.   Lipid Panel     Component Value Date/Time   CHOL 144 10/15/2021 0923   TRIG 220 (H) 10/15/2021 0923   HDL 40 10/15/2021 0923   CHOLHDL 3.6 10/15/2021 0923   LDLCALC 68 10/15/2021 0923   LABVLDL 36 10/15/2021 0923    His ALT was also elevated.  Testosterone on low/normal level. Does take testosterone injections. Currently, the prescription he is getting is every 4 weeks, however, what is on file is for every three weeks. Will make sure updated prescription gets to pharmacy for next fill.  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 This is causing overgrowth of many of his bones. Ribs on one side of the body are about double the size as the other. This does cause him quite a bit of pain. He does have moderate to severe bony pain from this. He currently has prescription for oxycodone 44m tablets. He cuts these in half and takes only when needed. He also takes celebrex. He takes this twice daily. He asks, if possible, to get a new prescription for the oxycodone. Will not have follow up for another three months.   Had echocardiogram done 04/2020 which showed the following: 1. Normal LV systolic function with visual EF 55-60%. Left ventricle cavity is normal in size. Moderate left ventricular hypertrophy. Regional wall motion abnormalities with hypokinesis of mid to distal inferoseptal segments. Doppler evidence of grade I (impaired) diastolic dysfunction, normal LAP. 2. Mild aortic valve stenosis (peak velocity 2.552m, MG 1439m, DI 0.3). 3. Mild (Grade I)  mitral regurgitation. 4. Mild tricuspid regurgitation. 5. No prior study for comparison.  No follow up was made with cardiology.  The patient has no new concerns or complaints today.   Past Medical History:  Diagnosis Date   Asthma    allergy related   Cervical radiculitis    Gout    Hyperlipidemia    Hypertension    Lumbago    Osteoarthritis     Past Surgical History:  Procedure Laterality Date   BACK SURGERY  1991   laminectomy   CARDIAC CATHETERIZATION     5-7 years ago, states it was clear   COLONOSCOPY     HERNIA REPAIR     inguinal hernia repair as a baby on right groin   RIGHT/LEFT HEART CATH AND CORONARY ANGIOGRAPHY N/A 06/09/2020   Procedure: RIGHT/LEFT HEART CATH AND CORONARY ANGIOGRAPHY;  Surgeon: GanAdrian ProwsD;  Location: [facility];  Service: Cardiovascular;  Laterality: N/A;    Family History  Problem Relation Age of Onset   Heart disease Father    Hypertension Father    Breast cancer Sister    Valvular heart disease Maternal Grandfather    Emphysema Paternal Grandfather     Social History   Socioeconomic History   Marital status: Married    Spouse name: Not on file   Number of children: Not on file   Years of education: Not on file   Highest education level: Not on file  Occupational History   Not on  file  Tobacco Use   Smoking status: Former   Smokeless tobacco: Current    Types: Chew  Vaping Use   Vaping Use: Never used  Substance and Sexual Activity   Alcohol use: Yes    Comment: weekly   Drug use: No   Sexual activity: Not Currently  Other Topics Concern   Not on file  Social History Narrative   Not on file   Social Determinants of Health   Financial Resource Strain: Not on file  Food Insecurity: Not on file  Transportation Needs: Not on file  Physical Activity: Not on file  Stress: Not on file  Social Connections: Not on file  Intimate Partner Violence: Not on file    Outpatient Medications Prior to Visit   Medication Sig Dispense Refill   aspirin EC 81 MG tablet Take 1 tablet (81 mg total) by mouth daily. Swallow whole. 90 tablet 3   bisoprolol-hydrochlorothiazide (ZIAC) 2.5-6.25 MG tablet Take 0.5-1 tablets by mouth daily. 30 tablet 3   celecoxib (CELEBREX) 200 MG capsule Take 200 mg by mouth daily as needed for mild pain or moderate pain.      cetirizine-pseudoephedrine (ZYRTEC-D) 5-120 MG per tablet Take 1 tablet by mouth 2 (two) times daily as needed for allergies.     cyclobenzaprine (FLEXERIL) 10 MG tablet Take 10 mg by mouth 3 (three) times daily as needed for muscle spasms.     loratadine-pseudoephedrine (CLARITIN-D 12-HOUR) 5-120 MG tablet Take 1 tablet by mouth 2 (two) times daily.     NON FORMULARY balance of nature     tamsulosin (FLOMAX) 0.4 MG CAPS capsule TAKE 1 CAPSULE BY MOUTH DAILY 30 capsule 1   zolpidem (AMBIEN) 10 MG tablet Take 1 tablet (10 mg total) by mouth at bedtime. 30 tablet 3   oxyCODONE (ROXICODONE) 15 MG immediate release tablet Take 1/2 tablet po qD prn 15 tablet 0   testosterone cypionate (DEPOTESTOTERONE CYPIONATE) 200 MG/ML injection Inject 200 mg into the muscle every 21 ( twenty-one) days.      losartan (COZAAR) 25 MG tablet Take 1 tablet (25 mg total) by mouth every evening. (Patient not taking: Reported on 05/27/2020) 90 tablet 3   nitroGLYCERIN (NITROSTAT) 0.4 MG SL tablet Place 1 tablet (0.4 mg total) under the tongue every 5 (five) minutes as needed for chest pain. If you require more than two tablets five minutes apart go to the nearest ER via EMS. 30 tablet 0   No facility-administered medications prior to visit.    Allergies  Allergen Reactions   Hydromorphone Hcl Other (See Comments)    Unruly   Penicillins Hives    As a child   Lovastatin Other (See Comments)    Myalgia    ROS Review of Systems  Constitutional:  Positive for fatigue. Negative for activity change, chills and fever.       Weight gain of four pounds since his most recent visit    HENT:  Negative for congestion, postnasal drip, rhinorrhea, sinus pressure, sinus pain, sneezing and sore throat.   Eyes: Negative.   Respiratory:  Negative for cough, shortness of breath and wheezing.   Cardiovascular:  Negative for chest pain and palpitations.  Gastrointestinal:  Negative for constipation, diarrhea, nausea and vomiting.  Endocrine: Negative for cold intolerance, heat intolerance, polydipsia and polyuria.  Genitourinary:  Negative for dysuria, frequency and urgency.  Musculoskeletal:  Positive for arthralgias, back pain, joint swelling and myalgias.  Skin:  Negative for rash.  Allergic/Immunologic: Negative for  environmental allergies.  Neurological:  Negative for dizziness, weakness and headaches.  Psychiatric/Behavioral:  The patient is not nervous/anxious.      Objective:    Physical Exam Vitals and nursing note reviewed.  Constitutional:      Appearance: Normal appearance. He is well-developed. He is obese.  HENT:     Head: Normocephalic and atraumatic.     Right Ear: Tympanic membrane, ear canal and external ear normal.     Left Ear: Tympanic membrane, ear canal and external ear normal.     Nose: Nose normal.     Mouth/Throat:     Mouth: Mucous membranes are moist.     Pharynx: Oropharynx is clear.  Eyes:     Extraocular Movements: Extraocular movements intact.     Conjunctiva/sclera: Conjunctivae normal.     Pupils: Pupils are equal, round, and reactive to light.  Neck:     Vascular: No carotid bruit.  Cardiovascular:     Rate and Rhythm: Normal rate and regular rhythm.     Pulses: Normal pulses.     Heart sounds: Murmur heard.     Comments: The patient has a moderate, blowing, systolic murmur auscultated over the right chest.  Intermittent premature beats also heard.. Pulmonary:     Effort: Pulmonary effort is normal.     Breath sounds: Normal breath sounds.  Abdominal:     General: Bowel sounds are normal. There is no distension.     Palpations:  Abdomen is soft. There is no mass.     Tenderness: There is no abdominal tenderness. There is no rebound.     Hernia: No hernia is present.  Musculoskeletal:        General: Normal range of motion.     Cervical back: Normal range of motion and neck supple.  Lymphadenopathy:     Cervical: No cervical adenopathy.  Skin:    General: Skin is warm and dry.     Capillary Refill: Capillary refill takes less than 2 seconds.  Neurological:     General: No focal deficit present.     Mental Status: He is alert and oriented to person, place, and time.  Psychiatric:        Mood and Affect: Mood normal.        Behavior: Behavior normal.        Thought Content: Thought content normal.        Judgment: Judgment normal.    Today's Vitals   11/15/21 1018  BP: 114/74  Pulse: 70  Temp: 98.2 F (36.8 C)  SpO2: 95%  Weight: 245 lb 4.8 oz (111.3 kg)  Height: 5' 9"  (1.753 m)   Body mass index is 36.22 kg/m.   Wt Readings from Last 3 Encounters:  11/15/21 245 lb 4.8 oz (111.3 kg)  10/07/21 241 lb 1.9 oz (109.4 kg)  06/09/20 215 lb (97.5 kg)     Health Maintenance Due  Topic Date Due   Zoster Vaccines- Shingrix (1 of 2) Never done   INFLUENZA VACCINE  05/24/2021   Pneumonia Vaccine 42+ Years old (1 - PCV) Never done    There are no preventive care reminders to display for this patient.  Lab Results  Component Value Date   TSH 2.360 10/15/2021   Lab Results  Component Value Date   WBC 8.5 06/09/2020   HGB 19.4 (H) 06/09/2020   HCT 57.0 (H) 06/09/2020   MCV 95.8 06/09/2020   PLT 188 06/09/2020   Lab Results  Component Value Date  NA 141 10/15/2021   K 4.4 10/15/2021   CO2 26 10/15/2021   GLUCOSE 99 10/15/2021   BUN 11 10/15/2021   CREATININE 1.11 10/15/2021   BILITOT 0.5 10/15/2021   ALKPHOS 89 10/15/2021   AST 28 10/15/2021   ALT 52 (H) 10/15/2021   PROT 6.5 10/15/2021   ALBUMIN 4.3 10/15/2021   CALCIUM 9.5 10/15/2021   ANIONGAP 10 06/09/2020   EGFR 74 10/15/2021    Lab Results  Component Value Date   CHOL 144 10/15/2021   Lab Results  Component Value Date   HDL 40 10/15/2021   Lab Results  Component Value Date   LDLCALC 68 10/15/2021   Lab Results  Component Value Date   TRIG 220 (H) 10/15/2021   Lab Results  Component Value Date   CHOLHDL 3.6 10/15/2021      Assessment & Plan:  1. Encounter for general adult medical examination with abnormal findings Annual wellness visit today   2. Low testosterone in male Patient serum testosterone is low normal.  May continue take 200 mg every 3 weeks.  We will retest serum testosterone along with prolactin prior to next visit in 3 months. - testosterone cypionate (DEPOTESTOSTERONE CYPIONATE) 200 MG/ML injection; Inject 1 mL (200 mg total) into the muscle every 21 ( twenty-one) days.  Dispense: 10 mL; Refill: 2  3. 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  Prescription is for #15 tablets only. - oxyCODONE (ROXICODONE) 15 MG immediate release tablet; Take 1/2 tablet po qD prn  Dispense: 15 tablet; Refill: 0  4. Body mass index (BMI) of 36.0-36.9 in adult Encourage patient to limit calorie intake to 2000 cal/day or less.  He should consume a low cholesterol, low-fat diet.  Patient should incorporate exercise into his daily routine.   5. Murmur, cardiac Moderate, blowing, systolic murmur auscultated on right side of the chest.  Reviewed previous echocardiogram done in 04/2020.  Will order new echocardiogram at next visit for surveillance.  He did have right and left cardiac catheterization 05/2020.  He had normal left ventricular systolic function.  LV and diastolic pressure was normal also.   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    Dispense:  10 mL    Refill:  2    Dosing can be every 21 days - next prescription should be every 21 days.    Order Specific Question:   Supervising Provider    Answer:   MBeatrice LecherD [[id number]]   oxyCODONE (ROXICODONE) 15 MG immediate release tablet    Sig: Take 1/2 tablet po qD prn    Dispense:  15 tablet    Refill:  0  Order Specific Question:   Supervising Provider    Answer:   Beatrice Lecher D [[id number]]    Follow-up: Return in about 3 months (around 02/13/2022) for blood pressure - check testosterone, prolactin, and CMP a week prior to next visit .    Ronnell Freshwater, NP  This note was dictated using Systems analyst. Rapid proofreading was performed to expedite the delivery of the information. Despite proofreading, phonetic errors will occur which are common with this voice recognition software. Please take this into consideration. If there are any concerns, please contact our office.

## 2021-11-18 ENCOUNTER — Telehealth: Payer: Self-pay | Admitting: Nurse Practitioner

## 2021-11-18 ENCOUNTER — Other Ambulatory Visit: Payer: Self-pay | Admitting: Nurse Practitioner

## 2021-11-18 DIAGNOSIS — R7989 Other specified abnormal findings of blood chemistry: Secondary | ICD-10-CM

## 2021-11-18 MED ORDER — TESTOSTERONE CYPIONATE 200 MG/ML IM SOLN: 200.0000 mg | INTRAMUSCULAR | 2 refills | Status: DC

## 2021-11-18 NOTE — Telephone Encounter (Signed)
I sent that new prescription for the 74ml vial to [facility].

## 2021-11-18 NOTE — Telephone Encounter (Signed)
Patient called and stated that the pharmacy he gets his testosterone only has the vials with 10 doses and is requesting you send over a different prescription to cover it. They do not have the single dose vials any longer. Also patient is out of syringes and needs some called in as well. Please advise. [phone]

## 2021-11-18 NOTE — Telephone Encounter (Signed)
Called pt LVM  stating that his Rx was sent to the pharmacy

## 2022-01-10 ENCOUNTER — Other Ambulatory Visit: Payer: Self-pay | Admitting: Nurse Practitioner

## 2022-01-25 ENCOUNTER — Other Ambulatory Visit: Payer: Self-pay | Admitting: Nurse Practitioner

## 2022-01-25 ENCOUNTER — Telehealth: Payer: Self-pay | Admitting: Nurse Practitioner

## 2022-01-25 DIAGNOSIS — M4716 Other spondylosis with myelopathy, lumbar region: Secondary | ICD-10-CM

## 2022-01-25 NOTE — Telephone Encounter (Signed)
Patient states he has not asked the Ortho provider for pain medication. I advised patient that since they are treating him for this issue, any prescriptions for pain medications would need to come from them. AS, CMA ?

## 2022-01-25 NOTE — Telephone Encounter (Signed)
Patient left a vm during lunch requesting a refill of pain medication due to a torn meniscus that he is currently seeing [facility] for. He stated that he will be having surgery on it but for now he needs some "pills". Please advise. ?

## 2022-01-25 NOTE — Telephone Encounter (Signed)
Reviewed PDMP profile. Overdose risk score is 230 with last fill of oxycodone 10/2021. Patient has upcoming appointment for 02/14/2022. Filled #15 tablets and will discuss further at next  ?

## 2022-01-25 NOTE — Telephone Encounter (Signed)
lmtc

## 2022-01-25 NOTE — Telephone Encounter (Signed)
Do you mind asking him if ortho gave him anything for pain? If they did, I cannot do any new prescriptions until he is released from ortho.

## 2022-01-26 ENCOUNTER — Other Ambulatory Visit: Payer: Self-pay

## 2022-01-26 DIAGNOSIS — R7989 Other specified abnormal findings of blood chemistry: Secondary | ICD-10-CM

## 2022-01-26 DIAGNOSIS — Z0000 Encounter for general adult medical examination without abnormal findings: Secondary | ICD-10-CM

## 2022-02-08 ENCOUNTER — Other Ambulatory Visit: Payer: Medicare Other

## 2022-02-08 ENCOUNTER — Other Ambulatory Visit: Payer: No Typology Code available for payment source

## 2022-02-08 ENCOUNTER — Other Ambulatory Visit: Payer: Self-pay | Admitting: Nurse Practitioner

## 2022-02-08 DIAGNOSIS — R7989 Other specified abnormal findings of blood chemistry: Secondary | ICD-10-CM

## 2022-02-08 DIAGNOSIS — F5101 Primary insomnia: Secondary | ICD-10-CM

## 2022-02-08 DIAGNOSIS — Z0000 Encounter for general adult medical examination without abnormal findings: Secondary | ICD-10-CM

## 2022-02-09 LAB — COMPREHENSIVE METABOLIC PANEL
ALT: 30 IU/L (ref 0–44)
AST: 27 IU/L (ref 0–40)
Albumin/Globulin Ratio: 1.8 (ref 1.2–2.2)
Albumin: 4.2 g/dL (ref 3.8–4.8)
Alkaline Phosphatase: 64 IU/L (ref 44–121)
BUN/Creatinine Ratio: 17 (ref 10–24)
BUN: 17 mg/dL (ref 8–27)
Bilirubin Total: 0.4 mg/dL (ref 0.0–1.2)
CO2: 24 mmol/L (ref 20–29)
Calcium: 9.3 mg/dL (ref 8.6–10.2)
Chloride: 100 mmol/L (ref 96–106)
Creatinine, Ser: 1 mg/dL (ref 0.76–1.27)
Globulin, Total: 2.3 g/dL (ref 1.5–4.5)
Glucose: 92 mg/dL (ref 70–99)
Potassium: 3.6 mmol/L (ref 3.5–5.2)
Sodium: 141 mmol/L (ref 134–144)
Total Protein: 6.5 g/dL (ref 6.0–8.5)
eGFR: 84 mL/min/{1.73_m2} (ref >59)

## 2022-02-09 LAB — TESTOSTERONE: Testosterone: 1156 ng/dL — ABNORMAL HIGH (ref 264–916)

## 2022-02-09 LAB — PROLACTIN: Prolactin: 24.2 ng/mL — ABNORMAL HIGH (ref 4.0–15.2)

## 2022-02-09 NOTE — Telephone Encounter (Signed)
Will discuss with him further at visit 02/14/2022 ? ?

## 2022-02-09 NOTE — Progress Notes (Signed)
Elevated prolactin. Will discuss with patient and consider referral to endocrinology at next visit. 02/14/2022.

## 2022-02-09 NOTE — Telephone Encounter (Signed)
Patient has follow up 02/14/2022 ?

## 2022-02-13 NOTE — Progress Notes (Signed)
Established patient visit ? ? ?Patient: Ronald Parsons   DOB: Oct 01, 1956   66 y.o. Male  MRN: [id number] ?Visit Date: 02/14/2022 ? ? ?Chief Complaint  ?Patient presents with  ? Hypertension  ? ?Subjective  ?  ?HPI  ?Patient presents for routine follow up ?-recent check of labs showed elevated testosterone and prolactin levels. Discussed decreasing dose testosterone. Will cut to 150mg  for next few doses and will recheck testosterone and prolactin in three months.  ?-normal CMP ?-had surgery on the right knee. Surgery was 02/08/2022. Had to repair damaged tendon. Had some significant pain in right hip after surgery. Is supposed to start physical therapy this week. Follows up with surgeon in beginning of next month.  ?-Patient has had some changes in taste. Mostly tastes "burnt garlic."   ?-Was treated for sinus infection just prior to his knee surgery. States that taste has improved.  ? ?Medications: ?Outpatient Medications Prior to Visit  ?Medication Sig  ? aspirin EC 81 MG tablet Take 1 tablet (81 mg total) by mouth daily. Swallow whole.  ? bisoprolol-hydrochlorothiazide (ZIAC) 2.5-6.25 MG tablet Take 0.5-1 tablets by mouth daily.  ? celecoxib (CELEBREX) 200 MG capsule Take 200 mg by mouth daily as needed for mild pain or moderate pain.   ? cetirizine-pseudoephedrine (ZYRTEC-D) 5-120 MG per tablet Take 1 tablet by mouth 2 (two) times daily as needed for allergies.  ? cyclobenzaprine (FLEXERIL) 10 MG tablet Take 10 mg by mouth 3 (three) times daily as needed for muscle spasms.  ? loratadine-pseudoephedrine (CLARITIN-D 12-HOUR) 5-120 MG tablet Take 1 tablet by mouth 2 (two) times daily.  ? NON FORMULARY balance of nature  ? oxyCODONE (ROXICODONE) 15 MG immediate release tablet TAKE 1/2 TABLET BY MOUTH ONCE DAILY AS NEEDED  ? tamsulosin (FLOMAX) 0.4 MG CAPS capsule TAKE 1 CAPSULE BY MOUTH DAILY  ? zolpidem (AMBIEN) 10 MG tablet TAKE 1 TABLET BY MOUTH AT BEDTIME.  ? [DISCONTINUED] testosterone cypionate (DEPOTESTOSTERONE  CYPIONATE) 200 MG/ML injection Inject 1 mL (200 mg total) into the muscle every 21 ( twenty-one) days.  ? nitroGLYCERIN (NITROSTAT) 0.4 MG SL tablet Place 1 tablet (0.4 mg total) under the tongue every 5 (five) minutes as needed for chest pain. If you require more than two tablets five minutes apart go to the nearest ER via EMS.  ? [DISCONTINUED] losartan (COZAAR) 25 MG tablet Take 1 tablet (25 mg total) by mouth every evening. (Patient not taking: Reported on 05/27/2020)  ? ?No facility-administered medications prior to visit.  ? ? ?Review of Systems  ?Constitutional:  Negative for activity change, chills, fatigue and fever.  ?HENT:  Positive for congestion. Negative for postnasal drip, rhinorrhea, sinus pressure, sinus pain, sneezing and sore throat.   ?     Tastes bad taste in his mouth. Like burnt garlic.   ?Eyes: Negative.   ?Respiratory:  Negative for cough, shortness of breath and wheezing.   ?Cardiovascular:  Negative for chest pain and palpitations.  ?Gastrointestinal:  Negative for constipation, diarrhea, nausea and vomiting.  ?Endocrine: Negative for cold intolerance, heat intolerance, polydipsia and polyuria.  ?     Elevated testosterone and prolactin levels on recent labs   ?Genitourinary:  Negative for dysuria, frequency and urgency.  ?Musculoskeletal:  Negative for back pain and myalgias.  ?Skin:  Negative for rash.  ?Allergic/Immunologic: Positive for environmental allergies.  ?Neurological:  Negative for dizziness, weakness and headaches.  ?Psychiatric/Behavioral:  The patient is not nervous/anxious.   ? ? ? ? Objective  ?  ? ?  Today's Vitals  ? 02/14/22 1046  ?BP: 113/73  ?Pulse: 89  ?Temp: 98 ?F (36.7 ?C)  ?SpO2: 92%  ?Weight: 234 lb 6.4 oz (106.3 kg)  ?Height: 5' 8.9" (1.75 m)  ? ?Body mass index is 34.72 kg/m?.  ? ?BP Readings from Last 3 Encounters:  ?02/14/22 113/73  ?11/15/21 114/74  ?10/07/21 137/88  ?  ?Wt Readings from Last 3 Encounters:  ?02/14/22 234 lb 6.4 oz (106.3 kg)  ?11/15/21 245 lb  4.8 oz (111.3 kg)  ?10/07/21 241 lb 1.9 oz (109.4 kg)  ?  ?Physical Exam ?Vitals and nursing note reviewed.  ?Constitutional:   ?   Appearance: Normal appearance. He is well-developed.  ?HENT:  ?   Head: Normocephalic and atraumatic.  ?Eyes:  ?   Pupils: Pupils are equal, round, and reactive to light.  ?Cardiovascular:  ?   Rate and Rhythm: Normal rate and regular rhythm.  ?   Pulses: Normal pulses.  ?   Heart sounds: Normal heart sounds.  ?Pulmonary:  ?   Effort: Pulmonary effort is normal.  ?   Breath sounds: Normal breath sounds.  ?Abdominal:  ?   Palpations: Abdomen is soft.  ?Musculoskeletal:     ?   General: Normal range of motion.  ?   Cervical back: Normal range of motion and neck supple.  ?   Comments: Right knee is currently in neoprene sleeve. Some bruising and swelling present under sleeve.   ?Lymphadenopathy:  ?   Cervical: No cervical adenopathy.  ?Skin: ?   General: Skin is warm and dry.  ?   Capillary Refill: Capillary refill takes less than 2 seconds.  ?Neurological:  ?   General: No focal deficit present.  ?   Mental Status: He is alert and oriented to person, place, and time.  ?Psychiatric:     ?   Mood and Affect: Mood normal.     ?   Behavior: Behavior normal.     ?   Thought Content: Thought content normal.     ?   Judgment: Judgment normal.  ?  ? ? Assessment & Plan  ?  ?1. Essential hypertension ?Well managed. Continue blood pressure medication as prescribed  ? ?2. Low testosterone in male ?Testosterone on high side with recent check. Decrease dose to 150mg  every 21 days. Recheck level in 3 months. Adjust dosing as indicated  ?- testosterone cypionate (DEPOTESTOSTERONE CYPIONATE) 200 MG/ML injection; Inject 0.75 mLs (150 mg total) into the muscle every 21 ( twenty-one) days.  Dispense: 10 mL; Refill: 2 ? ?3. Elevated prolactin level ?Lower dose of testosterone to 150mg  every 21 days. Recheck in three months and refer to endocrinology as indicated.  ? ?4. Body mass index (BMI) of 34.0-34.9 in  adult ?Encourage patient to limit calorie intake to 2000 cal/day or less.  He should consume a low cholesterol, low-fat diet.  Advised he gradually incorporate exercise into daily routine once healed from recent knee surgery.  ? ?Problem List Items Addressed This Visit   ? ?  ? Cardiovascular and Mediastinum  ? Essential hypertension - Primary  ?  ? Other  ? Low testosterone in male  ? Relevant Medications  ? testosterone cypionate (DEPOTESTOSTERONE CYPIONATE) 200 MG/ML injection  ? Body mass index (BMI) of 34.0-34.9 in adult  ? Elevated prolactin level  ?  ? ?Return in about 3 months (around 05/16/2022) for blood pressure, testosterone - check testosterone and prolactin level prior to visit .  ?   ? ? ? ? ?  Ronnell Freshwater, NP  ?[facility] ?[phone] (phone) ?[phone] (fax) ? ?[facility]  ?

## 2022-02-14 ENCOUNTER — Ambulatory Visit (INDEPENDENT_AMBULATORY_CARE_PROVIDER_SITE_OTHER): Payer: No Typology Code available for payment source | Admitting: Nurse Practitioner

## 2022-02-14 ENCOUNTER — Encounter: Payer: Self-pay | Admitting: Nurse Practitioner

## 2022-02-14 VITALS — BP 113/73 | HR 89 | Temp 98.0°F | Ht 68.9 in | Wt 234.4 lb

## 2022-02-14 DIAGNOSIS — Z6834 Body mass index (BMI) 34.0-34.9, adult: Secondary | ICD-10-CM

## 2022-02-14 DIAGNOSIS — I10 Essential (primary) hypertension: Secondary | ICD-10-CM | POA: Diagnosis not present

## 2022-02-14 DIAGNOSIS — R7989 Other specified abnormal findings of blood chemistry: Secondary | ICD-10-CM

## 2022-02-14 MED ORDER — TESTOSTERONE CYPIONATE 200 MG/ML IM SOLN: 150.0000 mg | INTRAMUSCULAR | 2 refills | Status: DC

## 2022-03-01 ENCOUNTER — Encounter: Payer: Self-pay | Admitting: Nurse Practitioner

## 2022-03-01 ENCOUNTER — Ambulatory Visit (INDEPENDENT_AMBULATORY_CARE_PROVIDER_SITE_OTHER): Payer: No Typology Code available for payment source | Admitting: Nurse Practitioner

## 2022-03-01 VITALS — BP 122/73 | HR 76 | Temp 98.3°F | Ht 68.9 in | Wt 247.0 lb

## 2022-03-01 DIAGNOSIS — R300 Dysuria: Secondary | ICD-10-CM

## 2022-03-01 DIAGNOSIS — I10 Essential (primary) hypertension: Secondary | ICD-10-CM | POA: Diagnosis not present

## 2022-03-01 DIAGNOSIS — J0101 Acute recurrent maxillary sinusitis: Secondary | ICD-10-CM

## 2022-03-01 LAB — POCT URINALYSIS DIP (CLINITEK)
Bilirubin, UA: NEGATIVE
Blood, UA: NEGATIVE
Glucose, UA: NEGATIVE mg/dL
Ketones, POC UA: NEGATIVE mg/dL
Leukocytes, UA: NEGATIVE
Nitrite, UA: NEGATIVE
POC PROTEIN,UA: NEGATIVE
Spec Grav, UA: 1.025 (ref 1.010–1.025)
Urobilinogen, UA: 0.2 E.U./dL
pH, UA: 6 (ref 5.0–8.0)

## 2022-03-01 MED ORDER — SULFAMETHOXAZOLE-TRIMETHOPRIM 800-160 MG PO TABS: 1.0000 | ORAL_TABLET | Freq: Two times a day (BID) | ORAL | 0 refills | Status: DC

## 2022-03-01 NOTE — Progress Notes (Signed)
Established patient visit ? ? ?Patient: Ronald Parsons   DOB: 1956-01-20   66 y.o. Male  MRN: [id number] ?Visit Date: 03/01/2022 ? ?Chief Complaint  ?Patient presents with  ? Sinus Problem  ? ?Subjective  ?  ?HPI  ?-Patient has had some changes in taste. Mostly tastes "burnt garlic."   ?-Was treated for sinus infection just prior to his knee surgery. Started on 02/08/2022 and took for six days. States that taste has improved. States that symptoms got about half better. Symptoms started to come back about 3 days after he stopped antibiotics . ?-symptoms have returned. Has right sided cheek pain, right sided nasal congestion, and headache. Has radually become worse over the last few weeks.  ?-has some burning pain with urination. This is intermittent. Does take flomax every day. States that if he tries to come off of this, he has a hard time urinating.  ? ?Medications: ?Outpatient Medications Prior to Visit  ?Medication Sig  ? aspirin EC 81 MG tablet Take 1 tablet (81 mg total) by mouth daily. Swallow whole.  ? bisoprolol-hydrochlorothiazide (ZIAC) 2.5-6.25 MG tablet Take 0.5-1 tablets by mouth daily.  ? celecoxib (CELEBREX) 200 MG capsule Take 200 mg by mouth daily as needed for mild pain or moderate pain.   ? cetirizine-pseudoephedrine (ZYRTEC-D) 5-120 MG per tablet Take 1 tablet by mouth 2 (two) times daily as needed for allergies.  ? cyclobenzaprine (FLEXERIL) 10 MG tablet Take 10 mg by mouth 3 (three) times daily as needed for muscle spasms.  ? loratadine-pseudoephedrine (CLARITIN-D 12-HOUR) 5-120 MG tablet Take 1 tablet by mouth 2 (two) times daily.  ? nitroGLYCERIN (NITROSTAT) 0.4 MG SL tablet Place 1 tablet (0.4 mg total) under the tongue every 5 (five) minutes as needed for chest pain. If you require more than two tablets five minutes apart go to the nearest ER via EMS.  ? NON FORMULARY balance of nature  ? oxyCODONE (ROXICODONE) 15 MG immediate release tablet TAKE 1/2 TABLET BY MOUTH ONCE DAILY AS NEEDED  ?  tamsulosin (FLOMAX) 0.4 MG CAPS capsule TAKE 1 CAPSULE BY MOUTH DAILY  ? testosterone cypionate (DEPOTESTOSTERONE CYPIONATE) 200 MG/ML injection Inject 0.75 mLs (150 mg total) into the muscle every 21 ( twenty-one) days.  ? zolpidem (AMBIEN) 10 MG tablet TAKE 1 TABLET BY MOUTH AT BEDTIME.  ? ?No facility-administered medications prior to visit.  ? ? ?Review of Systems  ?Constitutional:  Positive for fatigue. Negative for activity change, chills and fever.  ?HENT:  Positive for congestion, ear pain, facial swelling, postnasal drip, rhinorrhea, sinus pressure, sinus pain and sore throat. Negative for sneezing.   ?Eyes: Negative.   ?Respiratory:  Negative for cough, shortness of breath and wheezing.   ?Cardiovascular:  Negative for chest pain and palpitations.  ?Gastrointestinal:  Negative for constipation, diarrhea, nausea and vomiting.  ?Endocrine: Negative for cold intolerance, heat intolerance, polydipsia and polyuria.  ?Genitourinary:  Negative for dysuria, frequency and urgency.  ?Musculoskeletal:  Negative for back pain and myalgias.  ?Skin:  Negative for rash.  ?Allergic/Immunologic: Positive for environmental allergies.  ?Neurological:  Positive for headaches. Negative for dizziness and weakness.  ?Hematological:  Positive for adenopathy.  ?Psychiatric/Behavioral:  The patient is not nervous/anxious.   ? ? Objective  ?  ? ?Today's Vitals  ? 03/01/22 1534  ?BP: 122/73  ?Pulse: 76  ?Temp: 98.3 ?F (36.8 ?C)  ?SpO2: 97%  ?Weight: 247 lb (112 kg)  ? ?Body mass index is 36.58 kg/m?.  ? ?Physical Exam ?Vitals and nursing  note reviewed.  ?Constitutional:   ?   Appearance: Normal appearance. He is well-developed. He is ill-appearing.  ?HENT:  ?   Head: Normocephalic and atraumatic.  ?   Right Ear: Tympanic membrane is erythematous and bulging.  ?   Left Ear: Tympanic membrane is erythematous and bulging.  ?   Nose: Congestion present.  ?   Right Sinus: Maxillary sinus tenderness and frontal sinus tenderness present.  ?    Mouth/Throat:  ?   Pharynx: Posterior oropharyngeal erythema present.  ?Eyes:  ?   Pupils: Pupils are equal, round, and reactive to light.  ?Cardiovascular:  ?   Rate and Rhythm: Normal rate and regular rhythm.  ?   Pulses: Normal pulses.  ?   Heart sounds: Normal heart sounds.  ?Pulmonary:  ?   Effort: Pulmonary effort is normal.  ?   Breath sounds: Normal breath sounds. No wheezing or rhonchi.  ?Abdominal:  ?   Palpations: Abdomen is soft.  ?Musculoskeletal:     ?   General: Normal range of motion.  ?   Cervical back: Normal range of motion and neck supple.  ?Lymphadenopathy:  ?   Cervical: Cervical adenopathy present.  ?Skin: ?   General: Skin is warm and dry.  ?   Capillary Refill: Capillary refill takes less than 2 seconds.  ?Neurological:  ?   General: No focal deficit present.  ?   Mental Status: He is alert and oriented to person, place, and time.  ?Psychiatric:     ?   Mood and Affect: Mood normal.     ?   Behavior: Behavior normal.     ?   Thought Content: Thought content normal.     ?   Judgment: Judgment normal.  ?  ? ?Results for orders placed or performed in visit on 03/01/22  ?POCT URINALYSIS DIP (CLINITEK)  ?Result Value Ref Range  ? Color, UA yellow yellow  ? Clarity, UA clear clear  ? Glucose, UA negative negative mg/dL  ? Bilirubin, UA negative negative  ? Ketones, POC UA negative negative mg/dL  ? Spec Grav, UA 1.025 1.010 - 1.025  ? Blood, UA negative negative  ? pH, UA 6.0 5.0 - 8.0  ? POC PROTEIN,UA negative negative, trace  ? Urobilinogen, UA 0.2 0.2 or 1.0 E.U./dL  ? Nitrite, UA Negative Negative  ? Leukocytes, UA Negative Negative  ? ? Assessment & Plan  ?  ? ?1. Acute recurrent maxillary sinusitis ?Start bactrim DS twice daily for 10 days. Rest and increase fluids. Continue using OTC medication to control symptoms.   ?- sulfamethoxazole-trimethoprim (BACTRIM DS) 800-160 MG tablet; Take 1 tablet by mouth 2 (two) times daily.  Dispense: 20 tablet; Refill: 0 ? ?2. Dysuria ?Urine sample  negative for evidence of infection or other abnormalities. ?- POCT URINALYSIS DIP (CLINITEK) ? ?3. Essential hypertension ?Blood pressure stable. ? ?Return for prn worsening or persistent symptoms.  ?   ? ? ? ?Carlean Jews, NP  ?[facility] ?[phone] (phone) [phone] (fax) ? ?[facility] ?

## 2022-03-06 DIAGNOSIS — R300 Dysuria: Secondary | ICD-10-CM | POA: Insufficient documentation

## 2022-03-06 DIAGNOSIS — J0101 Acute recurrent maxillary sinusitis: Secondary | ICD-10-CM | POA: Insufficient documentation

## 2022-03-09 ENCOUNTER — Other Ambulatory Visit: Payer: Self-pay | Admitting: Nurse Practitioner

## 2022-03-09 DIAGNOSIS — I10 Essential (primary) hypertension: Secondary | ICD-10-CM

## 2022-03-14 ENCOUNTER — Other Ambulatory Visit: Payer: Self-pay | Admitting: Nurse Practitioner

## 2022-03-14 DIAGNOSIS — F5101 Primary insomnia: Secondary | ICD-10-CM

## 2022-03-25 ENCOUNTER — Other Ambulatory Visit: Payer: Self-pay | Admitting: Nurse Practitioner

## 2022-03-29 ENCOUNTER — Other Ambulatory Visit: Payer: Self-pay | Admitting: Nurse Practitioner

## 2022-05-02 ENCOUNTER — Other Ambulatory Visit: Payer: Self-pay | Admitting: Physician Assistant

## 2022-05-06 ENCOUNTER — Other Ambulatory Visit: Payer: Self-pay

## 2022-05-06 DIAGNOSIS — R7989 Other specified abnormal findings of blood chemistry: Secondary | ICD-10-CM

## 2022-05-09 ENCOUNTER — Other Ambulatory Visit: Payer: No Typology Code available for payment source

## 2022-05-09 DIAGNOSIS — R7989 Other specified abnormal findings of blood chemistry: Secondary | ICD-10-CM

## 2022-05-10 LAB — TESTOSTERONE: Testosterone: 794 ng/dL (ref 264–916)

## 2022-05-10 LAB — PROLACTIN: Prolactin: 14.8 ng/mL (ref 4.0–15.2)

## 2022-05-16 ENCOUNTER — Ambulatory Visit: Payer: No Typology Code available for payment source | Admitting: Nurse Practitioner

## 2022-05-26 ENCOUNTER — Ambulatory Visit (INDEPENDENT_AMBULATORY_CARE_PROVIDER_SITE_OTHER): Payer: No Typology Code available for payment source | Admitting: Nurse Practitioner

## 2022-05-26 DIAGNOSIS — Z91199 Patient's noncompliance with other medical treatment and regimen due to unspecified reason: Secondary | ICD-10-CM

## 2022-05-26 NOTE — Progress Notes (Deleted)
Established patient visit   Patient: Ronald Parsons   DOB: May 05, 1956   66 y.o. Male  MRN: [id number] Visit Date: 05/26/2022   No chief complaint on file.  Subjective    HPI  Follow up for htn and low testosterone -blood pressure typically well managed.  -decreased testosterone dose at last visit.  --now on 150 mg every 1 days.  --recent check of testosterone and prolactin show that both levels are now normal. Will continue this dose.    Medications: Outpatient Medications Prior to Visit  Medication Sig   aspirin EC 81 MG tablet Take 1 tablet (81 mg total) by mouth daily. Swallow whole.   bisoprolol-hydrochlorothiazide (ZIAC) 2.5-6.25 MG tablet TAKE 1/2 TO 1 TABLET BY MOUTH ONCE DAILY   celecoxib (CELEBREX) 200 MG capsule Take 200 mg by mouth daily as needed for mild pain or moderate pain.    cetirizine-pseudoephedrine (ZYRTEC-D) 5-120 MG per tablet Take 1 tablet by mouth 2 (two) times daily as needed for allergies.   cyclobenzaprine (FLEXERIL) 10 MG tablet Take 10 mg by mouth 3 (three) times daily as needed for muscle spasms.   LORATADINE-D 12HR 5-120 MG tablet TAKE 1 TABLET BY MOUTH TWICE DAILY. NEEDS NEW DOCTOR. DOCTOR RETIRED.   nitroGLYCERIN (NITROSTAT) 0.4 MG SL tablet Place 1 tablet (0.4 mg total) under the tongue every 5 (five) minutes as needed for chest pain. If you require more than two tablets five minutes apart go to the nearest ER via EMS.   NON FORMULARY balance of nature   oxyCODONE (ROXICODONE) 15 MG immediate release tablet TAKE 1/2 TABLET BY MOUTH ONCE DAILY AS NEEDED   sulfamethoxazole-trimethoprim (BACTRIM DS) 800-160 MG tablet Take 1 tablet by mouth 2 (two) times daily.   tamsulosin (FLOMAX) 0.4 MG CAPS capsule TAKE 1 CAPSULE BY MOUTH DAILY   testosterone cypionate (DEPOTESTOSTERONE CYPIONATE) 200 MG/ML injection Inject 0.75 mLs (150 mg total) into the muscle every 21 ( twenty-one) days.   zolpidem (AMBIEN) 10 MG tablet TAKE 1 TABLET BY MOUTH AT BEDTIME.   No  facility-administered medications prior to visit.    Review of Systems  {Labs (Optional):23779}   Objective    There were no vitals taken for this visit. BP Readings from Last 3 Encounters:  03/01/22 122/73  02/14/22 113/73  11/15/21 114/74    Wt Readings from Last 3 Encounters:  03/01/22 247 lb (112 kg)  02/14/22 234 lb 6.4 oz (106.3 kg)  11/15/21 245 lb 4.8 oz (111.3 kg)    Physical Exam  ***  No results found for any visits on 05/26/22.  Assessment & Plan     Problem List Items Addressed This Visit   None    No follow-ups on file.         Carlean Jews, NP  [facility] [phone] (phone) [phone] (fax)  [facility]

## 2022-05-31 ENCOUNTER — Ambulatory Visit (INDEPENDENT_AMBULATORY_CARE_PROVIDER_SITE_OTHER): Payer: No Typology Code available for payment source | Admitting: Nurse Practitioner

## 2022-05-31 ENCOUNTER — Encounter: Payer: Self-pay | Admitting: Nurse Practitioner

## 2022-05-31 VITALS — BP 107/67 | HR 78 | Ht 68.9 in | Wt 247.1 lb

## 2022-05-31 DIAGNOSIS — M4716 Other spondylosis with myelopathy, lumbar region: Secondary | ICD-10-CM | POA: Diagnosis not present

## 2022-05-31 DIAGNOSIS — R062 Wheezing: Secondary | ICD-10-CM | POA: Diagnosis not present

## 2022-05-31 DIAGNOSIS — J0101 Acute recurrent maxillary sinusitis: Secondary | ICD-10-CM | POA: Diagnosis not present

## 2022-05-31 MED ORDER — AZITHROMYCIN 250 MG PO TABS: ORAL_TABLET | ORAL | 0 refills | Status: DC

## 2022-05-31 MED ORDER — TRIAMCINOLONE ACETONIDE 55 MCG/ACT NA AERO: 2.0000 | INHALATION_SPRAY | Freq: Every day | NASAL | 11 refills | Status: DC

## 2022-05-31 MED ORDER — OXYCODONE HCL 15 MG PO TABS: ORAL_TABLET | ORAL | 0 refills | Status: DC

## 2022-05-31 MED ORDER — CELECOXIB 200 MG PO CAPS
200.0000 mg | ORAL_CAPSULE | Freq: Every day | ORAL | 3 refills | Status: DC | PRN
Start: 2022-05-31 — End: 2022-09-19

## 2022-05-31 MED ORDER — METHYLPREDNISOLONE 4 MG PO TBPK: ORAL_TABLET | ORAL | 0 refills | Status: DC

## 2022-05-31 NOTE — Progress Notes (Signed)
Established patient visit   Patient: Ronald Parsons   DOB: 08-04-1956   66 y.o. Male  MRN: [id number] Visit Date: 05/31/2022  Chief Complaint  Patient presents with   Sinus Problem   Subjective    HPI  The patient states that he started having some increased congestion and wheezing fr the past few weeks. Last Thursday, he developed facial pain, headache, and cough. Has had to use his rescue inhaler several times over the past few weeks. He does take allergy medication everyday. Has increased vitamins. He states that he never ran a fever. Did not take any medication other than allergy medication and rescue inhaler. States that last night, he slept better than he had in a few weeks. Has taken two aleve every day for the past several days   Medications: Outpatient Medications Prior to Visit  Medication Sig   aspirin EC 81 MG tablet Take 1 tablet (81 mg total) by mouth daily. Swallow whole.   bisoprolol-hydrochlorothiazide (ZIAC) 2.5-6.25 MG tablet TAKE 1/2 TO 1 TABLET BY MOUTH ONCE DAILY   cetirizine-pseudoephedrine (ZYRTEC-D) 5-120 MG per tablet Take 1 tablet by mouth 2 (two) times daily as needed for allergies.   cyclobenzaprine (FLEXERIL) 10 MG tablet Take 10 mg by mouth 3 (three) times daily as needed for muscle spasms.   NON FORMULARY balance of nature   tamsulosin (FLOMAX) 0.4 MG CAPS capsule TAKE 1 CAPSULE BY MOUTH DAILY   testosterone cypionate (DEPOTESTOSTERONE CYPIONATE) 200 MG/ML injection Inject 0.75 mLs (150 mg total) into the muscle every 21 ( twenty-one) days.   zolpidem (AMBIEN) 10 MG tablet TAKE 1 TABLET BY MOUTH AT BEDTIME.   [DISCONTINUED] celecoxib (CELEBREX) 200 MG capsule Take 200 mg by mouth daily as needed for mild pain or moderate pain.    [DISCONTINUED] LORATADINE-D 12HR 5-120 MG tablet TAKE 1 TABLET BY MOUTH TWICE DAILY. NEEDS NEW DOCTOR. DOCTOR RETIRED.   [DISCONTINUED] oxyCODONE (ROXICODONE) 15 MG immediate release tablet TAKE 1/2 TABLET BY MOUTH ONCE DAILY AS  NEEDED   [DISCONTINUED] sulfamethoxazole-trimethoprim (BACTRIM DS) 800-160 MG tablet Take 1 tablet by mouth 2 (two) times daily.   nitroGLYCERIN (NITROSTAT) 0.4 MG SL tablet Place 1 tablet (0.4 mg total) under the tongue every 5 (five) minutes as needed for chest pain. If you require more than two tablets five minutes apart go to the nearest ER via EMS.   No facility-administered medications prior to visit.    Review of Systems  Constitutional:  Positive for fatigue. Negative for activity change, chills and fever.  HENT:  Positive for congestion, postnasal drip, rhinorrhea, sinus pressure and sinus pain. Negative for sneezing and sore throat.   Eyes: Negative.   Respiratory:  Positive for cough and wheezing. Negative for shortness of breath.   Cardiovascular:  Negative for chest pain and palpitations.  Gastrointestinal:  Negative for constipation, diarrhea, nausea and vomiting.  Endocrine: Negative for cold intolerance, heat intolerance, polydipsia and polyuria.  Genitourinary:  Negative for dysuria, frequency and urgency.  Musculoskeletal:  Negative for back pain and myalgias.  Skin:  Negative for rash.  Allergic/Immunologic: Positive for environmental allergies.  Neurological:  Negative for dizziness, weakness and headaches.  Psychiatric/Behavioral:  The patient is not nervous/anxious.      Objective     Today's Vitals   05/31/22 1449  BP: 107/67  Pulse: 78  SpO2: 93%  Weight: 247 lb 1.9 oz (112.1 kg)  Height: 5' 8.9" (1.75 m)   Body mass index is 36.6 kg/m.   Physical Exam  Vitals and nursing note reviewed.  Constitutional:      Appearance: Normal appearance. He is well-developed. He is ill-appearing.  HENT:     Head: Normocephalic and atraumatic.     Right Ear: Tympanic membrane is erythematous and bulging.     Left Ear: Tympanic membrane is erythematous and bulging.     Nose: Congestion present.     Right Turbinates: Swollen.     Left Turbinates: Swollen.     Right  Sinus: Maxillary sinus tenderness and frontal sinus tenderness present.     Left Sinus: Maxillary sinus tenderness and frontal sinus tenderness present.  Eyes:     Pupils: Pupils are equal, round, and reactive to light.  Cardiovascular:     Rate and Rhythm: Normal rate and regular rhythm.     Pulses: Normal pulses.     Heart sounds: Normal heart sounds.  Pulmonary:     Effort: Pulmonary effort is normal.     Breath sounds: Normal breath sounds.     Comments: Dry, intermittent cough noted  Abdominal:     Palpations: Abdomen is soft.  Musculoskeletal:        General: Normal range of motion.     Cervical back: Normal range of motion and neck supple.  Lymphadenopathy:     Cervical: Cervical adenopathy present.  Skin:    General: Skin is warm and dry.     Capillary Refill: Capillary refill takes less than 2 seconds.  Neurological:     General: No focal deficit present.     Mental Status: He is alert and oriented to person, place, and time.  Psychiatric:        Mood and Affect: Mood normal.        Behavior: Behavior normal.        Thought Content: Thought content normal.        Judgment: Judgment normal.       Assessment & Plan     1. Acute recurrent maxillary sinusitis Start z-pack. Take  as directed for 5 days. Rest and increase fluids. Continue using OTC medication to control symptoms.  Add nasacort nasal spray. Use two sprays into both nostrils daily  - azithromycin (ZITHROMAX) 250 MG tablet; z-pack - take as directed for 5 days  Dispense: 6 tablet; Refill: 0 - triamcinolone (NASACORT) 55 MCG/ACT AERO nasal inhaler; Place 2 sprays into the nose daily.  Dispense: 1 each; Refill: 11  2. Wheezing Start medrol taper. Take as directed for 6 days   - methylPREDNISolone (MEDROL) 4 MG TBPK tablet; Take by mouth as directed for 6 days  Dispense: 21 tablet; Refill: 0  3. 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 A single prescriptoin for #15 tablets of oxycodone were sent to his pharmacy. He takes 1/2 tablet as needed for severe pain. We reviewed the  risk factors associated with taking narcotic pain medication. He understands not to drive or to work after taking this medication as it may cause dizziness or drowsiness.  - celecoxib (CELEBREX) 200 MG capsule; Take 1 capsule (200 mg total) by mouth daily as needed for mild pain or moderate pain.  Dispense: 30 capsule; Refill: 3 - oxyCODONE (ROXICODONE) 15 MG immediate release tablet; TAKE 1/2 TABLET BY MOUTH ONCE DAILY AS NEEDED  Dispense: 15 tablet; Refill: 0   Return in about 4 months (around 09/30/2022) for testosterone , FBW a week prior to visit.  Ronnell Freshwater, NP  [facility] at [facility] [phone] (phone) [phone] (fax)  [place]

## 2022-06-03 ENCOUNTER — Other Ambulatory Visit: Payer: Self-pay | Admitting: Physician Assistant

## 2022-06-09 ENCOUNTER — Other Ambulatory Visit: Payer: Self-pay | Admitting: Nurse Practitioner

## 2022-06-09 DIAGNOSIS — F5101 Primary insomnia: Secondary | ICD-10-CM

## 2022-06-09 DIAGNOSIS — R062 Wheezing: Secondary | ICD-10-CM | POA: Insufficient documentation

## 2022-06-13 ENCOUNTER — Other Ambulatory Visit: Payer: Self-pay | Admitting: Nurse Practitioner

## 2022-07-04 ENCOUNTER — Other Ambulatory Visit: Payer: Self-pay | Admitting: Physician Assistant

## 2022-07-04 DIAGNOSIS — I10 Essential (primary) hypertension: Secondary | ICD-10-CM

## 2022-07-12 ENCOUNTER — Other Ambulatory Visit: Payer: Self-pay | Admitting: Physician Assistant

## 2022-07-25 ENCOUNTER — Other Ambulatory Visit: Payer: Self-pay | Admitting: Nurse Practitioner

## 2022-07-25 DIAGNOSIS — R7989 Other specified abnormal findings of blood chemistry: Secondary | ICD-10-CM

## 2022-08-01 NOTE — Progress Notes (Signed)
Patient was a now show for this visit

## 2022-08-09 ENCOUNTER — Other Ambulatory Visit: Payer: Self-pay | Admitting: Nurse Practitioner

## 2022-08-11 ENCOUNTER — Other Ambulatory Visit: Payer: Self-pay | Admitting: Nurse Practitioner

## 2022-08-16 ENCOUNTER — Other Ambulatory Visit: Payer: Self-pay | Admitting: Nurse Practitioner

## 2022-08-22 ENCOUNTER — Other Ambulatory Visit: Payer: Self-pay | Admitting: Nurse Practitioner

## 2022-09-09 ENCOUNTER — Other Ambulatory Visit: Payer: Self-pay | Admitting: Nurse Practitioner

## 2022-09-09 DIAGNOSIS — F5101 Primary insomnia: Secondary | ICD-10-CM

## 2022-09-13 ENCOUNTER — Other Ambulatory Visit: Payer: Self-pay

## 2022-09-13 DIAGNOSIS — E039 Hypothyroidism, unspecified: Secondary | ICD-10-CM

## 2022-09-13 DIAGNOSIS — I10 Essential (primary) hypertension: Secondary | ICD-10-CM

## 2022-09-13 DIAGNOSIS — Z0000 Encounter for general adult medical examination without abnormal findings: Secondary | ICD-10-CM

## 2022-09-13 DIAGNOSIS — R7989 Other specified abnormal findings of blood chemistry: Secondary | ICD-10-CM

## 2022-09-17 ENCOUNTER — Other Ambulatory Visit: Payer: Self-pay | Admitting: Nurse Practitioner

## 2022-09-17 DIAGNOSIS — M4716 Other spondylosis with myelopathy, lumbar region: Secondary | ICD-10-CM

## 2022-09-22 ENCOUNTER — Other Ambulatory Visit: Payer: Medicare Other

## 2022-09-22 DIAGNOSIS — E039 Hypothyroidism, unspecified: Secondary | ICD-10-CM

## 2022-09-22 DIAGNOSIS — I10 Essential (primary) hypertension: Secondary | ICD-10-CM

## 2022-09-22 DIAGNOSIS — R7989 Other specified abnormal findings of blood chemistry: Secondary | ICD-10-CM

## 2022-09-22 DIAGNOSIS — Z0000 Encounter for general adult medical examination without abnormal findings: Secondary | ICD-10-CM

## 2022-09-23 ENCOUNTER — Other Ambulatory Visit: Payer: Self-pay | Admitting: Nurse Practitioner

## 2022-09-23 LAB — CBC WITH DIFFERENTIAL/PLATELET
Basophils Absolute: 0.1 10*3/uL (ref 0.0–0.2)
Basos: 1 %
EOS (ABSOLUTE): 0.5 10*3/uL — ABNORMAL HIGH (ref 0.0–0.4)
Eos: 5 %
Hematocrit: 56.6 % — ABNORMAL HIGH (ref 37.5–51.0)
Hemoglobin: 19 g/dL — ABNORMAL HIGH (ref 13.0–17.7)
Immature Grans (Abs): 0.1 10*3/uL (ref 0.0–0.1)
Immature Granulocytes: 1 %
Lymphocytes Absolute: 1.7 10*3/uL (ref 0.7–3.1)
Lymphs: 15 %
MCH: 30.5 pg (ref 26.6–33.0)
MCHC: 33.6 g/dL (ref 31.5–35.7)
MCV: 91 fL (ref 79–97)
Monocytes Absolute: 1.6 10*3/uL — ABNORMAL HIGH (ref 0.1–0.9)
Monocytes: 14 %
Neutrophils Absolute: 7.5 10*3/uL — ABNORMAL HIGH (ref 1.4–7.0)
Neutrophils: 64 %
Platelets: 187 10*3/uL (ref 150–450)
RBC: 6.23 x10E6/uL — ABNORMAL HIGH (ref 4.14–5.80)
RDW: 13.1 % (ref 11.6–15.4)
WBC: 11.5 10*3/uL — ABNORMAL HIGH (ref 3.4–10.8)

## 2022-09-23 LAB — TESTOSTERONE: Testosterone: 432 ng/dL (ref 264–916)

## 2022-09-23 LAB — COMPREHENSIVE METABOLIC PANEL
ALT: 39 IU/L (ref 0–44)
AST: 28 IU/L (ref 0–40)
Albumin/Globulin Ratio: 1.8 (ref 1.2–2.2)
Albumin: 4.2 g/dL (ref 3.9–4.9)
Alkaline Phosphatase: 76 IU/L (ref 44–121)
BUN/Creatinine Ratio: 14 (ref 10–24)
BUN: 14 mg/dL (ref 8–27)
Bilirubin Total: 0.7 mg/dL (ref 0.0–1.2)
CO2: 27 mmol/L (ref 20–29)
Calcium: 9.6 mg/dL (ref 8.6–10.2)
Chloride: 100 mmol/L (ref 96–106)
Creatinine, Ser: 1 mg/dL (ref 0.76–1.27)
Globulin, Total: 2.4 g/dL (ref 1.5–4.5)
Glucose: 98 mg/dL (ref 70–99)
Potassium: 4.4 mmol/L (ref 3.5–5.2)
Sodium: 139 mmol/L (ref 134–144)
Total Protein: 6.6 g/dL (ref 6.0–8.5)
eGFR: 83 mL/min/{1.73_m2} (ref >59)

## 2022-09-23 LAB — HEMOGLOBIN A1C
Est. average glucose Bld gHb Est-mCnc: 108 mg/dL
Hgb A1c MFr Bld: 5.4 % (ref 4.8–5.6)

## 2022-09-23 LAB — LIPID PANEL
Chol/HDL Ratio: 4.5 ratio (ref 0.0–5.0)
Cholesterol, Total: 154 mg/dL (ref 100–199)
HDL: 34 mg/dL — ABNORMAL LOW (ref >39)
LDL Chol Calc (NIH): 95 mg/dL (ref 0–99)
Triglycerides: 138 mg/dL (ref 0–149)
VLDL Cholesterol Cal: 25 mg/dL (ref 5–40)

## 2022-09-23 LAB — TSH: TSH: 2.02 u[IU]/mL (ref 0.450–4.500)

## 2022-09-23 LAB — PROLACTIN: Prolactin: 16.3 ng/mL — ABNORMAL HIGH (ref 4.0–15.2)

## 2022-09-29 ENCOUNTER — Other Ambulatory Visit: Payer: Self-pay | Admitting: Nurse Practitioner

## 2022-09-29 ENCOUNTER — Ambulatory Visit (INDEPENDENT_AMBULATORY_CARE_PROVIDER_SITE_OTHER): Payer: No Typology Code available for payment source | Admitting: Nurse Practitioner

## 2022-09-29 ENCOUNTER — Encounter: Payer: Self-pay | Admitting: Nurse Practitioner

## 2022-09-29 VITALS — BP 121/78 | HR 76 | Ht 68.9 in | Wt 262.0 lb

## 2022-09-29 DIAGNOSIS — M4716 Other spondylosis with myelopathy, lumbar region: Secondary | ICD-10-CM

## 2022-09-29 DIAGNOSIS — R0683 Snoring: Secondary | ICD-10-CM | POA: Diagnosis not present

## 2022-09-29 DIAGNOSIS — D582 Other hemoglobinopathies: Secondary | ICD-10-CM

## 2022-09-29 DIAGNOSIS — I10 Essential (primary) hypertension: Secondary | ICD-10-CM | POA: Diagnosis not present

## 2022-09-29 DIAGNOSIS — Z6838 Body mass index (BMI) 38.0-38.9, adult: Secondary | ICD-10-CM

## 2022-09-29 DIAGNOSIS — F5101 Primary insomnia: Secondary | ICD-10-CM

## 2022-09-29 DIAGNOSIS — R7989 Other specified abnormal findings of blood chemistry: Secondary | ICD-10-CM

## 2022-09-29 DIAGNOSIS — G4719 Other hypersomnia: Secondary | ICD-10-CM

## 2022-09-29 DIAGNOSIS — Z1211 Encounter for screening for malignant neoplasm of colon: Secondary | ICD-10-CM

## 2022-09-29 MED ORDER — LORATADINE-D 12HR 5-120 MG PO TB12: 1.0000 | ORAL_TABLET | Freq: Two times a day (BID) | ORAL | 3 refills | Status: DC

## 2022-09-29 MED ORDER — OXYCODONE HCL 15 MG PO TABS: ORAL_TABLET | ORAL | 0 refills | Status: DC

## 2022-09-29 MED ORDER — BISOPROLOL-HYDROCHLOROTHIAZIDE 2.5-6.25 MG PO TABS: ORAL_TABLET | ORAL | 3 refills | Status: DC

## 2022-09-29 MED ORDER — ZOLPIDEM TARTRATE 10 MG PO TABS: 10.0000 mg | ORAL_TABLET | Freq: Every day | ORAL | 3 refills | Status: DC

## 2022-09-29 MED ORDER — TESTOSTERONE CYPIONATE 200 MG/ML IM SOLN: INTRAMUSCULAR | 1 refills | Status: DC

## 2022-09-29 MED ORDER — TAMSULOSIN HCL 0.4 MG PO CAPS: 0.4000 mg | ORAL_CAPSULE | Freq: Every day | ORAL | 3 refills | Status: DC

## 2022-09-29 MED ORDER — CELECOXIB 200 MG PO CAPS: 200.0000 mg | ORAL_CAPSULE | Freq: Every day | ORAL | 3 refills | Status: DC | PRN

## 2022-09-29 NOTE — Progress Notes (Signed)
Reviewed with patient during visit 09/29/2022

## 2022-09-29 NOTE — Progress Notes (Signed)
Established patient visit   Patient: Ronald Parsons   DOB: 1956/01/28   66 y.o. Male  MRN: [id number] Visit Date: 09/29/2022   Chief Complaint  Patient presents with   Follow-up   Subjective    HPI  Follow up visit -hypertension  --well controlled  Low testosterone  -checked levels prior to visit --normal testosterone --slightly elevated prolactin  Routine, fasting labs done prior to today  -elevated Hgb and Hct -other labs essentially normal   Patient reporting excess daytime sleepiness  -snoring -gasping for breath during sleep      Medications: Outpatient Medications Prior to Visit  Medication Sig   aspirin EC 81 MG tablet Take 1 tablet (81 mg total) by mouth daily. Swallow whole.   B-D 3CC LUER-LOK SYR 22GX1-1/2 22G X 1-1/2" 3 ML MISC USE AS DIRECTED   cyclobenzaprine (FLEXERIL) 10 MG tablet Take 10 mg by mouth 3 (three) times daily as needed for muscle spasms.   methylPREDNISolone (MEDROL) 4 MG TBPK tablet Take by mouth as directed for 6 days   NON FORMULARY balance of nature   triamcinolone (NASACORT) 55 MCG/ACT AERO nasal inhaler Place 2 sprays into the nose daily.   [DISCONTINUED] azithromycin (ZITHROMAX) 250 MG tablet z-pack - take as directed for 5 days   [DISCONTINUED] bisoprolol-hydrochlorothiazide (ZIAC) 2.5-6.25 MG tablet TAKE 1/2 TO 1 TABLET BY MOUTH ONCE DAILY   [DISCONTINUED] celecoxib (CELEBREX) 200 MG capsule TAKE 1 CAPSULE (200 MG TOTAL) BY MOUTH DAILY AS NEEDED FOR MILD PAIN OR MODERATE PAIN.   [DISCONTINUED] cetirizine-pseudoephedrine (ZYRTEC-D) 5-120 MG per tablet Take 1 tablet by mouth 2 (two) times daily as needed for allergies.   [DISCONTINUED] LORATADINE-D 12HR 5-120 MG tablet TAKE 1 TABLET BY MOUTH TWICE DAILY.   [DISCONTINUED] oxyCODONE (ROXICODONE) 15 MG immediate release tablet TAKE 1/2 TABLET BY MOUTH ONCE DAILY AS NEEDED   [DISCONTINUED] tamsulosin (FLOMAX) 0.4 MG CAPS capsule TAKE 1 CAPSULE BY MOUTH DAILY   [DISCONTINUED] testosterone  cypionate (DEPOTESTOSTERONE CYPIONATE) 200 MG/ML injection INJECT 1 ML INTO THE MUSCLE EVERY 21 DAYS.   [DISCONTINUED] zolpidem (AMBIEN) 10 MG tablet TAKE 1 TABLET BY MOUTH AT BEDTIME.   nitroGLYCERIN (NITROSTAT) 0.4 MG SL tablet Place 1 tablet (0.4 mg total) under the tongue every 5 (five) minutes as needed for chest pain. If you require more than two tablets five minutes apart go to the nearest ER via EMS.   No facility-administered medications prior to visit.    Review of Systems  Constitutional:  Positive for fatigue. Negative for activity change, chills and fever.  HENT:  Negative for congestion, postnasal drip, rhinorrhea, sinus pressure, sinus pain, sneezing and sore throat.        Snoring  Eyes: Negative.   Respiratory:  Negative for cough, shortness of breath and wheezing.        Gasping for breath during sleep   Cardiovascular:  Negative for chest pain and palpitations.  Gastrointestinal:  Negative for constipation, diarrhea, nausea and vomiting.  Endocrine: Negative for cold intolerance, heat intolerance, polydipsia and polyuria.       Treatment for chronic low testosterone.   Genitourinary:  Negative for dysuria, frequency and urgency.  Musculoskeletal:  Negative for back pain and myalgias.  Skin:  Negative for rash.  Allergic/Immunologic: Negative for environmental allergies.  Neurological:  Negative for dizziness, weakness and headaches.  Psychiatric/Behavioral:  The patient is not nervous/anxious.     Last CBC Lab Results  Component Value Date   WBC 11.5 (H) 09/22/2022   HGB  19.0 (H) 09/22/2022   HCT 56.6 (H) 09/22/2022   MCV 91 09/22/2022   MCH 30.5 09/22/2022   RDW 13.1 09/22/2022   PLT 187 38/46/6599   Last metabolic panel Lab Results  Component Value Date   GLUCOSE 98 09/22/2022   NA 139 09/22/2022   K 4.4 09/22/2022   CL 100 09/22/2022   CO2 27 09/22/2022   BUN 14 09/22/2022   CREATININE 1.00 09/22/2022   EGFR 83 09/22/2022   CALCIUM 9.6 09/22/2022    PROT 6.6 09/22/2022   ALBUMIN 4.2 09/22/2022   LABGLOB 2.4 09/22/2022   AGRATIO 1.8 09/22/2022   BILITOT 0.7 09/22/2022   ALKPHOS 76 09/22/2022   AST 28 09/22/2022   ALT 39 09/22/2022   ANIONGAP 10 06/09/2020   Last lipids Lab Results  Component Value Date   CHOL 154 09/22/2022   HDL 34 (L) 09/22/2022   LDLCALC 95 09/22/2022   TRIG 138 09/22/2022   CHOLHDL 4.5 09/22/2022   Last hemoglobin A1c Lab Results  Component Value Date   HGBA1C 5.4 09/22/2022   Last thyroid functions Lab Results  Component Value Date   TSH 2.020 09/22/2022       Objective     Today's Vitals   09/29/22 1039  BP: 121/78  Pulse: 76  SpO2: 95%  Weight: 262 lb (118.8 kg)  Height: 5' 8.9" (1.75 m)   Body mass index is 38.8 kg/m.  BP Readings from Last 3 Encounters:  09/29/22 121/78  05/31/22 107/67  03/01/22 122/73    Wt Readings from Last 3 Encounters:  09/29/22 262 lb (118.8 kg)  05/31/22 247 lb 1.9 oz (112.1 kg)  03/01/22 247 lb (112 kg)    Physical Exam Vitals and nursing note reviewed.  Constitutional:      Appearance: Normal appearance. He is well-developed.  HENT:     Head: Normocephalic and atraumatic.  Eyes:     Pupils: Pupils are equal, round, and reactive to light.  Cardiovascular:     Rate and Rhythm: Normal rate and regular rhythm.     Pulses: Normal pulses.     Heart sounds: Normal heart sounds.  Pulmonary:     Effort: Pulmonary effort is normal.     Breath sounds: Normal breath sounds.  Abdominal:     Palpations: Abdomen is soft.  Musculoskeletal:        General: Normal range of motion.     Cervical back: Normal range of motion and neck supple.  Lymphadenopathy:     Cervical: No cervical adenopathy.  Skin:    General: Skin is warm and dry.     Capillary Refill: Capillary refill takes less than 2 seconds.  Neurological:     General: No focal deficit present.     Mental Status: He is alert and oriented to person, place, and time.  Psychiatric:         Mood and Affect: Mood normal.        Behavior: Behavior normal.        Thought Content: Thought content normal.        Judgment: Judgment normal.     Assessment & Plan    1. Essential hypertension Blood pressure stable. Continue Ziac as prescribed  - bisoprolol-hydrochlorothiazide (ZIAC) 2.5-6.25 MG tablet; TAKE 1/2 TO 1 TABLET BY MOUTH ONCE DAILY  Dispense: 30 tablet; Refill: 3  2. Loud snoring Refer to sleep studies for further evaluation.  - Ambulatory referral to Sleep Studies  3. Excessive daytime sleepiness Refer to  sleep studies for further evaluation.  - Ambulatory referral to Sleep Studies  4. Primary insomnia May continue ambien 10 mg every evening as needed. New prescription sent to her pharamcy  - zolpidem (AMBIEN) 10 MG tablet; Take 1 tablet (10 mg total) by mouth at bedtime.  Dispense: 30 tablet; Refill: 3  5. Lumbar spondylosis with myelopathy L4-5 L5-S1 May take celebrex 200 mg daily as needed for pain and inflammation. Short term rx for oxycodone 15 mg. This may be taken daily if needed for severe pain. Prescription for #15 tablets sent to pharmacy.  - celecoxib (CELEBREX) 200 MG capsule; Take 1 capsule (200 mg total) by mouth daily as needed for mild pain or moderate pain.  Dispense: 30 capsule; Refill: 3 - oxyCODONE (ROXICODONE) 15 MG immediate release tablet; TAKE 1/2 TABLET BY MOUTH ONCE DAILY AS NEEDED  Dispense: 15 tablet; Refill: 0  6. Elevated hemoglobin ([place]) May be related to  OSA. Referral to sleep studies made today  - Ambulatory referral to Sleep Studies  7. BMI 38.0-38.9,adult Discussed lowering calorie intake to 1500 calories per day and incorporating exercise into daily routine to help lose weight.   8. Low testosterone in male Testosterone level stable. Continue testosterone injections 100 mg every 3 weeks as prescribed  - testosterone cypionate (DEPOTESTOSTERONE CYPIONATE) 200 MG/ML injection; INJECT 0.5 ML INTO THE MUSCLE EVERY 21 DAYS.   Dispense: 10 mL; Refill: 1  9. 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 L5-S1   Relevant Medications   celecoxib (CELEBREX) 200 MG capsule   oxyCODONE (ROXICODONE) 15 MG immediate release tablet     Other   Low testosterone in male   Relevant Medications   testosterone cypionate (DEPOTESTOSTERONE CYPIONATE) 200 MG/ML injection   Primary insomnia   Relevant Medications   zolpidem (AMBIEN) 10 MG tablet   Loud snoring   Relevant Orders   Ambulatory referral to Sleep Studies   Excessive daytime sleepiness   Relevant Orders   Ambulatory referral to Sleep Studies   Other Visit Diagnoses     Elevated hemoglobin ([place])       Relevant Orders   Ambulatory referral to Sleep Studies   BMI 38.0-38.9,adult       Screening for colon cancer       Relevant Orders   Ambulatory referral to Gastroenterology        Return in about 4 months (around 01/29/2023) for medicare wellness, FBW a week prior to visit with testosterone and prolctin .         Ronnell Freshwater, NP  [facility] [phone] (phone) [phone] (fax)  [place]

## 2022-10-04 ENCOUNTER — Telehealth: Payer: Self-pay | Admitting: *Deleted

## 2022-10-04 ENCOUNTER — Other Ambulatory Visit: Payer: Self-pay | Admitting: Nurse Practitioner

## 2022-10-04 DIAGNOSIS — J0101 Acute recurrent maxillary sinusitis: Secondary | ICD-10-CM

## 2022-10-04 MED ORDER — AZITHROMYCIN 250 MG PO TABS: ORAL_TABLET | ORAL | 0 refills | Status: DC

## 2022-10-04 NOTE — Telephone Encounter (Signed)
Pt calls stating he has sinus pressure, congestion, cough, no fever, no covid.  Head started hurting yesterday and the rest of these sympltoms started on Sunday.  He said that previously he has had medication sent in for this and wanted to see if you could send in something without seeing him.  Told pt that normally for antibiotic you have to be evaluated and he still wanted to check with provider. Taylour Lietzke Zimmerman Rumple, CMA

## 2022-10-04 NOTE — Telephone Encounter (Signed)
Normally, yes. But I just saw him on Friday. I feel comfortable sending him in Z-pack. Sent this to [facility]. He should rest and increase fluids. Take OTC medication to control symptoms.

## 2022-10-05 NOTE — Telephone Encounter (Signed)
LVM for pt to call office to inform him of below. Brooklin Rieger Zimmerman Rumple, CMA  

## 2022-10-06 ENCOUNTER — Other Ambulatory Visit: Payer: Self-pay | Admitting: Nurse Practitioner

## 2022-10-06 DIAGNOSIS — U071 COVID-19: Secondary | ICD-10-CM

## 2022-10-06 MED ORDER — MOLNUPIRAVIR EUA 200MG CAPSULE: 4.0000 | ORAL_CAPSULE | Freq: Two times a day (BID) | ORAL | 0 refills | Status: AC

## 2022-10-06 NOTE — Telephone Encounter (Signed)
I sent him In prescription for molnipivir which he should take 4 capsules twice daily for 5 days. He can continue with the z-pack as well. He should rest and increase fluids. Continue using OTC medication to control symptoms.   Thanks  -HB

## 2022-10-06 NOTE — Telephone Encounter (Signed)
Pt called to confirmed he took 2 COVID test yesterday and was COVID POS.   Pt want to know is there anything else that he can take for COVID and if he should stop the Zpack  Please advise

## 2022-10-07 NOTE — Telephone Encounter (Signed)
It was no problem

## 2022-10-17 DIAGNOSIS — G4719 Other hypersomnia: Secondary | ICD-10-CM | POA: Insufficient documentation

## 2022-10-17 DIAGNOSIS — R0683 Snoring: Secondary | ICD-10-CM | POA: Insufficient documentation

## 2022-11-07 ENCOUNTER — Encounter: Payer: Self-pay | Admitting: Neurology

## 2022-11-07 ENCOUNTER — Ambulatory Visit (INDEPENDENT_AMBULATORY_CARE_PROVIDER_SITE_OTHER): Payer: No Typology Code available for payment source | Admitting: Neurology

## 2022-11-07 ENCOUNTER — Institutional Professional Consult (permissible substitution): Payer: No Typology Code available for payment source | Admitting: Neurology

## 2022-11-07 VITALS — BP 126/80 | HR 83 | Ht 69.5 in | Wt 266.8 lb

## 2022-11-07 DIAGNOSIS — R0683 Snoring: Secondary | ICD-10-CM | POA: Diagnosis not present

## 2022-11-07 DIAGNOSIS — G4730 Sleep apnea, unspecified: Secondary | ICD-10-CM

## 2022-11-07 DIAGNOSIS — R0681 Apnea, not elsewhere classified: Secondary | ICD-10-CM

## 2022-11-07 DIAGNOSIS — R351 Nocturia: Secondary | ICD-10-CM

## 2022-11-07 DIAGNOSIS — R0689 Other abnormalities of breathing: Secondary | ICD-10-CM

## 2022-11-07 DIAGNOSIS — Z79891 Long term (current) use of opiate analgesic: Secondary | ICD-10-CM

## 2022-11-07 DIAGNOSIS — Z9189 Other specified personal risk factors, not elsewhere classified: Secondary | ICD-10-CM | POA: Diagnosis not present

## 2022-11-07 DIAGNOSIS — E669 Obesity, unspecified: Secondary | ICD-10-CM

## 2022-11-07 DIAGNOSIS — R635 Abnormal weight gain: Secondary | ICD-10-CM

## 2022-11-07 NOTE — Progress Notes (Addendum)
Subjective:    Patient ID: Ronald Parsons is a 67 y.o. male.  HPI    Ronald Foley, MD, PhD [facility] [address] [address] [place], [place] [address]  Dear Ronald Parsons,  I saw your patient, Ronald Parsons, upon your kind request in my sleep clinic today for initial consultation of his sleep disorder, in particular, concern for underlying obstructive sleep apnea.  The patient is unaccompanied today.  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  The symptoms have been ongoing for years, they have worsened over the past 2 years or so.  He has also gained about 50 pounds in the past couple of years due to inactivity, particularly related to right knee pain.  Is Epworth sleepiness score is 4 out of 24, fatigue severity score is 47 out of 63.  I reviewed your office note from 09/29/2022.  He had been referred to our sleep clinic about 4 years ago by his primary care physician for sleep apnea concern, but did not have a sleep consultation at the time, due to Covid start.  He lives with his wife, they have no children.  They have 3 dogs in the household.  They do have a TV in the bedroom and it tends to stay on all night.  He quit smoking some 30+ years ago but does use smokeless tobacco, dips daily.  He takes occasional oxycodone, in the past, he was on morphine and Dilaudid and daily oxycodone also for years. He is not aware of any family history of sleep apnea but does report that his father possibly has sleep apnea and his paternal  uncle had probable sleep apnea.  He drinks caffeine in the form of iced tea, about 1 glass/day, he drinks alcohol infrequently, maybe once a month.  He does take Ambien nightly and has done so for about 10 years.  Bedtime is generally around 1 AM and rise time between 6 and 8 AM.  He has nocturia once per average night, typically in the early morning hours around 4 or 4:30 AM.  He is retired, he used to be a Market researcher.   His Past Medical History Is Significant For: Past Medical History:  Diagnosis Date   Asthma    allergy related   Cervical radiculitis    Depression    DISH (diffuse idiopathic skeletal hyperostosis)    Gout    Hyperlipidemia    Hypertension    Lumbago    Osteoarthritis     His Past Surgical History Is Significant For: Past Surgical History:  Procedure Laterality Date   BACK SURGERY  10/24/1989   laminectomy   CARDIAC CATHETERIZATION     5-7 years ago, states it was clear   COLONOSCOPY     HERNIA REPAIR     inguinal hernia repair as a baby on right groin   KNEE SURGERY Right    2023   RIGHT/LEFT HEART CATH AND CORONARY ANGIOGRAPHY N/A 06/09/2020   Procedure: RIGHT/LEFT HEART CATH AND CORONARY ANGIOGRAPHY;  Surgeon: Yates Decamp, MD;  Location: [facility];  Service: Cardiovascular;  Laterality: N/A;  SHOULDER SURGERY Bilateral    2017    His Family History Is Significant For: Family History  Problem Relation Age of Onset   Heart disease Father    Hypertension Father    Breast cancer Sister    Valvular heart disease Maternal Grandfather    Emphysema Paternal Grandfather     His Social History Is Significant For: Social History   Socioeconomic History   Marital status: Married    Spouse name: Not on file   Number of children: Not on file   Years of education: Not on file   Highest education level: Not on file  Occupational History   Not on file  Tobacco Use   Smoking status: Former   Smokeless tobacco: Current    Types: Chew  Vaping Use    Vaping Use: Never used  Substance and Sexual Activity   Alcohol use: Yes    Comment: once monthly   Drug use: No   Sexual activity: Not Currently  Other Topics Concern   Not on file  Social History Narrative   Lives at home with wife.  Retired Youth worker. Caffiene iced tea 1 glass daily.     Social Determinants of Health   Financial Resource Strain: Not on file  Food Insecurity: Not on file  Transportation Needs: Not on file  Physical Activity: Not on file  Stress: Not on file  Social Connections: Not on file    His Allergies Are:  Allergies  Allergen Reactions   Hydromorphone Hcl Other (See Comments)    Unruly   Penicillins Hives    As a child   Lovastatin Other (See Comments)    Myalgia  :   His Current Medications Are:  Outpatient Encounter Medications as of 11/07/2022  Medication Sig   B-D 3CC LUER-LOK SYR 22GX1-1/2 22G X 1-1/2" 3 ML MISC USE AS DIRECTED   bisoprolol-hydrochlorothiazide (ZIAC) 2.5-6.25 MG tablet TAKE 1/2 TO 1 TABLET BY MOUTH ONCE DAILY   celecoxib (CELEBREX) 200 MG capsule Take 1 capsule (200 mg total) by mouth daily as needed for mild pain or moderate pain.   clobetasol ointment (TEMOVATE) 0.05 % APPLY TO AFFECTED AREA ON THE SKIN 2 TIMES PER DAY FOR 10 DAYS   loratadine-pseudoephedrine (LORATADINE-D 12HR) 5-120 MG tablet Take 1 tablet by mouth 2 (two) times daily.   nitroGLYCERIN (NITROSTAT) 0.4 MG SL tablet Place 1 tablet (0.4 mg total) under the tongue every 5 (five) minutes as needed for chest pain. If you require more than two tablets five minutes apart go to the nearest ER via EMS.   NON FORMULARY balance of nature   oxyCODONE (ROXICODONE) 15 MG immediate release tablet TAKE 1/2 TABLET BY MOUTH ONCE DAILY AS NEEDED   tamsulosin (FLOMAX) 0.4 MG CAPS capsule Take 1 capsule (0.4 mg total) by mouth daily.   testosterone cypionate (DEPOTESTOSTERONE CYPIONATE) 200 MG/ML injection INJECT 0.5 ML INTO THE MUSCLE EVERY 21 DAYS.   triamcinolone  (NASACORT) 55 MCG/ACT AERO nasal inhaler Place 2 sprays into the nose daily.   zolpidem (AMBIEN) 10 MG tablet Take 1 tablet (10 mg total) by mouth at bedtime.   [DISCONTINUED] aspirin EC 81 MG tablet Take 1 tablet (81 mg total) by mouth daily. Swallow whole.   [DISCONTINUED] azithromycin (ZITHROMAX) 250 MG tablet z-pack - take as directed for 5 days   [DISCONTINUED] cyclobenzaprine (FLEXERIL) 10 MG tablet Take 10 mg by mouth 3 (three) times daily as needed for muscle spasms.   [DISCONTINUED] methylPREDNISolone (MEDROL) 4 MG  TBPK tablet Take by mouth as directed for 6 days   No facility-administered encounter medications on file as of 11/07/2022.  :   Review of Systems:  Out of a complete 14 point review of systems, all are reviewed and negative with the exception of these symptoms as listed below:  Review of Systems  Neurological:        Pt here for sleep consult.  ESS 4 FSS 47.  "Does not sleep well)" per pt. Snores. Gasps when sleeping.    Objective:  Neurological Exam  Physical Exam Physical Examination:   Vitals:   11/07/22 1227  BP: 126/80  Pulse: 83    General Examination: The patient is a very pleasant 67 y.o. male in no acute distress. He appears well-developed and well-nourished and well groomed.   HEENT: Normocephalic, atraumatic, pupils are equal, round and reactive to light, extraocular tracking is good without limitation to gaze excursion or nystagmus noted. Hearing is grossly intact. Face is symmetric with normal facial animation. Speech is clear with no dysarthria noted. There is no hypophonia. There is no lip, neck/head, jaw or voice tremor. Neck is supple with full range of passive and active motion. There are no carotid bruits on auscultation. Oropharynx exam reveals: moderate mouth dryness, adequate dental hygiene and moderate airway crowding, due to smaller airway entry, Mallampati class IV. Tongue protrudes centrally and palate elevates symmetrically. Tonsils are  1+ in size. Neck size is 19 7/8 inches. He has a minimal overbite. Nasal inspection reveals some nasal mucosal bogginess and narrow nasal passages.   Chest: Clear to auscultation without wheezing, rhonchi or crackles noted.  Heart: S1+S2+0, regular and normal with 4 out of 6 systolic heart murmur noted.     Abdomen: Soft, non-tender and non-distended.  Extremities: There is 1+ pitting edema in the distal lower extremities bilaterally.   Skin: Warm and dry without trophic changes noted.   Musculoskeletal: exam reveals arthritic changes in both hands, R knee swelling. Increase in lumbar kyphosis and upper body tilt to L.   Neurologically:  Mental status: The patient is awake, alert and oriented in all 4 spheres. His immediate and remote memory, attention, language skills and fund of knowledge are appropriate. There is no evidence of aphasia, agnosia, apraxia or anomia. Speech is clear with normal prosody and enunciation. Thought process is linear. Mood is normal and affect is normal.  Cranial nerves II - XII are as described above under HEENT exam.  Motor exam: Normal bulk, strength and tone is noted. There is no obvious action or resting tremor.  Fine motor skills and coordination: grossly intact.  Cerebellar testing: No dysmetria or intention tremor. There is no truncal or gait ataxia.  Sensory exam: intact to light touch in the upper and lower extremities.  Gait, station and balance: He stands slowly with abnormal posture, walks slowly, no walking aid.    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 A laboratory attended sleep study  is typically considered "gold standard" for evaluation of sleep disordered breathing.   I had a long chat with the patient about my findings and the diagnosis of sleep apnea, particularly OSA, its prognosis and treatment options. We talked about medical/conservative treatments, surgical interventions and non-pharmacological approaches for symptom control. I explained, in particular, the risks and ramifications of untreated moderate to severe OSA, especially with respect to developing cardiovascular disease down the road, including congestive heart failure (CHF), difficult to treat hypertension, cardiac arrhythmias (particularly A-fib), neurovascular complications including TIA, stroke and dementia. Even type 2 diabetes has, in part, been linked to untreated OSA. Symptoms of untreated OSA may include (but may not be limited to) daytime sleepiness, nocturia (i.e. frequent nighttime urination), memory problems, mood irritability and suboptimally controlled or worsening mood disorder such as depression and/or anxiety, lack of energy, lack of motivation, physical discomfort, as well as recurrent headaches, especially morning or nocturnal headaches. We talked about the importance of maintaining a healthy lifestyle and striving for healthy weight.  The importance of complete tobacco cessation was also addressed.  In addition, we talked about the importance of striving for and maintaining good sleep hygiene. I recommended a sleep study at this time.  I noted a systolic heart murmur.  Of note, he had seen a cardiologist in 2021.  He is advised to consider seeing a cardiologist again, he has a history of aortic stenosis.   I  outlined the differences between a laboratory attended sleep study which is considered more comprehensive and accurate over the option of a home sleep test (HST); the latter may lead to underestimation of sleep disordered breathing in some instances and does not help with diagnosing upper airway resistance syndrome and is not accurate enough to diagnose primary central sleep apnea typically. I outlined possible surgical and non-surgical treatment options of OSA, including the use of a positive airway pressure (PAP) device (i.e. CPAP, AutoPAP/APAP or BiPAP in certain circumstances), a custom-made dental device (aka oral appliance, which would require a referral to a specialist dentist or orthodontist typically, and is generally speaking not considered for patients with full dentures or edentulous state), upper airway surgical options, such as traditional UPPP (which is not considered a first-line treatment) or the Inspire device (hypoglossal nerve stimulator, which would involve a referral for consultation with an ENT surgeon, after careful selection, following inclusion criteria - also not first-line treatment). I explained the PAP treatment option to the patient in detail, as this is generally considered first-line treatment.  The patient indicated that he would be willing to try PAP therapy, if the need arises. I explained the importance of being compliant with PAP treatment, not only for insurance purposes but primarily to improve patient's symptoms symptoms, and for the patient's long term health benefit, including to reduce His cardiovascular risks longer-term.    We will pick up our discussion about the next steps and treatment options after testing.  We will keep him posted as to the test results by phone call and/or MyChart messaging where possible.  We will plan to follow-up in sleep clinic accordingly as well.  I answered all his questions today and the patient was in agreement.   I encouraged him to  call with any interim questions, concerns, problems or updates or email [place] through MyChart.  Generally speaking, sleep test authorizations may take up to 2 weeks, sometimes less, sometimes longer, the patient is encouraged to get in touch with [place] if they do not hear back from the  sleep lab staff directly within the next 2 weeks.  Thank you very much for allowing me to participate in the care of this nice patient. If I can be of any further assistance to you please do not hesitate to call me at [phone].  Sincerely,   Star Age, MD, PhD

## 2022-11-07 NOTE — Patient Instructions (Addendum)
Thank you for choosing [facility] for your sleep related care! It was nice to meet you today!   Here is what we discussed today:   Please consider seeing a cardiologist again, you do have a history of aortic stenosis and had seen Dr. Terri Skains in 2021.  I recommend a follow-up with him.   Based on your symptoms and your exam I believe you are at risk for obstructive sleep apnea (aka OSA). We should proceed with a sleep study to determine whether you do or do not have OSA and how severe it is. Even, if you have mild OSA, I may want you to consider treatment with CPAP, as treatment of even borderline or mild sleep apnea can result and improvement of symptoms such as sleep disruption, daytime sleepiness, nighttime bathroom breaks, restless leg symptoms, improvement of headache syndromes, even improved mood disorder.   As explained, an attended sleep study (meaning you get to stay overnight in the sleep lab), lets [place] monitor sleep-related behaviors such as sleep talking and leg movements in sleep, in addition to monitoring for sleep apnea.  A home sleep test is a screening tool for sleep apnea diagnosis only, but unfortunately, does not help with any other sleep-related diagnoses.  Please remember, the long-term risks and ramifications of untreated moderate to severe obstructive sleep apnea may include (but are not limited to): increased risk for cardiovascular disease, including congestive heart failure, stroke, difficult to control hypertension, treatment resistant obesity, arrhythmias, especially irregular heartbeat commonly known as A. Fib. (atrial fibrillation); even type 2 diabetes has been linked to untreated OSA.   Other correlations that untreated obstructive sleep apnea include macular edema which is swelling of the retina in the eyes, droopy eyelid syndrome, and elevated hemoglobin and hematocrit levels (often referred to as polycythemia).  Sleep apnea can cause disruption of  sleep and sleep deprivation in most cases, which, in turn, can cause recurrent headaches, problems with memory, mood, concentration, focus, and vigilance. Most people with untreated sleep apnea report excessive daytime sleepiness, which can affect their ability to drive. Please do not drive or use heavy equipment or machinery, if you feel sleepy! Patients with sleep apnea can also develop difficulty initiating and maintaining sleep (aka insomnia).   Having sleep apnea may increase your risk for other sleep disorders, including involuntary behaviors sleep such as sleep terrors, sleep talking, sleepwalking.    Having sleep apnea can also increase your risk for restless leg syndrome and leg movements at night.   Please note that untreated obstructive sleep apnea may carry additional perioperative morbidity. Patients with significant obstructive sleep apnea (typically, in the moderate to severe degree) should receive, if possible, perioperative PAP (positive airway pressure) therapy and the surgeons and particularly the anesthesiologists should be informed of the diagnosis and the severity of the sleep disordered breathing.   We will call you or email you through [place] with regards to your test results and plan a follow-up in sleep clinic accordingly. Most likely, you will hear from one of our nurses.   Our sleep lab administrative assistant will call you to schedule your sleep study and give you further instructions, regarding the check in process for the sleep study, arrival time, what to bring, when you can expect to leave after the study, etc., and to answer any other logistical questions you may have. If you don't hear back from her by about 2 weeks from now, please feel free to call her direct line at [phone] or you  can call our general clinic number, or email [place] through My Chart.

## 2022-12-05 ENCOUNTER — Telehealth: Payer: Self-pay | Admitting: Neurology

## 2022-12-05 NOTE — Telephone Encounter (Signed)
[employer] pending uploaded notes

## 2022-12-12 NOTE — Telephone Encounter (Signed)
Checked status it is still pending.

## 2022-12-27 NOTE — Telephone Encounter (Signed)
[employer]- [name] Ronald KaufmannQQ:[id number] (exp. 12/05/22 to 06/11/23) & Medicare no auth req   Patient is scheduled at [facility] for 01/15/23 at 9 pm.  Mailed packet to the patient.

## 2023-01-13 ENCOUNTER — Encounter: Payer: Self-pay | Admitting: Nurse Practitioner

## 2023-01-13 ENCOUNTER — Ambulatory Visit (INDEPENDENT_AMBULATORY_CARE_PROVIDER_SITE_OTHER): Payer: No Typology Code available for payment source | Admitting: Nurse Practitioner

## 2023-01-13 VITALS — BP 109/81 | HR 73 | Ht 69.5 in | Wt 261.4 lb

## 2023-01-13 DIAGNOSIS — M25551 Pain in right hip: Secondary | ICD-10-CM

## 2023-01-13 DIAGNOSIS — M4716 Other spondylosis with myelopathy, lumbar region: Secondary | ICD-10-CM

## 2023-01-13 DIAGNOSIS — F5101 Primary insomnia: Secondary | ICD-10-CM

## 2023-01-13 DIAGNOSIS — J324 Chronic pansinusitis: Secondary | ICD-10-CM

## 2023-01-13 DIAGNOSIS — N400 Enlarged prostate without lower urinary tract symptoms: Secondary | ICD-10-CM

## 2023-01-13 DIAGNOSIS — R7989 Other specified abnormal findings of blood chemistry: Secondary | ICD-10-CM

## 2023-01-13 DIAGNOSIS — J0101 Acute recurrent maxillary sinusitis: Secondary | ICD-10-CM

## 2023-01-13 DIAGNOSIS — Z01818 Encounter for other preprocedural examination: Secondary | ICD-10-CM

## 2023-01-13 DIAGNOSIS — E785 Hyperlipidemia, unspecified: Secondary | ICD-10-CM

## 2023-01-13 DIAGNOSIS — I10 Essential (primary) hypertension: Secondary | ICD-10-CM

## 2023-01-13 DIAGNOSIS — R5383 Other fatigue: Secondary | ICD-10-CM

## 2023-01-13 DIAGNOSIS — G8929 Other chronic pain: Secondary | ICD-10-CM

## 2023-01-13 MED ORDER — ZOLPIDEM TARTRATE 10 MG PO TABS: 10.0000 mg | ORAL_TABLET | Freq: Every day | ORAL | 3 refills | Status: DC

## 2023-01-13 MED ORDER — CELECOXIB 200 MG PO CAPS: 200.0000 mg | ORAL_CAPSULE | Freq: Two times a day (BID) | ORAL | 3 refills | Status: DC | PRN

## 2023-01-13 MED ORDER — BISOPROLOL-HYDROCHLOROTHIAZIDE 2.5-6.25 MG PO TABS: ORAL_TABLET | ORAL | 3 refills | Status: DC

## 2023-01-13 MED ORDER — OXYCODONE HCL 15 MG PO TABS: ORAL_TABLET | ORAL | 0 refills | Status: DC

## 2023-01-13 MED ORDER — TAMSULOSIN HCL 0.4 MG PO CAPS: 0.4000 mg | ORAL_CAPSULE | Freq: Every day | ORAL | 3 refills | Status: DC

## 2023-01-13 MED ORDER — "BD LUER-LOK SYRINGE 22G X 1-1/2"" 3 ML MISC": 2 refills | Status: DC

## 2023-01-13 MED ORDER — TESTOSTERONE CYPIONATE 200 MG/ML IM SOLN: INTRAMUSCULAR | 1 refills | Status: DC

## 2023-01-13 MED ORDER — TRIAMCINOLONE ACETONIDE 55 MCG/ACT NA AERO: 2.0000 | INHALATION_SPRAY | Freq: Every day | NASAL | 11 refills | Status: DC

## 2023-01-13 NOTE — Progress Notes (Unsigned)
Established patient visit   Patient: Ronald Parsons   DOB: 05-Oct-1956   67 y.o. Male  MRN: [id number] Visit Date: 01/13/2023   Chief Complaint  Patient presents with   surgical clearan   Subjective    HPI  Surgical clearance and follow up  Having right hip pain  -due to have total hip replacement with date pending.  -needs to have EKG and labs and surgical clearance today  -after he heals from hip replacement, will have to have right knee replacement.  -due to have check of testosterone levels and routine labs.  -He denies chest pain, chest pressure, or shortness of breath. He denies headaches or visual disturbances. He denies abdominal pain, nausea, vomiting, or changes in bowel or bladder habits.    Medications: Outpatient Medications Prior to Visit  Medication Sig   clobetasol ointment (TEMOVATE) 0.05 % APPLY TO AFFECTED AREA ON THE SKIN 2 TIMES PER DAY FOR 10 DAYS   loratadine-pseudoephedrine (LORATADINE-D 12HR) 5-120 MG tablet Take 1 tablet by mouth 2 (two) times daily.   NON FORMULARY balance of nature   [DISCONTINUED] B-D 3CC LUER-LOK SYR 22GX1-1/2 22G X 1-1/2" 3 ML MISC USE AS DIRECTED   [DISCONTINUED] bisoprolol-hydrochlorothiazide (ZIAC) 2.5-6.25 MG tablet TAKE 1/2 TO 1 TABLET BY MOUTH ONCE DAILY   [DISCONTINUED] celecoxib (CELEBREX) 200 MG capsule Take 1 capsule (200 mg total) by mouth daily as needed for mild pain or moderate pain.   [DISCONTINUED] oxyCODONE (ROXICODONE) 15 MG immediate release tablet TAKE 1/2 TABLET BY MOUTH ONCE DAILY AS NEEDED   [DISCONTINUED] tamsulosin (FLOMAX) 0.4 MG CAPS capsule Take 1 capsule (0.4 mg total) by mouth daily.   [DISCONTINUED] testosterone cypionate (DEPOTESTOSTERONE CYPIONATE) 200 MG/ML injection INJECT 0.5 ML INTO THE MUSCLE EVERY 21 DAYS.   [DISCONTINUED] triamcinolone (NASACORT) 55 MCG/ACT AERO nasal inhaler Place 2 sprays into the nose daily.   [DISCONTINUED] zolpidem (AMBIEN) 10 MG tablet Take 1 tablet (10 mg total) by mouth  at bedtime.   nitroGLYCERIN (NITROSTAT) 0.4 MG SL tablet Place 1 tablet (0.4 mg total) under the tongue every 5 (five) minutes as needed for chest pain. If you require more than two tablets five minutes apart go to the nearest ER via EMS.   No facility-administered medications prior to visit.    Review of Systems  See HPI     Objective     Today's Vitals   01/13/23 1002  BP: 109/81  Pulse: 73  SpO2: 95%  Weight: 261 lb 6.4 oz (118.6 kg)  Height: 5' 9.5" (1.765 m)   Body mass index is 38.05 kg/m.  BP Readings from Last 3 Encounters:  01/13/23 109/81  11/07/22 126/80  09/29/22 121/78    Wt Readings from Last 3 Encounters:  01/13/23 261 lb 6.4 oz (118.6 kg)  11/07/22 266 lb 12.8 oz (121 kg)  09/29/22 262 lb (118.8 kg)    Physical Exam Vitals and nursing note reviewed.  Constitutional:      Appearance: Normal appearance. He is well-developed.  HENT:     Head: Normocephalic and atraumatic.     Nose: Nose normal.     Mouth/Throat:     Mouth: Mucous membranes are moist.     Pharynx: Oropharynx is clear.  Eyes:     Extraocular Movements: Extraocular movements intact.     Conjunctiva/sclera: Conjunctivae normal.     Pupils: Pupils are equal, round, and reactive to light.  Neck:     Vascular: No carotid bruit.  Cardiovascular:  Rate and Rhythm: Normal rate and regular rhythm.     Pulses: Normal pulses.     Heart sounds: Normal heart sounds.  Pulmonary:     Effort: Pulmonary effort is normal.     Breath sounds: Normal breath sounds.  Abdominal:     Palpations: Abdomen is soft.  Musculoskeletal:     Cervical back: Normal range of motion and neck supple.     Right hip: Bony tenderness present. Decreased range of motion. Decreased strength.     Comments: Patient walking with moderate to severe limp favoring the right side   Lymphadenopathy:     Cervical: No cervical adenopathy.  Skin:    General: Skin is warm and dry.     Capillary Refill: Capillary refill  takes less than 2 seconds.  Neurological:     General: No focal deficit present.     Mental Status: He is alert and oriented to person, place, and time.  Psychiatric:        Mood and Affect: Mood normal.        Behavior: Behavior normal.        Thought Content: Thought content normal.        Judgment: Judgment normal.     Results for orders placed or performed in visit on 01/13/23  Testosterone  Result Value Ref Range   Testosterone 1,285 (H) 264 - 916 ng/dL  Prolactin  Result Value Ref Range   Prolactin 12.5 3.6 - 25.2 ng/mL  CBC  Result Value Ref Range   WBC 9.5 3.4 - 10.8 x10E3/uL   RBC 6.01 (H) 4.14 - 5.80 x10E6/uL   Hemoglobin 18.9 (H) 13.0 - 17.7 g/dL   Hematocrit 54.7 (H) 37.5 - 51.0 %   MCV 91 79 - 97 fL   MCH 31.4 26.6 - 33.0 pg   MCHC 34.6 31.5 - 35.7 g/dL   RDW 14.5 11.6 - 15.4 %   Platelets 196 150 - 450 x10E3/uL  Comprehensive metabolic panel  Result Value Ref Range   Glucose 101 (H) 70 - 99 mg/dL   BUN 14 8 - 27 mg/dL   Creatinine, Ser 1.00 0.76 - 1.27 mg/dL   eGFR 83 >59 mL/min/1.73   BUN/Creatinine Ratio 14 10 - 24   Sodium 139 134 - 144 mmol/L   Potassium 4.2 3.5 - 5.2 mmol/L   Chloride 101 96 - 106 mmol/L   CO2 23 20 - 29 mmol/L   Calcium 9.8 8.6 - 10.2 mg/dL   Total Protein 6.7 6.0 - 8.5 g/dL   Albumin 4.3 3.9 - 4.9 g/dL   Globulin, Total 2.4 1.5 - 4.5 g/dL   Albumin/Globulin Ratio 1.8 1.2 - 2.2   Bilirubin Total 0.5 0.0 - 1.2 mg/dL   Alkaline Phosphatase 78 44 - 121 IU/L   AST 30 0 - 40 IU/L   ALT 44 0 - 44 IU/L  Lipid panel  Result Value Ref Range   Cholesterol, Total 166 100 - 199 mg/dL   Triglycerides 129 0 - 149 mg/dL   HDL 37 (L) >39 mg/dL   VLDL Cholesterol Cal 23 5 - 40 mg/dL   LDL Chol Calc (NIH) 106 (H) 0 - 99 mg/dL   Chol/HDL Ratio 4.5 0.0 - 5.0 ratio  PSA  Result Value Ref Range   Prostate Specific Ag, Serum 0.5 0.0 - 4.0 ng/mL  TSH + free T4  Result Value Ref Range   TSH 1.780 0.450 - 4.500 uIU/mL   Free T4 1.51 0.82 -  1.77 ng/dL    Assessment & Plan     Problem List Items Addressed This Visit       Cardiovascular and Mediastinum   Essential hypertension    Stable. Continue current medication.       Relevant Medications   bisoprolol-hydrochlorothiazide (ZIAC) 2.5-6.25 MG tablet   Other Relevant Orders   Comprehensive metabolic panel (Completed)   CBC (Completed)     Respiratory   Acute recurrent maxillary sinusitis    Stable. 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 Single prescription for #15 tablets sent to his pharmacy       Relevant Medications   celecoxib (CELEBREX) 200 MG capsule   oxyCODONE (ROXICODONE) 15 MG immediate release tablet     Other   Low testosterone in male    Check testosterone level with labs. Adjust testosterone level as indicated       Relevant Medications   SYRINGE-NEEDLE, DISP, 3 ML (B-D 3CC LUER-LOK SYR 22GX1-1/2) 22G X 1-1/2" 3 ML MISC   tamsulosin (FLOMAX) 0.4 MG CAPS capsule   testosterone cypionate (DEPOTESTOSTERONE CYPIONATE) 200 MG/ML injection   Other Relevant Orders   PSA (Completed)   Testosterone (Completed)   Prolactin (Completed)   Primary insomnia    May continue to take ambien at bedtime as needed. New prescription sent to his pharmacy today       Relevant Medications   zolpidem (AMBIEN) 10 MG tablet   Dyslipidemia, goal LDL below 100    Check fasting lipid panel with labs today       Relevant Medications   bisoprolol-hydrochlorothiazide (ZIAC) 2.5-6.25 MG tablet   Other Relevant Orders   Lipid panel (Completed)   Comprehensive metabolic panel (Completed)   Other fatigue    Check labs for further evaluation.       Relevant Orders   TSH + free T4 (Completed)   Hypertrophy of prostate    Check PSA with routine,  fasting labs today       Relevant Orders   PSA (Completed)   Chronic right hip pain - Primary    Surgical clearance today for total right hip replacement. Patient is surgically cleared with mild risk.       Relevant Medications   celecoxib (CELEBREX) 200 MG capsule   oxyCODONE (ROXICODONE) 15 MG immediate release tablet   Encounter for preoperative examination for general surgical procedure   Relevant Orders   EKG 12-Lead     Return in about 4 months (around 05/15/2023) for low testosterone .         Ronnell Freshwater, NP  [facility] at [facility] [phone] (phone) [phone] (fax)  [place]

## 2023-01-14 LAB — COMPREHENSIVE METABOLIC PANEL
ALT: 44 IU/L (ref 0–44)
AST: 30 IU/L (ref 0–40)
Albumin/Globulin Ratio: 1.8 (ref 1.2–2.2)
Albumin: 4.3 g/dL (ref 3.9–4.9)
Alkaline Phosphatase: 78 IU/L (ref 44–121)
BUN/Creatinine Ratio: 14 (ref 10–24)
BUN: 14 mg/dL (ref 8–27)
Bilirubin Total: 0.5 mg/dL (ref 0.0–1.2)
CO2: 23 mmol/L (ref 20–29)
Calcium: 9.8 mg/dL (ref 8.6–10.2)
Chloride: 101 mmol/L (ref 96–106)
Creatinine, Ser: 1 mg/dL (ref 0.76–1.27)
Globulin, Total: 2.4 g/dL (ref 1.5–4.5)
Glucose: 101 mg/dL — ABNORMAL HIGH (ref 70–99)
Potassium: 4.2 mmol/L (ref 3.5–5.2)
Sodium: 139 mmol/L (ref 134–144)
Total Protein: 6.7 g/dL (ref 6.0–8.5)
eGFR: 83 mL/min/{1.73_m2} (ref >59)

## 2023-01-14 LAB — CBC
Hematocrit: 54.7 % — ABNORMAL HIGH (ref 37.5–51.0)
Hemoglobin: 18.9 g/dL — ABNORMAL HIGH (ref 13.0–17.7)
MCH: 31.4 pg (ref 26.6–33.0)
MCHC: 34.6 g/dL (ref 31.5–35.7)
MCV: 91 fL (ref 79–97)
Platelets: 196 10*3/uL (ref 150–450)
RBC: 6.01 x10E6/uL — ABNORMAL HIGH (ref 4.14–5.80)
RDW: 14.5 % (ref 11.6–15.4)
WBC: 9.5 10*3/uL (ref 3.4–10.8)

## 2023-01-14 LAB — PROLACTIN: Prolactin: 12.5 ng/mL (ref 3.6–25.2)

## 2023-01-14 LAB — PSA: Prostate Specific Ag, Serum: 0.5 ng/mL (ref 0.0–4.0)

## 2023-01-14 LAB — TESTOSTERONE: Testosterone: 1285 ng/dL — ABNORMAL HIGH (ref 264–916)

## 2023-01-14 LAB — LIPID PANEL
Chol/HDL Ratio: 4.5 ratio (ref 0.0–5.0)
Cholesterol, Total: 166 mg/dL (ref 100–199)
HDL: 37 mg/dL — ABNORMAL LOW (ref >39)
LDL Chol Calc (NIH): 106 mg/dL — ABNORMAL HIGH (ref 0–99)
Triglycerides: 129 mg/dL (ref 0–149)
VLDL Cholesterol Cal: 23 mg/dL (ref 5–40)

## 2023-01-14 LAB — TSH+FREE T4
Free T4: 1.51 ng/dL (ref 0.82–1.77)
TSH: 1.78 u[IU]/mL (ref 0.450–4.500)

## 2023-01-15 ENCOUNTER — Ambulatory Visit (INDEPENDENT_AMBULATORY_CARE_PROVIDER_SITE_OTHER): Payer: No Typology Code available for payment source | Admitting: Neurology

## 2023-01-15 DIAGNOSIS — G4730 Sleep apnea, unspecified: Secondary | ICD-10-CM

## 2023-01-15 DIAGNOSIS — R0681 Apnea, not elsewhere classified: Secondary | ICD-10-CM

## 2023-01-15 DIAGNOSIS — R0683 Snoring: Secondary | ICD-10-CM | POA: Diagnosis not present

## 2023-01-15 DIAGNOSIS — G4719 Other hypersomnia: Secondary | ICD-10-CM | POA: Diagnosis not present

## 2023-01-15 DIAGNOSIS — Z9189 Other specified personal risk factors, not elsewhere classified: Secondary | ICD-10-CM

## 2023-01-15 DIAGNOSIS — R0689 Other abnormalities of breathing: Secondary | ICD-10-CM

## 2023-01-15 DIAGNOSIS — R351 Nocturia: Secondary | ICD-10-CM

## 2023-01-15 DIAGNOSIS — R635 Abnormal weight gain: Secondary | ICD-10-CM

## 2023-01-15 DIAGNOSIS — Z79891 Long term (current) use of opiate analgesic: Secondary | ICD-10-CM

## 2023-01-15 DIAGNOSIS — E669 Obesity, unspecified: Secondary | ICD-10-CM

## 2023-01-15 DIAGNOSIS — G478 Other sleep disorders: Secondary | ICD-10-CM

## 2023-01-16 ENCOUNTER — Telehealth: Payer: Self-pay | Admitting: *Deleted

## 2023-01-16 DIAGNOSIS — N400 Enlarged prostate without lower urinary tract symptoms: Secondary | ICD-10-CM | POA: Insufficient documentation

## 2023-01-16 DIAGNOSIS — G8929 Other chronic pain: Secondary | ICD-10-CM | POA: Insufficient documentation

## 2023-01-16 DIAGNOSIS — E785 Hyperlipidemia, unspecified: Secondary | ICD-10-CM | POA: Insufficient documentation

## 2023-01-16 DIAGNOSIS — R5383 Other fatigue: Secondary | ICD-10-CM | POA: Insufficient documentation

## 2023-01-16 DIAGNOSIS — Z01818 Encounter for other preprocedural examination: Secondary | ICD-10-CM | POA: Insufficient documentation

## 2023-01-16 NOTE — Assessment & Plan Note (Signed)
Check PSA with routine, fasting labs today

## 2023-01-16 NOTE — Assessment & Plan Note (Signed)
Check labs for further evaluation.

## 2023-01-16 NOTE — Assessment & Plan Note (Signed)
Stable. Continue nasocort daily

## 2023-01-16 NOTE — Assessment & Plan Note (Signed)
Surgical clearance today for total right hip replacement. Patient is surgically cleared with mild risk.

## 2023-01-16 NOTE — Assessment & Plan Note (Signed)
Ok to take oxycodone 15 mg as needed and as prescribed for severe pain. Single prescription for #15 tablets sent to his pharmacy

## 2023-01-16 NOTE — Assessment & Plan Note (Signed)
Stable.  Continue current medication

## 2023-01-16 NOTE — Assessment & Plan Note (Signed)
Check testosterone level with labs. Adjust testosterone level as indicated

## 2023-01-16 NOTE — Assessment & Plan Note (Signed)
Check fasting lipid panel with labs today

## 2023-01-16 NOTE — Assessment & Plan Note (Signed)
May continue to take ambien at bedtime as needed. New prescription sent to his pharmacy today

## 2023-01-16 NOTE — Telephone Encounter (Signed)
Pt calling to see if lab appointment is still needed and provider said no. Also pt wanted to be sure that his clearance form has been faxed over to provider ([place])at [facility].

## 2023-01-17 NOTE — Telephone Encounter (Signed)
Paperwork is completed ready to be faxed 01/17/2023

## 2023-01-18 NOTE — Procedures (Signed)
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 His Epworth sleepiness score is 4 out of 24, fatigue severity score is 47 out of 63. Height: 70 in Weight: 266 lb (BMI 38) Neck Size: 20 in   MEDICATIONS: Ziac, Celebrex, Temovate, Loratadine-D, Nitrostat, Oxycodone, Flomax, Depotestosterone Cypionate, Nasacort, Ambien  TECHNICAL DESCRIPTION: A registered sleep technologist  was in attendance for the duration of the recording.  Data collection, scoring, video monitoring, and reporting were performed in compliance with the AASM Manual for the Scoring of Sleep and Associated Events; (Hypopnea is scored based on the criteria listed in Section VIII D. 1b in the AASM Manual V2.6 using a 4% oxygen desaturation rule or Hypopnea is scored based on the criteria listed in Section VIII D. 1a in the AASM Manual V2.6 using 3% oxygen desaturation and /or arousal rule).   SLEEP CONTINUITY AND SLEEP ARCHITECTURE:  Lights-out was at 22:07: and lights-on at  02:44:, with  a total recording time of 4 hours, 36.5 min. Total sleep time ( TST) was 90.5 minutes with a decreased sleep efficiency at  32.7%.   BODY POSITION:  TST was divided  between the following sleep positions: 14.9% supine;  85.1% lateral;  0% prone. Duration of total sleep and percent of total sleep in their respective position is as follows: supine 13 minutes (15%), non-supine 77 minutes (85%); right 63 minutes (70%), left 13 minutes (15%), and prone 00 minutes (0%).  Total supine REM sleep time was 00 minutes (0% of total REM sleep).  Sleep latency was increased at 35.5 minutes.  REM sleep was absent.  Of the total sleep time, the percentage of stage N1 sleep was 23.2%, stage N2 sleep was 77%, stage N3 sleep was 0.0%, and REM sleep was 0.0%.  There were 0 Stage R periods observed on this study night, 20 awakenings (i.e. transitions to Stage W from any sleep stage), and 57 total stage transitions. Wake after sleep onset (WASO) time accounted for 150.5 minutes with significant sleep fragmentation noted.   RESPIRATORY MONITORING:   Based on CMS criteria (using a 4% oxygen desaturation rule for scoring hypopneas), there were 1 apneas (1 obstructive; 0 central; 0 mixed), and 30 hypopneas.  Apnea index was 0.7. Hypopnea index was 19.9. The apnea-hypopnea index was 20.6 overall (53.3 supine, 0 non-supine; 0.0 REM, 0.0 supine REM).  There were 0 respiratory effort-related arousals (RERAs).  The RERA index was 0 events/h. Total respiratory disturbance index (RDI) was 20.6 events/h. RDI results showed: supine RDI  53.3 /h; non-supine RDI 14.8 /h; REM RDI 0.0 /h, supine REM RDI 0.0 /h.   Based on AASM criteria (using a 3% oxygen  desaturation and /or arousal rule for scoring hypopneas), there were 1 apneas (1 obstructive; 0 central; 0 mixed), and 30 hypopneas. Apnea index was 0.7. Hypopnea index was 19.9. The apnea-hypopnea index was 20.6 overall (53.3 supine, 0 non-supine; 0.0 REM, 0.0 supine REM).  There were 0 respiratory effort-related arousals (RERAs).  The RERA index was 0 events/h. Total respiratory disturbance index (RDI) was 20.6  events/h. RDI results showed: supine RDI  53.3 /h; non-supine RDI 14.8 /h; REM RDI 0.0 /h, supine REM RDI 0.0 /h.   OXIMETRY: Oxyhemoglobin Saturation Nadir during sleep was at  84%) from a mean of 92%.  Of the Total sleep time (TST)   hypoxemia (=<88%) was present for  0.9 minutes, or 1.0% of total sleep time.   LIMB MOVEMENTS: There were 163 periodic limb movements of sleep (108.1/hr), of which 8 (5.3/hr) were  associated with an arousal.  AROUSAL: There were 79 arousals in total, for an arousal index of 52 arousals/hour.  Of these, 22 were identified as respiratory-related arousals (15 /h), 8 were PLM-related arousals (5 /h), and 58 were non-specific arousals (38 /h).  EEG:  Review of the EEG showed no abnormal electrical discharges and symmetrical bihemispheric findings.    EKG: The EKG revealed normal sinus rhythm (NSR). The average heart rate during sleep was 73 bpm.  AUDIO/VIDEO REVIEW: The audio and video review did not show any abnormal or unusual behaviors, movements, phonations or vocalizations. The patient took no bathroom breaks.  Of note, intermittent mild to moderate snoring was detected but the patient was very restless and reported pain particularly hip pain and reported that he forgot to bring his pain medication.  He requested to end the study early and signed an AMA form prior to leaving.  He indicated that he would like to have his sleep study done after he has undergone hip replacement surgery.  Study was incomplete with the less than 2 hours of sleep time.  POST-STUDY QUESTIONNAIRE: n/a, an AMA form was signed by the patient prior to leaving, and witnessed by the acquisition technologist.    IMPRESSION:  1. Inconclusive Test, poor sleep pattern  RECOMMENDATIONS:  1. This overnight sleep study was inconclusive secondary to poor sleep consolidation and achieving less than 2 hours of total sleep time, he achieved only stage N1 and N2 sleep, no REM sleep.  The patient will be  advised to proceed with a repeat sleep study either at home or in the sleep lab, when possible.  He indicated that he would like to repeat his sleep study after his hip replacement surgery.  In reviewing his chart, a date for his right hip replacement is pending.  He is also reported to be in need for a right knee replacement surgery.      I certify that I have reviewed the entire raw data recording prior to the issuance of this report in accordance with the Standards of Accreditation of the American Academy of Sleep Medicine (AASM).  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 The symptoms have been ongoing for years, they have worsened over the past 2 years or so. He has also gained about 50 pounds in the past couple of years due to inactivity, particularly related to right knee pain. Ziac, Celebrex, Temovate, Loratadine-D, Nitrostat, Oxycodone, Flomax, Depotestosterone Cypionate, Nasacort, Ambien   Sleep Disorder      Comments   The patient came into the lab for a PSG. The patient took Ambien prior to start of study. Later in the night the  patient took Ray. The patient had no restroom breaks. EKG kept in NSR. Mild to moderate snoring. Respiratory events scored with a 4% desat. Slept lateral and supine. The patient had a lot of WASO he complained of hip pain. The patient forgot to bring his pain medication. He is planning to have hip replacement surgery. PLM's noted. No REM periods. The patient never reached 2 hrs of TST. He asked to end the study early. The patient did sign an AMA form. He said he was just in too much pain to be able to go back to sleep. Per the patient he would like to retry the sleep study after he has hip replacement surgery.     Lights out: 10:07:19 PM Lights on: 02:44:05 AM   Time Total Supine Side Prone Upright  Recording (TRT) 4h 36.66m 1h 53.79m 2h 43.25m 0h 0.11m 0h 0.24m  Sleep (TST) 1h 30.62m 0h 13.65m 1h 17.54m 0h 0.46m 0h 0.63m   Latency N1 N2 N3 REM Onset Per. Slp. Eff.  Actual 0h 0.20m 0h 1.3m 0h 0.33m 0h 0.29m 0h 35.71m 1h 15.50m 32.73%   Stg Dur Wake N1 N2 N3 REM  Total 186.0 21.0 69.5 0.0 0.0  Supine 100.0 5.5 8.0 0.0 0.0  Side 86.0 15.5 61.5 0.0 0.0  Prone 0.0 0.0 0.0 0.0 0.0  Upright 0.0 0.0 0.0 0.0 0.0   Stg % Wake N1 N2 N3 REM  Total 67.3 23.2 76.8 0.0 0.0  Supine 36.2 6.1 8.8 0.0 0.0  Side 31.1 17.1 68.0 0.0 0.0  Prone 0.0 0.0 0.0 0.0 0.0  Upright 0.0 0.0 0.0 0.0 0.0     Apnea Summary Sub Supine Side Prone Upright  Total 1 Total 1 1 0 0 0    REM 0 0 0 0 0    NREM 1 1 0 0 0  Obs 1 REM 0 0 0 0 0    NREM 1 1 0 0 0  Mix 0 REM 0 0 0 0 0    NREM 0 0 0 0 0  Cen 0 REM 0 0 0 0 0    NREM 0 0 0 0 0   Rera Summary Sub Supine Side Prone Upright  Total 0 Total 0 0 0 0 0    REM 0 0 0 0 0    NREM 0 0 0 0 0   Hypopnea Summary Sub Supine Side Prone Upright  Total 30 Total 30 11 19  0 0    REM 0 0 0 0 0    NREM 30 11 19  0 0   4% Hypopnea Summary Sub Supine Side Prone Upright  Total (4%) 30 Total  30 11 19  0 0    REM 0 0 0 0 0    NREM 30 11 19  0 0     AHI Total Obs Mix Cen  20.55 Apnea 0.66 0.66  0.00 0.00   Hypopnea 19.89 -- -- --  20.55 Hypopnea (4%) 19.89 -- -- --    Total Supine Side Prone Upright  Position AHI 20.55 53.33 14.81 0.00 0.00  REM AHI 0.00   NREM AHI 20.55   Position RDI 20.55 53.33 14.81 0.00 0.00  REM RDI 0.00   NREM RDI 20.55    4% Hypopnea Total Supine Side Prone Upright  Position AHI (4%) 20.55 53.33 14.81 0.00 0.00  REM AHI (4%) 0.00   NREM AHI (4%) 20.55   Position RDI (4%) 20.55 53.33 14.81 0.00 0.00  REM RDI (4%) 0.00   NREM RDI (4%) 20.55    Desaturation Information Threshold: 2% <100% <90% <80% <70% <60% <50% <40%  Supine 86.0 16.0 1.0 0.0 0.0 0.0 0.0  Side 121.0 19.0 0.0 0.0 0.0 0.0 0.0  Prone 0.0 0.0 0.0 0.0 0.0 0.0 0.0  Upright 0.0 0.0 0.0 0.0 0.0 0.0 0.0  Total 207.0 35.0 1.0 0.0 0.0 0.0 0.0  Index 70.8 12.0 0.3 0.0 0.0 0.0 0.0   Threshold: 3% <100% <90% <80% <70% <60% <50% <40%  Supine 56.0 16.0 1.0 0.0 0.0 0.0 0.0  Side 65.0 18.0 0.0 0.0 0.0 0.0 0.0  Prone 0.0 0.0 0.0 0.0 0.0 0.0 0.0  Upright 0.0 0.0 0.0 0.0 0.0 0.0 0.0  Total 121.0 34.0 1.0 0.0 0.0 0.0 0.0  Index 41.4 11.6 0.3 0.0 0.0 0.0 0.0   Threshold: 4% <100% <90% <80% <70% <60% <50% <40%  Supine 41.0 16.0 1.0 0.0 0.0 0.0 0.0  Side 51.0 18.0 0.0 0.0 0.0 0.0 0.0  Prone 0.0 0.0 0.0 0.0 0.0 0.0 0.0  Upright 0.0 0.0 0.0 0.0 0.0 0.0 0.0  Total 92.0 34.0 1.0 0.0 0.0 0.0 0.0  Index 31.5 11.6 0.3 0.0 0.0 0.0 0.0   Threshold: 3% <100% <90% <80% <70% <60% <50% <40%  Supine 56 16 1 0 0 0 0  Side 65 18 0 0 0 0 0  Prone 0 0 0 0 0 0 0  Upright 0 0 0 0 0 0 0  Total 121 34 1 0 0 0 0   Awakening/Arousal Information # of Awakenings 20  Wake after sleep onset 150.3m  Wake after persistent sleep 121.51m   Arousal Assoc. Arousals Index  Apneas 1 0.7  Hypopneas 21 13.9  Leg Movements 8 5.3  Snore 0 0.0  PTT Arousals 0 0.0  Spontaneous 58 38.5  Total 88 58.3  Leg Movement Information PLMS LMs Index  Total LMs during PLMS 163 108.1  LMs w/ Microarousals 8 5.3   LM LMs Index   w/ Microarousal 0 0.0  w/ Awakening 0 0.0  w/ Resp Event 2 1.3  Spontaneous 3 2.0  Total 5 3.3     Desaturation threshold setting: 3% Minimum desaturation setting: 10 seconds SaO2 nadir: 76% The longest event was a 24 sec obstructive Hypopnea with a minimum SaO2 of 91%. The lowest SaO2 was 86% associated with a 18 sec obstructive Hypopnea. EKG Rates EKG Avg Max Min  Awake 73 114 61  Asleep 73 82 66  EKG Events: Tachycardia

## 2023-01-19 ENCOUNTER — Telehealth: Payer: Self-pay | Admitting: *Deleted

## 2023-01-19 NOTE — Telephone Encounter (Signed)
Noted, thank you

## 2023-01-19 NOTE — Telephone Encounter (Signed)
Spoke with patient and discussed that his sleep study was inconclusive.  Patient got very little sleep had severe hip pain and left the sleep study without completing.  He said he was not able to sleep and did not want to take another Ambien and then be drowsy driving home. I did give him the option of the home sleep test which she found interesting but he still prefers to wait until after surgery to be tested again. He stated he would actually need to have a knee replacement as soon as he heals from his hip replacement.  He was very appreciative for the call and said he had our office information to call us when he is ready.

## 2023-01-19 NOTE — Telephone Encounter (Signed)
-----   Message from Star Age, MD sent at 01/18/2023  6:10 PM EDT ----- Sleep study was inconclusive. Patient signed out AMA; had very little sleep and severe hip pain. He indicated, he would like to defer sleep study till after hip surgery, but date is pending. Please call patient and advise him that we can pursue a home sleep test, if he would like, whenever he feels ready. I will put order in, when patient is agreeable to be testing again. He can call us or email [place] in [place].

## 2023-01-24 ENCOUNTER — Other Ambulatory Visit: Payer: No Typology Code available for payment source

## 2023-01-30 ENCOUNTER — Ambulatory Visit (INDEPENDENT_AMBULATORY_CARE_PROVIDER_SITE_OTHER): Payer: No Typology Code available for payment source | Admitting: Nurse Practitioner

## 2023-01-30 ENCOUNTER — Encounter: Payer: Self-pay | Admitting: Nurse Practitioner

## 2023-01-30 VITALS — BP 105/67 | HR 65 | Ht 69.5 in | Wt 253.8 lb

## 2023-01-30 DIAGNOSIS — M25551 Pain in right hip: Secondary | ICD-10-CM

## 2023-01-30 DIAGNOSIS — E785 Hyperlipidemia, unspecified: Secondary | ICD-10-CM

## 2023-01-30 DIAGNOSIS — R7989 Other specified abnormal findings of blood chemistry: Secondary | ICD-10-CM | POA: Diagnosis not present

## 2023-01-30 DIAGNOSIS — Z0000 Encounter for general adult medical examination without abnormal findings: Secondary | ICD-10-CM

## 2023-01-30 DIAGNOSIS — I10 Essential (primary) hypertension: Secondary | ICD-10-CM | POA: Diagnosis not present

## 2023-01-30 DIAGNOSIS — G8929 Other chronic pain: Secondary | ICD-10-CM

## 2023-01-30 NOTE — Progress Notes (Signed)
Established patient visit   Patient: Ronald Parsons   DOB: 1956/10/06   67 y.o. Male  MRN: [id number] Visit Date: 01/30/2023   Chief Complaint  Patient presents with   Medical Management of Chronic Issues   Subjective    HPI  Hip pain - due to have right hip replacement on this coming Friday.  -hs hypertension  -well managed  -low testosterone  Recently had routine/ fasting labs.  -mild elevation of LDL with mildly low HDL. The remainder of lipid panel was normal.  -testosterone high. Depends on when last testosterone injection was.  -improved blood count  -glucose is 101  -other labs normal .   Medications: Outpatient Medications Prior to Visit  Medication Sig   bisoprolol-hydrochlorothiazide (ZIAC) 2.5-6.25 MG tablet TAKE 1/2 TO 1 TABLET BY MOUTH ONCE DAILY   celecoxib (CELEBREX) 200 MG capsule Take 1 capsule (200 mg total) by mouth 2 (two) times daily as needed for mild pain or moderate pain.   clobetasol ointment (TEMOVATE) 0.05 % APPLY TO AFFECTED AREA ON THE SKIN 2 TIMES PER DAY FOR 10 DAYS   NON FORMULARY balance of nature   oxyCODONE (ROXICODONE) 15 MG immediate release tablet TAKE 1/2 TABLET BY MOUTH ONCE DAILY AS NEEDED   SYRINGE-NEEDLE, DISP, 3 ML (B-D 3CC LUER-LOK SYR 22GX1-1/2) 22G X 1-1/2" 3 ML MISC To use with testosterone injections every 3 weeks   tamsulosin (FLOMAX) 0.4 MG CAPS capsule Take 1 capsule (0.4 mg total) by mouth daily.   testosterone cypionate (DEPOTESTOSTERONE CYPIONATE) 200 MG/ML injection INJECT 0.5 ML INTO THE MUSCLE EVERY 21 DAYS.   triamcinolone (NASACORT) 55 MCG/ACT AERO nasal inhaler Place 2 sprays into the nose daily.   zolpidem (AMBIEN) 10 MG tablet Take 1 tablet (10 mg total) by mouth at bedtime.   [DISCONTINUED] loratadine-pseudoephedrine (LORATADINE-D 12HR) 5-120 MG tablet Take 1 tablet by mouth 2 (two) times daily.   nitroGLYCERIN (NITROSTAT) 0.4 MG SL tablet Place 1 tablet (0.4 mg total) under the tongue every 5 (five) minutes as  needed for chest pain. If you require more than two tablets five minutes apart go to the nearest ER via EMS.   No facility-administered medications prior to visit.    Review of Systems  Last CBC Lab Results  Component Value Date   WBC 9.5 01/13/2023   HGB 18.9 (H) 01/13/2023   HCT 54.7 (H) 01/13/2023   MCV 91 01/13/2023   MCH 31.4 01/13/2023   RDW 14.5 01/13/2023   PLT 196 01/13/2023   Last metabolic panel Lab Results  Component Value Date   GLUCOSE 101 (H) 01/13/2023   NA 139 01/13/2023   K 4.2 01/13/2023   CL 101 01/13/2023   CO2 23 01/13/2023   BUN 14 01/13/2023   CREATININE 1.00 01/13/2023   EGFR 83 01/13/2023   CALCIUM 9.8 01/13/2023   PROT 6.7 01/13/2023   ALBUMIN 4.3 01/13/2023   LABGLOB 2.4 01/13/2023   AGRATIO 1.8 01/13/2023   BILITOT 0.5 01/13/2023   ALKPHOS 78 01/13/2023   AST 30 01/13/2023   ALT 44 01/13/2023   ANIONGAP 10 06/09/2020   Last lipids Lab Results  Component Value Date   CHOL 166 01/13/2023   HDL 37 (L) 01/13/2023   LDLCALC 106 (H) 01/13/2023   TRIG 129 01/13/2023   CHOLHDL 4.5 01/13/2023   Last hemoglobin A1c Lab Results  Component Value Date   HGBA1C 5.4 09/22/2022   Last thyroid functions Lab Results  Component Value Date   TSH 1.780 01/13/2023  Objective     Today's Vitals   01/30/23 1045  BP: 105/67  Pulse: 65  SpO2: 95%  Weight: 253 lb 12.8 oz (115.1 kg)  Height: 5' 9.5" (1.765 m)   Body mass index is 36.94 kg/m.  BP Readings from Last 3 Encounters:  01/30/23 105/67  01/13/23 109/81  11/07/22 126/80    Wt Readings from Last 3 Encounters:  01/30/23 253 lb 12.8 oz (115.1 kg)  01/13/23 261 lb 6.4 oz (118.6 kg)  11/07/22 266 lb 12.8 oz (121 kg)    Physical Exam Vitals and nursing note reviewed.  Constitutional:      Appearance: Normal appearance. He is well-developed.  HENT:     Head: Normocephalic and atraumatic.     Nose: Nose normal.     Mouth/Throat:     Mouth: Mucous membranes are moist.      Pharynx: Oropharynx is clear.  Eyes:     Extraocular Movements: Extraocular movements intact.     Conjunctiva/sclera: Conjunctivae normal.     Pupils: Pupils are equal, round, and reactive to light.  Neck:     Vascular: No carotid bruit.  Cardiovascular:     Rate and Rhythm: Normal rate and regular rhythm.     Pulses: Normal pulses.     Heart sounds: Normal heart sounds.  Pulmonary:     Effort: Pulmonary effort is normal.     Breath sounds: Normal breath sounds.  Abdominal:     Palpations: Abdomen is soft.  Musculoskeletal:     Cervical back: Normal range of motion and neck supple.     Right hip: Bony tenderness present. Decreased range of motion. Decreased strength.     Comments: Patient walking with moderate to severe limp favoring the right side   Lymphadenopathy:     Cervical: No cervical adenopathy.  Skin:    General: Skin is warm and dry.     Capillary Refill: Capillary refill takes less than 2 seconds.  Neurological:     General: No focal deficit present.     Mental Status: He is alert and oriented to person, place, and time.  Psychiatric:        Mood and Affect: Mood normal.        Behavior: Behavior normal.        Thought Content: Thought content normal.        Judgment: Judgment normal.      Assessment & Plan    Essential hypertension Assessment & Plan: Stable. Continue current medication.    Chronic right hip pain Assessment & Plan: The patient is scheduled to have right hip replacement surgery oh Friday.   Dyslipidemia, goal LDL below 100 Assessment & Plan: Mildly elevated LDL  -Recommend he limit intake of fried and fatty foods. He should increase intake of lean proteins and green leafy vegetables.      Low testosterone in male Assessment & Plan: Check testosterone level with labs. Adjust testosterone level as indicated       Return in about 4 months (around 06/01/2023) for blood pressure, testosterone. , FBW a week prior to visit - MWV  after that .         Carlean Jews, NP  [facility] at [facility] [phone] (phone) [phone] (fax)  [facility]

## 2023-01-30 NOTE — Progress Notes (Deleted)
Subjective:   Ronald Parsons is a 67 y.o. male who presents for Medicare Annual/Subsequent preventive examination.  Review of Systems          Objective:    There were no vitals filed for this visit. There is no height or weight on file to calculate BMI.     06/09/2020    9:40 AM 07/30/2013    2:30 PM 07/26/2013   12:32 PM  Advanced Directives  Does Patient Have a Medical Advance Directive? No Patient does not have advance directive;Patient would not like information Patient does not have advance directive;Patient would like information  Would patient like information on creating a medical advance directive? No - Patient declined  Advance directive packet given    Current Medications (verified) Outpatient Encounter Medications as of 01/30/2023  Medication Sig   bisoprolol-hydrochlorothiazide (ZIAC) 2.5-6.25 MG tablet TAKE 1/2 TO 1 TABLET BY MOUTH ONCE DAILY   celecoxib (CELEBREX) 200 MG capsule Take 1 capsule (200 mg total) by mouth 2 (two) times daily as needed for mild pain or moderate pain.   clobetasol ointment (TEMOVATE) 0.05 % APPLY TO AFFECTED AREA ON THE SKIN 2 TIMES PER DAY FOR 10 DAYS   loratadine-pseudoephedrine (LORATADINE-D 12HR) 5-120 MG tablet Take 1 tablet by mouth 2 (two) times daily.   nitroGLYCERIN (NITROSTAT) 0.4 MG SL tablet Place 1 tablet (0.4 mg total) under the tongue every 5 (five) minutes as needed for chest pain. If you require more than two tablets five minutes apart go to the nearest ER via EMS.   NON FORMULARY balance of nature   oxyCODONE (ROXICODONE) 15 MG immediate release tablet TAKE 1/2 TABLET BY MOUTH ONCE DAILY AS NEEDED   SYRINGE-NEEDLE, DISP, 3 ML (B-D 3CC LUER-LOK SYR 22GX1-1/2) 22G X 1-1/2" 3 ML MISC To use with testosterone injections every 3 weeks   tamsulosin (FLOMAX) 0.4 MG CAPS capsule Take 1 capsule (0.4 mg total) by mouth daily.   testosterone cypionate (DEPOTESTOSTERONE CYPIONATE) 200 MG/ML injection INJECT 0.5 ML INTO THE MUSCLE  EVERY 21 DAYS.   triamcinolone (NASACORT) 55 MCG/ACT AERO nasal inhaler Place 2 sprays into the nose daily.   zolpidem (AMBIEN) 10 MG tablet Take 1 tablet (10 mg total) by mouth at bedtime.   No facility-administered encounter medications on file as of 01/30/2023.    Allergies (verified) Hydromorphone hcl, Penicillins, and Lovastatin   History: Past Medical History:  Diagnosis Date   Asthma    allergy related   Cervical radiculitis    Depression    DISH (diffuse idiopathic skeletal hyperostosis)    Gout    Hyperlipidemia    Hypertension    Lumbago    Osteoarthritis    Past Surgical History:  Procedure Laterality Date   BACK SURGERY  10/24/1989   laminectomy   CARDIAC CATHETERIZATION     5-7 years ago, states it was clear   COLONOSCOPY     HERNIA REPAIR     inguinal hernia repair as a baby on right groin   KNEE SURGERY Right    2023   RIGHT/LEFT HEART CATH AND CORONARY ANGIOGRAPHY N/A 06/09/2020   Procedure: RIGHT/LEFT HEART CATH AND CORONARY ANGIOGRAPHY;  Surgeon: Ronald Parsons, Jay, MD;  Location: [facility] INV[facility]IVE [facility];  Service: Cardiovascular;  Laterality: N/A;   SHOULDER SURGERY Bilateral    2017   Family History  Problem Relation Age of Onset   Heart disease Father    Hypertension Father    Breast cancer Sister    Valvular heart  disease Maternal Grandfather    Emphysema Paternal Grandfather    Social History   Socioeconomic History   Marital status: Married    Spouse name: Not on file   Number of children: Not on file   Years of education: Not on file   Highest education level: Not on file  Occupational History   Not on file  Tobacco Use   Smoking status: Former   Smokeless tobacco: Current    Types: Chew  Vaping Use   Vaping Use: Never used  Substance and Sexual Activity   Alcohol use: Yes    Comment: once monthly   Drug use: No   Sexual activity: Not Currently  Other Topics Concern   Not on file  Social History Narrative   Lives at home with wife.   Retired Youth worker. Caffiene iced tea 1 glass daily.     Social Determinants of Health   Financial Resource Strain: Not on file  Food Insecurity: Not on file  Transportation Needs: Not on file  Physical Activity: Not on file  Stress: Not on file  Social Connections: Not on file    Tobacco Counseling Ready to quit: Not Answered Counseling given: Not Answered   Clinical Intake:                 Diabetic?***         Activities of Daily Living    02/14/2022   10:51 AM  In your present state of health, do you have any difficulty performing the following activities:  Hearing? 0  Vision? 1  Difficulty concentrating or making decisions? 1  Walking or climbing stairs? 1  Dressing or bathing? 0  Doing errands, shopping? 0    Patient Care Team: Ronald Jews, NP as PCP - General (Family Medicine)  Indicate any recent Medical Services you may have received from other than [facility] providers in the past year (date may be approximate).     Assessment:   This is a routine wellness examination for Ronald Parsons.  Hearing/Vision screen No results found.  Dietary issues and exercise activities discussed:     Goals Addressed   None   Depression Screen    01/13/2023   11:15 AM 09/29/2022   10:41 AM 05/31/2022    2:51 PM 02/14/2022   10:50 AM 11/15/2021   10:21 AM  PHQ 2/9 Scores  PHQ - 2 Score 4 2 2 2 2   PHQ- 9 Score 12 7 7 5 7     Fall Risk    01/13/2023   11:15 AM 11/15/2021   10:21 AM  Fall Risk   Falls in the past year? 0 0  Number falls in past yr: 0 0  Injury with Fall? 0 0  Follow up Falls evaluation completed     FALL RISK PREVENTION PERTAINING TO THE HOME:  Any stairs in or around the home? {YES/NO:21197} If so, are there any without handrails? {YES/NO:21197} Home free of loose throw rugs in walkways, pet beds, electrical cords, etc? {YES/NO:21197} Adequate lighting in your home to reduce risk of falls? {YES/NO:21197}  ASSISTIVE DEVICES  UTILIZED TO PREVENT FALLS:  Life alert? {YES/NO:21197} Use of a cane, walker or w/c? {YES/NO:21197} Grab bars in the bathroom? {YES/NO:21197} Shower chair or bench in shower? {YES/NO:21197} Elevated toilet seat or a handicapped toilet? {YES/NO:21197}  TIMED UP AND GO:  Was the test performed? {YES/NO:21197}.  Length of time to ambulate 10 feet: *** sec.   {Appearance of TGYB:6389373}  Cognitive Function:  Immunizations Immunization History  Administered Date(s) Administered   Influenza,inj,Quad PF,6+ Mos 07/31/2013   Influenza,inj,quad, With Preservative 07/24/2018   Zoster Recombinat (Shingrix) 07/09/2018, 10/02/2018    {TDAP status:2101805}  {Flu Vaccine status:2101806}  {Pneumococcal vaccine status:2101807}  {Covid-19 vaccine status:2101808}  Qualifies for Shingles Vaccine? {YES/NO:21197}  Zostavax completed {YES/NO:21197}  {Shingrix Completed?:2101804}  Screening Tests Health Maintenance  Topic Date Due   COVID-19 Vaccine (1) Never done   Hepatitis C Screening  Never done   DTaP/Tdap/Td (1 - Tdap) Never done   Pneumonia Vaccine 64+ Years old (1 of 1 - PCV) Never done   INFLUENZA VACCINE  05/25/2023   Medicare Annual Wellness (AWV)  01/30/2024   COLONOSCOPY (Pts 45-42yrs Insurance coverage will need to be confirmed)  04/08/2030   Zoster Vaccines- Shingrix  Completed   HPV VACCINES  Aged Out    Health Maintenance  Health Maintenance Due  Topic Date Due   COVID-19 Vaccine (1) Never done   Hepatitis C Screening  Never done   DTaP/Tdap/Td (1 - Tdap) Never done   Pneumonia Vaccine 41+ Years old (1 of 1 - PCV) Never done    {Colorectal cancer screening:2101809}  Lung Cancer Screening: (Low Dose CT Chest recommended if Age 42-80 years, 30 pack-year currently smoking OR have quit w/in 15years.) {DOES NOT does:27190::"does not"} qualify.   Lung Cancer Screening Referral: ***  Additional Screening:  Hepatitis C Screening: {DOES NOT  does:27190::"does not"} qualify; Completed ***  Vision Screening: Recommended annual ophthalmology exams for early detection of glaucoma and other disorders of the eye. Is the patient up to date with their annual eye exam?  {YES/NO:21197} Who is the provider or what is the name of the office in which the patient attends annual eye exams? *** If pt is not established with a provider, would they like to be referred to a provider to establish care? {YES/NO:21197}.   Dental Screening: Recommended annual dental exams for proper oral hygiene  Community Resource Referral / Chronic Care Management: CRR required this visit?  {YES/NO:21197}  CCM required this visit?  {YES/NO:21197}     Plan:     I have personally reviewed and noted the following in the patient's chart:   Medical and social history Use of alcohol, tobacco or illicit drugs  Current medications and supplements including opioid prescriptions. {Opioid Prescriptions:[id number]} Functional ability and status Nutritional status Physical activity Advanced directives List of other physicians Hospitalizations, surgeries, and ER visits in previous 12 months Vitals Screenings to include cognitive, depression, and falls Referrals and appointments  In addition, I have reviewed and discussed with patient certain preventive protocols, quality metrics, and best practice recommendations. A written personalized care plan for preventive services as well as general preventive health recommendations were provided to patient. I connected with Ronald Parsons on 01/30/23 by audio only telemedicine application and verified that I am speaking with the correct person using two identifiers.   I discussed the limitations, risks, security and privacy concerns of performing an evaluation and management service in person and the availability of in person appointments. I also discussed with the patient responsible charge related to this service. The  patient expressed understanding and verbally consented to this office visit.   Locations of Patient:in office  Location of Provider:in office  List any persons and their role that are participating in this visit with patient:     Thad Ranger, [place]   01/30/2023   Nurse Notes:

## 2023-03-02 ENCOUNTER — Other Ambulatory Visit: Payer: Self-pay | Admitting: Nurse Practitioner

## 2023-03-05 NOTE — Assessment & Plan Note (Signed)
The patient is scheduled to have right hip replacement surgery oh Friday.

## 2023-03-05 NOTE — Assessment & Plan Note (Signed)
Mildly elevated LDL  -Recommend he limit intake of fried and fatty foods. He should increase intake of lean proteins and green leafy vegetables.

## 2023-03-05 NOTE — Assessment & Plan Note (Signed)
Stable.  Continue current medication

## 2023-03-05 NOTE — Assessment & Plan Note (Signed)
Check testosterone level with labs. Adjust testosterone level as indicated  

## 2023-03-10 ENCOUNTER — Other Ambulatory Visit: Payer: Self-pay | Admitting: Nurse Practitioner

## 2023-03-23 NOTE — Addendum Note (Signed)
Addended by: Vincent Gros on: 03/23/2023 06:29 AM   Modules accepted: Level of Service

## 2023-04-04 ENCOUNTER — Other Ambulatory Visit: Payer: Self-pay | Admitting: Nurse Practitioner

## 2023-04-04 ENCOUNTER — Telehealth: Payer: Self-pay | Admitting: Nurse Practitioner

## 2023-04-05 ENCOUNTER — Emergency Department (HOSPITAL_COMMUNITY)
Admission: EM | Admit: 2023-04-05 | Discharge: 2023-04-05 | Disposition: A | Discharge status: Home / Self Care | Payer: Medicare Other | Attending: Emergency Medicine | Admitting: Emergency Medicine

## 2023-04-05 ENCOUNTER — Emergency Department (HOSPITAL_COMMUNITY): Payer: Medicare Other

## 2023-04-05 ENCOUNTER — Other Ambulatory Visit: Payer: Self-pay

## 2023-04-05 DIAGNOSIS — Y792 Prosthetic and other implants, materials and accessory orthopedic devices associated with adverse incidents: Secondary | ICD-10-CM | POA: Diagnosis not present

## 2023-04-05 DIAGNOSIS — T84020A Dislocation of internal right hip prosthesis, initial encounter: Secondary | ICD-10-CM | POA: Insufficient documentation

## 2023-04-05 DIAGNOSIS — Z96641 Presence of right artificial hip joint: Secondary | ICD-10-CM | POA: Diagnosis not present

## 2023-04-05 DIAGNOSIS — I10 Essential (primary) hypertension: Secondary | ICD-10-CM | POA: Insufficient documentation

## 2023-04-05 DIAGNOSIS — S73004A Unspecified dislocation of right hip, initial encounter: Secondary | ICD-10-CM

## 2023-04-05 DIAGNOSIS — M25551 Pain in right hip: Secondary | ICD-10-CM | POA: Diagnosis present

## 2023-04-05 MED ORDER — SODIUM CHLORIDE 0.9 % IV BOLUS
500.0000 mL | Freq: Once | INTRAVENOUS | Status: AC
  Administered 2023-04-05: 500 mL via INTRAVENOUS

## 2023-04-05 MED ORDER — KETAMINE HCL 10 MG/ML IJ SOLN
INTRAMUSCULAR | Status: AC | PRN
  Administered 2023-04-05: 20 mg via INTRAVENOUS

## 2023-04-05 MED ORDER — PROPOFOL 10 MG/ML IV BOLUS
0.5000 mg/kg | Freq: Once | INTRAVENOUS | Status: DC
  Filled 2023-04-05: qty 20

## 2023-04-05 MED ORDER — MORPHINE SULFATE (PF) 4 MG/ML IV SOLN
8.0000 mg | Freq: Once | INTRAVENOUS | Status: AC
  Administered 2023-04-05: 8 mg via INTRAVENOUS
  Filled 2023-04-05: qty 2

## 2023-04-05 MED ORDER — ONDANSETRON HCL 4 MG/2ML IJ SOLN
4.0000 mg | Freq: Once | INTRAMUSCULAR | Status: AC
  Administered 2023-04-05: 4 mg via INTRAVENOUS
  Filled 2023-04-05: qty 2

## 2023-04-05 MED ORDER — OXYCODONE-ACETAMINOPHEN 5-325 MG PO TABS: 1.0000 | ORAL_TABLET | Freq: Four times a day (QID) | ORAL | 0 refills | Status: DC | PRN

## 2023-04-05 MED ORDER — KETAMINE HCL 10 MG/ML IJ SOLN
INTRAMUSCULAR | Status: AC
  Filled 2023-04-05: qty 1

## 2023-04-05 MED ORDER — PROPOFOL 10 MG/ML IV BOLUS
INTRAVENOUS | Status: AC | PRN
  Administered 2023-04-05: 50 mg via INTRAVENOUS
  Administered 2023-04-05 (×2): 25 mg via INTRAVENOUS
  Administered 2023-04-05: 50 mg via INTRAVENOUS
  Administered 2023-04-05 (×2): 25 mg via INTRAVENOUS
  Administered 2023-04-05 (×2): 50 mg via INTRAVENOUS

## 2023-04-05 MED ORDER — PROPOFOL 10 MG/ML IV BOLUS
INTRAVENOUS | Status: AC
  Filled 2023-04-05: qty 20

## 2023-04-05 NOTE — Discharge Instructions (Addendum)
Wear the knee immobilizer at all times except when showering until you see Dr. Linna Caprice.  Use the pain medication if you need.

## 2023-04-05 NOTE — ED Provider Notes (Signed)
.  Sedation  Date/Time: 04/05/2023 8:52 PM  Performed by: Glyn Ade, MD Authorized by: Glyn Ade, MD   Consent:    Consent obtained:  Verbal and written   Consent given by:  Patient   Risks discussed:  Allergic reaction, dysrhythmia, inadequate sedation, nausea, vomiting, respiratory compromise necessitating ventilatory assistance and intubation, prolonged sedation necessitating reversal and prolonged hypoxia resulting in organ damage   Alternatives discussed:  Analgesia without sedation Universal protocol:    Immediately prior to procedure, a time out was called: yes   Pre-sedation assessment:    Time since last food or drink:  8 hours   ASA classification: class 2 - patient with mild systemic disease     Mallampati score:  II - soft palate, uvula, fauces visible   Pre-sedation assessments completed and reviewed: airway patency, cardiovascular function, hydration status, mental status, nausea/vomiting, pain level, respiratory function and temperature   Immediate pre-procedure details:    Reassessment: Patient reassessed immediately prior to procedure     Reviewed: vital signs   Procedure details (see MAR for exact dosages):    Preoxygenation:  Nasal cannula   Sedation:  Ketamine and propofol   Intended level of sedation: deep   Intra-procedure monitoring:  Blood pressure monitoring, continuous capnometry, frequent LOC assessments, frequent vital sign checks, continuous pulse oximetry and cardiac monitor   Total Provider sedation time (minutes):  30 Post-procedure details:    Post-sedation assessment completed:  04/05/2023 8:52 PM   Attendance: Constant attendance by certified staff until patient recovered     Recovery: Patient returned to pre-procedure baseline     Patient is stable for discharge or admission: yes     Procedure completion:  Tolerated well, no immediate complications     Glyn Ade, MD 04/05/23 2053

## 2023-04-05 NOTE — Telephone Encounter (Signed)
Pt is asking for a cream instead of the ointment. Pt states that he is having scalp issues with eczema.

## 2023-04-05 NOTE — ED Triage Notes (Signed)
Pt BIBA. Pt c/o hip pain. Pt was stepping back and felt hip pop out. Pt has shortening and rotation of R leg. 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

## 2023-04-05 NOTE — ED Provider Notes (Signed)
[place] EMERGENCY DEPARTMENT AT [facility] Provider Note   CSN: [id number] Arrival date & time: 04/05/23  1652     History  Chief Complaint  Patient presents with   Hip Injury    Ronald Parsons is a 67 y.o. male.  Patient is a 67 year old male with a history of hypertension, hyperlipidemia with recent hip replacement 6 weeks ago who is presenting today with severe right hip pain after feeling a pop today.  He reports that he recently saw Dr. Linna Caprice who is the surgeon who did his surgery and he was cleared.  Today he was pulling a 40 pound plastic object with a handle across the concrete floor and he reached back and was holding onto his truck and stepped in lean back and pulled and all of a sudden felt a pop with severe pain in his right hip.  He did not fall to the ground or injure himself otherwise.  He has no to numbness or tingling in his foot but is unable to move his leg or walk because the pain is too severe.  The history is provided by the patient and medical records.       Home Medications Prior to Admission medications   Medication Sig Start Date End Date Taking? Authorizing Provider  bisoprolol-hydrochlorothiazide (ZIAC) 2.5-6.25 MG tablet TAKE 1/2 TO 1 TABLET BY MOUTH ONCE DAILY 01/13/23   Carlean Jews, NP  celecoxib (CELEBREX) 200 MG capsule Take 1 capsule (200 mg total) by mouth 2 (two) times daily as needed for mild pain or moderate pain. 01/13/23   Carlean Jews, NP  clobetasol ointment (TEMOVATE) 0.05 % APPLY TO AFFECTED AREA ON THE SKIN 2 TIMES PER DAY FOR 10 DAYS 09/29/22   Boscia, Heather E, NP  LORATADINE-D 12HR 5-120 MG tablet TAKE 1 TABLET BY MOUTH 2 TIMES DAILY. 03/02/23   Carlean Jews, NP  nitroGLYCERIN (NITROSTAT) 0.4 MG SL tablet Place 1 tablet (0.4 mg total) under the tongue every 5 (five) minutes as needed for chest pain. If you require more than two tablets five minutes apart go to the nearest ER via EMS. 05/12/20 11/07/22  Tessa Lerner, DO  NON FORMULARY balance of nature    [provider]  oxyCODONE (ROXICODONE) 15 MG immediate release tablet TAKE 1/2 TABLET BY MOUTH ONCE DAILY AS NEEDED 01/13/23   Carlean Jews, NP  oxyCODONE-acetaminophen (PERCOCET/ROXICET) 5-325 MG tablet Take 1 tablet by mouth every 6 (six) hours as needed for severe pain. 04/05/23  Yes Jese Comella, MD  SYRINGE-NEEDLE, DISP, 3 ML (B-D 3CC LUER-LOK SYR 22GX1-1/2) 22G X 1-1/2" 3 ML MISC To use with testosterone injections every 3 weeks 01/13/23   Carlean Jews, NP  tamsulosin (FLOMAX) 0.4 MG CAPS capsule Take 1 capsule (0.4 mg total) by mouth daily. 01/13/23   Carlean Jews, NP  testosterone cypionate (DEPOTESTOSTERONE CYPIONATE) 200 MG/ML injection INJECT 0.5 ML INTO THE MUSCLE EVERY 21 DAYS. 01/13/23   Carlean Jews, NP  triamcinolone (NASACORT) 55 MCG/ACT AERO nasal inhaler Place 2 sprays into the nose daily. 01/13/23   Carlean Jews, NP  zolpidem (AMBIEN) 10 MG tablet Take 1 tablet (10 mg total) by mouth at bedtime. 01/13/23   Carlean Jews, NP      Allergies    Hydromorphone hcl, Penicillins, and Lovastatin    Review of Systems   Review of Systems  Physical Exam Updated Vital Signs BP (!) 132/96   Pulse 89  Temp 98.8 F (37.1 C) (Oral)   Resp 10   Ht 5\' 10"  (1.778 m)   Wt 113.4 kg   SpO2 93%   BMI 35.87 kg/m  Physical Exam Vitals and nursing note reviewed.  Constitutional:      General: He is not in acute distress.    Appearance: He is well-developed.  HENT:     Head: Normocephalic and atraumatic.  Eyes:     Conjunctiva/sclera: Conjunctivae normal.     Pupils: Pupils are equal, round, and reactive to light.  Cardiovascular:     Rate and Rhythm: Regular rhythm. Tachycardia present.     Pulses: Normal pulses.     Heart sounds: No murmur heard. Pulmonary:     Effort: Pulmonary effort is normal. No respiratory distress.     Breath sounds: Normal breath sounds. No wheezing or rales.   Abdominal:     General: There is no distension.     Palpations: Abdomen is soft.     Tenderness: There is no abdominal tenderness. There is no guarding or rebound.  Musculoskeletal:        General: Tenderness and deformity present.     Cervical back: Normal range of motion and neck supple.     Right hip: Deformity and tenderness present. Decreased range of motion.     Comments: Surgical sites are healing well.  Leg is slightly shortened and externally rotated  Skin:    General: Skin is warm and dry.     Findings: No erythema or rash.  Neurological:     Mental Status: He is alert and oriented to person, place, and time. Mental status is at baseline.  Psychiatric:        Behavior: Behavior normal.     ED Results / Procedures / Treatments   Labs (all labs ordered are listed, but only abnormal results are displayed) Labs Reviewed - No data to display  EKG None  Radiology DG HIP UNILAT WITH PELVIS 1V RIGHT  Result Date: 04/05/2023 CLINICAL DATA:  Reduction of prosthetic right hip EXAM: DG HIP (WITH OR WITHOUT PELVIS) 1V RIGHT COMPARISON:  Study done earlier today FINDINGS: There is interval reduction of laterally dislocated femoral head prosthesis into the acetabular cup. No fracture lines are seen. There is surgical fusion in lower lumbar spine. IMPRESSION: Reduction of dislocated prosthetic right hip.  No fracture is seen. Electronically Signed   By: Ernie Avena M.D.   On: 04/05/2023 20:14   DG Hip Unilat  With Pelvis 2-3 Views Right  Result Date: 04/05/2023 CLINICAL DATA:  Pain after injury EXAM: DG HIP (WITH OR WITHOUT PELVIS) 3V RIGHT COMPARISON:  Hip x-ray 09/02/2019 FINDINGS: Since the prior x-ray there has been placement of a right hip hemiarthroplasty with Press-Fit components. This is dislocated with the femoral component displaced superior and lateral to the acetabular cup. No secondary fracture. Mild joint space loss of the left hip with small osteophytes. Fixation  hardware along the lower lumbar spine at the edge of the imaging field. Preserved bone mineralization. There are some well rounded densities in the pelvis which are indeterminate although possibly vascular. Hyperostosis seen about the pelvis. IMPRESSION: Dislocated right hip hemiarthroplasty Electronically Signed   By: Karen Kays M.D.   On: 04/05/2023 18:51    Procedures Reduction of dislocation  Date/Time: 04/05/2023 8:50 PM  Performed by: Gwyneth Sprout, MD Authorized by: Gwyneth Sprout, MD  Consent: Verbal consent obtained. Risks and benefits: risks, benefits and alternatives were discussed Consent given by: patient  Patient understanding: patient states understanding of the procedure being performed Imaging studies: imaging studies available Patient identity confirmed: verbally with patient Time out: Immediately prior to procedure a "time out" was called to verify the correct patient, procedure, equipment, support staff and site/side marked as required. Local anesthesia used: no  Anesthesia: Local anesthesia used: no  Sedation: Patient sedated: yes Sedation type: moderate (conscious) sedation Sedatives: ketamine and propofol Analgesia: morphine  Patient tolerance: patient tolerated the procedure well with no immediate complications Comments: With traction and some internal rotation pt's hip was easily reduced.       Medications Ordered in ED Medications  propofol (DIPRIVAN) 10 mg/mL bolus/IV push 56.7 mg (has no administration in time range)  morphine (PF) 4 MG/ML injection 8 mg (8 mg Intravenous Given 04/05/23 1806)  ondansetron (ZOFRAN) injection 4 mg (4 mg Intravenous Given 04/05/23 1806)  sodium chloride 0.9 % bolus 500 mL (500 mLs Intravenous New Bag/Given 04/05/23 1830)  propofol (DIPRIVAN) 10 mg/mL bolus/IV push (0 mg Intravenous See Procedure Record 04/05/23 2006)  ketamine (KETALAR) injection ( Intravenous See Procedure Record 04/05/23 2007)    ED Course/  Medical Decision Making/ A&P                             Medical Decision Making Amount and/or Complexity of Data Reviewed Radiology: ordered and independent interpretation performed. Decision-making details documented in ED Course.  Risk Prescription drug management.   Patient presenting today after feeling like his hip dislocated.  Patient is in significant pain and does have deformity and concern for hip dislocation.  He had no other evidence of injury during this time.  He is neurovascularly intact.  Will initially get images to make sure all the hardware looks normal and that hip is definitively dislocated and not fractured. I have independently visualized and interpreted pt's images today. Right hip with dislocation of prosthetic.  Findings were discussed with the patient.  He was consented for sedation and hip relocation.  Dr. Doran Durand performed the sedation.  9:16 PM Hip reduced with sedation.  Repeat imaging of the hip shows reduction without other acute findings.  Patient was placed in a knee immobilizer and she was able to ambulate here.  Feel that patient is stable for discharge home and follow-up with Dr. Linna Caprice in the office.         Final Clinical Impression(s) / ED Diagnoses Final diagnoses:  Dislocation of right hip, initial encounter [facility])    Rx / DC Orders ED Discharge Orders          Ordered    oxyCODONE-acetaminophen (PERCOCET/ROXICET) 5-325 MG tablet  Every 6 hours PRN        04/05/23 2116              Gwyneth Sprout, MD 04/05/23 2116

## 2023-04-06 ENCOUNTER — Other Ambulatory Visit: Payer: Self-pay | Admitting: Nurse Practitioner

## 2023-04-06 DIAGNOSIS — L209 Atopic dermatitis, unspecified: Secondary | ICD-10-CM

## 2023-04-06 MED ORDER — CLOBETASOL PROPIONATE 0.05 % EX CREA
1.0000 | TOPICAL_CREAM | Freq: Two times a day (BID) | CUTANEOUS | 5 refills | Status: DC
Start: 2023-04-06 — End: 2024-03-03

## 2023-04-06 NOTE — Telephone Encounter (Signed)
I changed to cream and sent to [facility]

## 2023-04-14 ENCOUNTER — Telehealth: Payer: Self-pay | Admitting: *Deleted

## 2023-04-14 NOTE — Telephone Encounter (Signed)
LVM to call back from message he left

## 2023-04-17 ENCOUNTER — Emergency Department (HOSPITAL_COMMUNITY): Payer: Medicare Other

## 2023-04-17 ENCOUNTER — Emergency Department (HOSPITAL_COMMUNITY)
Admission: EM | Admit: 2023-04-17 | Discharge: 2023-04-17 | Disposition: A | Discharge status: Home / Self Care | Payer: Medicare Other | Attending: Emergency Medicine | Admitting: Emergency Medicine

## 2023-04-17 ENCOUNTER — Other Ambulatory Visit: Payer: Self-pay

## 2023-04-17 ENCOUNTER — Encounter (HOSPITAL_COMMUNITY): Payer: Self-pay

## 2023-04-17 DIAGNOSIS — Y792 Prosthetic and other implants, materials and accessory orthopedic devices associated with adverse incidents: Secondary | ICD-10-CM | POA: Insufficient documentation

## 2023-04-17 DIAGNOSIS — S73004A Unspecified dislocation of right hip, initial encounter: Secondary | ICD-10-CM

## 2023-04-17 DIAGNOSIS — M25551 Pain in right hip: Secondary | ICD-10-CM | POA: Diagnosis present

## 2023-04-17 DIAGNOSIS — J45909 Unspecified asthma, uncomplicated: Secondary | ICD-10-CM | POA: Diagnosis not present

## 2023-04-17 DIAGNOSIS — I10 Essential (primary) hypertension: Secondary | ICD-10-CM | POA: Insufficient documentation

## 2023-04-17 DIAGNOSIS — F17200 Nicotine dependence, unspecified, uncomplicated: Secondary | ICD-10-CM | POA: Insufficient documentation

## 2023-04-17 DIAGNOSIS — Z96641 Presence of right artificial hip joint: Secondary | ICD-10-CM | POA: Insufficient documentation

## 2023-04-17 DIAGNOSIS — T84020A Dislocation of internal right hip prosthesis, initial encounter: Secondary | ICD-10-CM | POA: Diagnosis not present

## 2023-04-17 LAB — CBC WITH DIFFERENTIAL/PLATELET
Abs Immature Granulocytes: 0.06 10*3/uL (ref 0.00–0.07)
Basophils Absolute: 0.1 10*3/uL (ref 0.0–0.1)
Basophils Relative: 1 %
Eosinophils Absolute: 0.5 10*3/uL (ref 0.0–0.5)
Eosinophils Relative: 4 %
HCT: 45.2 % (ref 39.0–52.0)
Hemoglobin: 14.6 g/dL (ref 13.0–17.0)
Immature Granulocytes: 1 %
Lymphocytes Relative: 16 %
Lymphs Abs: 1.7 10*3/uL (ref 0.7–4.0)
MCH: 30.5 pg (ref 26.0–34.0)
MCHC: 32.3 g/dL (ref 30.0–36.0)
MCV: 94.4 fL (ref 80.0–100.0)
Monocytes Absolute: 1 10*3/uL (ref 0.1–1.0)
Monocytes Relative: 9 %
Neutro Abs: 7.7 10*3/uL (ref 1.7–7.7)
Neutrophils Relative %: 69 %
Platelets: 214 10*3/uL (ref 150–400)
RBC: 4.79 MIL/uL (ref 4.22–5.81)
RDW: 13.4 % (ref 11.5–15.5)
WBC: 11.1 10*3/uL — ABNORMAL HIGH (ref 4.0–10.5)
nRBC: 0 % (ref 0.0–0.2)

## 2023-04-17 LAB — BASIC METABOLIC PANEL
Anion gap: 9 (ref 5–15)
BUN: 19 mg/dL (ref 8–23)
CO2: 22 mmol/L (ref 22–32)
Calcium: 8.7 mg/dL — ABNORMAL LOW (ref 8.9–10.3)
Chloride: 107 mmol/L (ref 98–111)
Creatinine, Ser: 1 mg/dL (ref 0.61–1.24)
GFR, Estimated: >60 mL/min (ref >60)
Glucose, Bld: 106 mg/dL — ABNORMAL HIGH (ref 70–99)
Potassium: 3.8 mmol/L (ref 3.5–5.1)
Sodium: 138 mmol/L (ref 135–145)

## 2023-04-17 MED ORDER — SODIUM CHLORIDE 0.9 % IV SOLN: INTRAVENOUS | Status: DC

## 2023-04-17 MED ORDER — MORPHINE SULFATE (PF) 4 MG/ML IV SOLN
4.0000 mg | Freq: Once | INTRAVENOUS | Status: AC
  Administered 2023-04-17: 4 mg via INTRAVENOUS
  Filled 2023-04-17: qty 1

## 2023-04-17 MED ORDER — SODIUM CHLORIDE 0.9 % IV BOLUS: 1000.0000 mL | Freq: Once | INTRAVENOUS | Status: DC

## 2023-04-17 MED ORDER — KETAMINE HCL 50 MG/5ML IJ SOSY
0.5000 mg/kg | PREFILLED_SYRINGE | Freq: Once | INTRAMUSCULAR | Status: AC
  Administered 2023-04-17: 57 mg via INTRAVENOUS
  Filled 2023-04-17: qty 10

## 2023-04-17 MED ORDER — PROPOFOL 10 MG/ML IV BOLUS
0.5000 mg/kg | Freq: Once | INTRAVENOUS | Status: AC
  Administered 2023-04-17: 56.7 mg via INTRAVENOUS
  Filled 2023-04-17: qty 20

## 2023-04-17 NOTE — Progress Notes (Signed)
Orthopedic Tech Progress Note Patient Details:  [name] November 07, 1955 [id number]  Patient ID: Ronald Parsons, male   DOB: 1956-06-06, 67 y.o.   MRN: [id number] Patietn already has knee immobilizer from previous reduction, this was used for today's reduction as well. Darleen Crocker 04/17/2023, 9:23 PM

## 2023-04-17 NOTE — ED Notes (Signed)
Ortho and respiratory called for reduction.

## 2023-04-17 NOTE — ED Provider Notes (Signed)
Reduction of dislocation  Date/Time: 04/17/2023 9:54 PM  Performed by: Netta Corrigan, PA-C Authorized by: Netta Corrigan, PA-C  Consent: Verbal consent obtained. Risks and benefits: risks, benefits and alternatives were discussed Consent given by: patient Patient understanding: patient states understanding of the procedure being performed Patient consent: the patient's understanding of the procedure matches consent given Test results: test results available and properly labeled Imaging studies: imaging studies available Patient identity confirmed: verbally with patient Time out: Immediately prior to procedure a "time out" was called to verify the correct patient, procedure, equipment, support staff and site/side marked as required. Local anesthesia used: no  Anesthesia: Local anesthesia used: no  Sedation: Patient sedated: yes Sedation type: moderate (conscious) sedation Sedatives: propofol Analgesia: ketamine Vitals: Vital signs were monitored during sedation.  Patient tolerance: patient tolerated the procedure well with no immediate complications       Remi Deter 04/17/23 2201    Sloan Leiter, DO 04/18/23 [phone]

## 2023-04-17 NOTE — Telephone Encounter (Signed)
Hey. I got this message and I have no idea what is available, it anything

## 2023-04-17 NOTE — Discharge Instructions (Addendum)
It was a pleasure caring for you today in the emergency department.  Please return to the emergency department for any worsening or worrisome symptoms.  Please call orthopedics tomorrow to arrange follow-up  Do not bend your right hip past 90 degrees.  Do not cross the midline with your right hip.  Do not rotate her right hip inwards.  When lying in bed your toes and knee should point towards the ceiling.  Very limited weightbearing to the right hip, toe touching only until cleared by orthopedics

## 2023-04-17 NOTE — ED Triage Notes (Signed)
Pt BIB EMS from home for possible right hip dislocation. Pt had surgery on it 7 weeks ago and he dislocated it again last Wednesday. Today pt bent over today and heard his hip pop and believes its dislocated again  160/90 100 HR 98% RA 20 RR  6mg  morphine given

## 2023-04-17 NOTE — Telephone Encounter (Signed)
Called pt to schedule [facility].  Patient is requesting an office visit with PCP this week before she leaves to discuss weight loss medication and hip issues. There are appointments available but I was uncertain if it was ok to schedule him since PCP was leaving. Please advise.

## 2023-04-17 NOTE — ED Provider Notes (Signed)
[place] EMERGENCY DEPARTMENT AT [facility] Provider Note  CSN: [id number] Arrival date & time: 04/17/23 1900  Chief Complaint(s) No chief complaint on file.  HPI Ronald Parsons is a 67 y.o. male with past medical history as below, significant for right total hip Dr. Linna Caprice, HLD, HTN, DISH, asthma who presents to the ED with complaint of right hip pain.  He reports around 5 PM he was walking in his front yard, his foot got snagged on something on the ground and he stumbled, experienced immediate pain to his right hip unable to bear weight.  Did not fall down.  Called EMS.  Was given morphine en route which did significant improve his symptoms.  No pain in other parts of his body.  No LOC or fall.  No loss of sensation to his right leg.  No chest pain, abdominal pain, nausea or vomiting or dyspnea.  Last time he ate was around 4 PM with some cookies.  Had last meal around lunchtime.  Past Medical History Past Medical History:  Diagnosis Date   Asthma    allergy related   Cervical radiculitis    Depression    DISH (diffuse idiopathic skeletal hyperostosis)    Gout    Hyperlipidemia    Hypertension    Lumbago    Osteoarthritis    Patient Active Problem List   Diagnosis Date Noted   Dyslipidemia, goal LDL below 100 01/16/2023   Other fatigue 01/16/2023   Hypertrophy of prostate 01/16/2023   Chronic right hip pain 01/16/2023   Encounter for preoperative examination for general surgical procedure 01/16/2023   Loud snoring 10/17/2022   Excessive daytime sleepiness 10/17/2022   Wheezing 06/09/2022   Acute recurrent maxillary sinusitis 03/06/2022   Dysuria 03/06/2022   Elevated prolactin level 02/14/2022   Murmur, cardiac 11/15/2021   Essential hypertension 10/18/2021   Low testosterone in male 10/18/2021   Primary insomnia 10/18/2021   Body mass index (BMI) of 34.0-34.9 in adult 10/18/2021   Angina pectoris (HCC) 06/09/2020   Dyspnea on exertion 06/09/2020    Acute blood loss anemia 07/31/2013   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 Authorizing Provider  bisoprolol-hydrochlorothiazide (ZIAC) 2.5-6.25 MG tablet TAKE 1/2 TO 1 TABLET BY MOUTH ONCE DAILY 01/13/23   Carlean Jews, NP  celecoxib (CELEBREX) 200 MG capsule Take 1 capsule (200 mg total) by mouth 2 (two) times daily as needed for mild pain or moderate pain. 01/13/23   Carlean Jews, NP  clobetasol cream (TEMOVATE) 0.05 % Apply 1 Application topically 2 (two) times daily. 04/06/23   Boscia, Kathlynn Grate, NP  LORATADINE-D 12HR 5-120 MG tablet TAKE 1 TABLET BY MOUTH 2 TIMES DAILY. 03/02/23   Carlean Jews, NP  nitroGLYCERIN (NITROSTAT) 0.4 MG SL tablet Place 1 tablet (0.4 mg total) under the tongue every 5 (five) minutes as needed for chest pain. If you require more than two tablets five minutes apart go to the nearest ER via EMS. 05/12/20 11/07/22  Tessa Lerner, DO  NON FORMULARY balance of nature    [provider]  oxyCODONE (ROXICODONE) 15 MG immediate release tablet TAKE 1/2 TABLET BY MOUTH ONCE DAILY AS NEEDED 01/13/23   Carlean Jews, NP  oxyCODONE-acetaminophen (PERCOCET/ROXICET) 5-325 MG tablet Take 1 tablet by mouth every 6 (six) hours as needed for severe pain. 04/05/23   Gwyneth Sprout, MD  phentermine (ADIPEX-P) 37.5  MG tablet TAKE 1 TABLET BY MOUTH DAILY 04/06/23   Boscia, Heather E, NP  SYRINGE-NEEDLE, DISP, 3 ML (B-D 3CC LUER-LOK SYR 22GX1-1/2) 22G X 1-1/2" 3 ML MISC To use with testosterone injections every 3 weeks 01/13/23   Carlean Jews, NP  tamsulosin (FLOMAX) 0.4 MG CAPS capsule Take 1 capsule (0.4 mg total) by mouth daily. 01/13/23   Carlean Jews, NP  testosterone cypionate (DEPOTESTOSTERONE CYPIONATE) 200 MG/ML injection INJECT 0.5 ML INTO THE MUSCLE EVERY 21 DAYS. 01/13/23   Carlean Jews, NP  triamcinolone (NASACORT) 55 MCG/ACT AERO  nasal inhaler Place 2 sprays into the nose daily. 01/13/23   Carlean Jews, NP  zolpidem (AMBIEN) 10 MG tablet Take 1 tablet (10 mg total) by mouth at bedtime. 01/13/23   Carlean Jews, NP                                                                                                                                    Past Surgical History Past Surgical History:  Procedure Laterality Date   BACK SURGERY  10/24/1989   laminectomy   CARDIAC CATHETERIZATION     5-7 years ago, states it was clear   COLONOSCOPY     HERNIA REPAIR     inguinal hernia repair as a baby on right groin   KNEE SURGERY Right    2023   RIGHT/LEFT HEART CATH AND CORONARY ANGIOGRAPHY N/A 06/09/2020   Procedure: RIGHT/LEFT HEART CATH AND CORONARY ANGIOGRAPHY;  Surgeon: Yates Decamp, MD;  Location: [facility] INVASIVE CV LAB;  Service: Cardiovascular;  Laterality: N/A;   SHOULDER SURGERY Bilateral    2017   Family History Family History  Problem Relation Age of Onset   Heart disease Father    Hypertension Father    Breast cancer Sister    Valvular heart disease Maternal Grandfather    Emphysema Paternal Grandfather     Social History Social History   Tobacco Use   Smoking status: Former   Smokeless tobacco: Current    Types: Associate Professor Use: Never used  Substance Use Topics   Alcohol use: Yes    Comment: once monthly   Drug use: No   Allergies Hydromorphone hcl, Penicillins, and Lovastatin  Review of Systems Review of Systems  Constitutional:  Negative for chills and fever.  HENT:  Negative for facial swelling and trouble swallowing.   Eyes:  Negative for photophobia and visual disturbance.  Respiratory:  Negative for cough and shortness of breath.   Cardiovascular:  Negative for chest pain and palpitations.  Gastrointestinal:  Negative for abdominal pain, nausea and vomiting.  Endocrine: Negative for polydipsia and polyuria.  Genitourinary:  Negative for difficulty urinating and  hematuria.  Musculoskeletal:  Positive for arthralgias and gait problem. Negative for joint swelling.  Skin:  Negative for pallor and rash.  Neurological:  Negative for syncope and headaches.  Psychiatric/Behavioral:  Negative for agitation and confusion.     Physical Exam Vital Signs  I have reviewed the triage vital signs BP 121/73   Pulse 77   Temp 98.1 F (36.7 C) (Oral)   Resp 13   SpO2 100%  Physical Exam Vitals and nursing note reviewed.  Constitutional:      General: He is not in acute distress.    Appearance: He is well-developed.  HENT:     Head: Normocephalic and atraumatic.     Right Ear: External ear normal.     Left Ear: External ear normal.     Mouth/Throat:     Mouth: Mucous membranes are moist.  Eyes:     General: No scleral icterus. Cardiovascular:     Rate and Rhythm: Normal rate and regular rhythm.     Pulses: Normal pulses.     Heart sounds: Normal heart sounds.  Pulmonary:     Effort: Pulmonary effort is normal. No respiratory distress.     Breath sounds: Normal breath sounds.  Abdominal:     General: Abdomen is flat.     Palpations: Abdomen is soft.     Tenderness: There is no abdominal tenderness.  Musculoskeletal:        General: Tenderness present.     Right lower leg: No edema.     Left lower leg: No edema.       Legs:     Comments: LE NVI Significant pain to either knee or ankle.  Left hip without tenderness on palpation or movement But unable to lift right leg secondary to pain.  Able to move foot and toes freely to right leg  Skin:    General: Skin is warm and dry.     Capillary Refill: Capillary refill takes less than 2 seconds.  Neurological:     Mental Status: He is alert and oriented to person, place, and time.  Psychiatric:        Mood and Affect: Mood normal.        Behavior: Behavior normal.     ED Results and Treatments Labs (all labs ordered are listed, but only abnormal results are displayed) Labs Reviewed  CBC  WITH DIFFERENTIAL/PLATELET - Abnormal; Notable for the following components:      Result Value   WBC 11.1 (*)    All other components within normal limits  BASIC METABOLIC PANEL - Abnormal; Notable for the following components:   Glucose, Bld 106 (*)    Calcium 8.7 (*)    All other components within normal limits                                                                                                                          Radiology No results found.  Pertinent labs & imaging results that were available during my care of the patient were reviewed by me and considered in my medical decision making (see MDM for details).  Medications Ordered in ED Medications  morphine (  PF) 4 MG/ML injection 4 mg (4 mg Intravenous Given 04/17/23 1931)  ketamine 50 mg in normal saline 5 mL (10 mg/mL) syringe (57 mg Intravenous Given 04/17/23 2109)  propofol (DIPRIVAN) 10 mg/mL bolus/IV push 56.7 mg (56.7 mg Intravenous Given 04/17/23 2110)  ketamine 50 mg in normal saline 5 mL (10 mg/mL) syringe (57 mg Intravenous Given 04/17/23 2145)  propofol (DIPRIVAN) 10 mg/mL bolus/IV push 56.7 mg (56.7 mg Intravenous Given 04/17/23 2145)                                                                                                                                     Procedures .Sedation  Date/Time: 04/17/2023 11:00 PM  Performed by: Sloan Leiter, DO Authorized by: Sloan Leiter, DO   Consent:    Consent obtained:  Verbal and written   Consent given by:  Patient   Risks discussed:  Allergic reaction, dysrhythmia and inadequate sedation   Alternatives discussed:  Analgesia without sedation and anxiolysis Universal protocol:    Immediately prior to procedure, a time out was called: yes     Patient identity confirmed:  Arm band and verbally with patient Indications:    Procedure performed:  Dislocation reduction   Procedure necessitating sedation performed by:  Physician performing  sedation Pre-sedation assessment:    Time since last food or drink:  4 hours   ASA classification: class 1 - normal, healthy patient     Mallampati score:  I - soft palate, uvula, fauces, pillars visible   Neck mobility: normal     Pre-sedation assessments completed and reviewed: airway patency, cardiovascular function, hydration status, mental status, nausea/vomiting, pain level, respiratory function and temperature     Pre-sedation assessment completed:  04/17/2023 9:00 PM Immediate pre-procedure details:    Reassessment: Patient reassessed immediately prior to procedure     Reviewed: vital signs, relevant labs/tests and NPO status     Verified: bag valve mask available, emergency equipment available, intubation equipment available, IV patency confirmed, oxygen available, reversal medications available and suction available   Procedure details (see MAR for exact dosages):    Preoxygenation:  Nasal cannula   Sedation:  Ketamine and propofol   Intended level of sedation: deep   Analgesia:  Morphine   Intra-procedure monitoring:  Blood pressure monitoring, cardiac monitor, continuous pulse oximetry, continuous capnometry, frequent LOC assessments and frequent vital sign checks   Intra-procedure events: none     Total Provider sedation time (minutes):  17 Post-procedure details:    Post-sedation assessment completed:  04/17/2023 9:30 PM   Attendance: Constant attendance by certified staff until patient recovered     Recovery: Patient returned to pre-procedure baseline     Post-sedation assessments completed and reviewed: airway patency, cardiovascular function, hydration status, mental status, nausea/vomiting, pain level, respiratory function and temperature     Patient is stable for discharge or admission: yes     Procedure completion:  Tolerated well, no immediate complications .Sedation  Date/Time: 04/17/2023 11:01 PM  Performed by: Sloan Leiter, DO Authorized by: Sloan Leiter, DO    Consent:    Consent obtained:  Verbal and written   Consent given by:  Patient   Risks discussed:  Allergic reaction, dysrhythmia and inadequate sedation   Alternatives discussed:  Analgesia without sedation and anxiolysis Universal protocol:    Procedure explained and questions answered to patient or proxy's satisfaction: yes     Immediately prior to procedure, a time out was called: yes     Patient identity confirmed:  Provided demographic data, arm band and verbally with patient Indications:    Procedure performed:  Dislocation reduction   Procedure necessitating sedation performed by:  Physician performing sedation Pre-sedation assessment:    Time since last food or drink:  4   ASA classification: class 1 - normal, healthy patient     Mallampati score:  I - soft palate, uvula, fauces, pillars visible   Neck mobility: normal     Pre-sedation assessments completed and reviewed: airway patency     Pre-sedation assessment completed:  04/17/2023 9:40 PM Immediate pre-procedure details:    Reassessment: Patient reassessed immediately prior to procedure     Reviewed: vital signs, relevant labs/tests and NPO status     Verified: bag valve mask available, emergency equipment available, intubation equipment available, IV patency confirmed, oxygen available, reversal medications available and suction available   Procedure details (see MAR for exact dosages):    Preoxygenation:  Nasal cannula   Sedation:  Propofol and ketamine   Intended level of sedation: deep   Intra-procedure monitoring:  Blood pressure monitoring, cardiac monitor, continuous pulse oximetry, continuous capnometry, frequent LOC assessments and frequent vital sign checks   Intra-procedure events: none     Total Provider sedation time (minutes):  35 Post-procedure details:    Post-sedation assessment completed:  04/17/2023 10:25 PM   Attendance: Constant attendance by certified staff until patient recovered     Recovery:  Patient returned to pre-procedure baseline     Post-sedation assessments completed and reviewed: airway patency, cardiovascular function, hydration status, mental status, nausea/vomiting, pain level, respiratory function and temperature     Patient is stable for discharge or admission: yes     Procedure completion:  Tolerated well, no immediate complications Reduction of dislocation  Date/Time: 04/17/2023 11:02 PM  Performed by: Sloan Leiter, DO Authorized by: Sloan Leiter, DO  Consent: Verbal consent obtained. Written consent obtained. Risks and benefits: risks, benefits and alternatives were discussed Consent given by: patient Patient understanding: patient states understanding of the procedure being performed Imaging studies: imaging studies available Required items: required blood products, implants, devices, and special equipment available Patient identity confirmed: verbally with patient, arm band and provided demographic data Time out: Immediately prior to procedure a "time out" was called to verify the correct patient, procedure, equipment, support staff and site/side marked as required. Preparation: Patient was prepped and draped in the usual sterile fashion. Local anesthesia used: no  Anesthesia: Local anesthesia used: no  Sedation: Patient sedated: yes Sedatives: propofol and ketamine Sedation start date/time: 05/17/2023 9:06 PM Sedation end date/time: 05/17/2023 9:23 PM Vitals: Vital signs were monitored during sedation.  Patient tolerance: patient tolerated the procedure well with no immediate complications Comments: Pt required sedation twice and two attempts to reduce hip dislocation. One attempt performed by PA as noted and one by myself      (including critical care time)  Medical Decision  Making / ED Course    Medical Decision Making:    Ronald Parsons is a 67 y.o. male with past medical history as below, significant for right total hip Dr. Linna Caprice,  HLD, HTN, DISH, asthma who presents to the ED with complaint of right hip pain.. The complaint involves an extensive differential diagnosis and also carries with it a high risk of complications and morbidity.  Serious etiology was considered. Ddx includes but is not limited to: Fracture, dislocation, soft tissue injury, ligamentous injury, etc.  Complete initial physical exam performed, notably the patient  was no acute distress, resting comfortably, tenderness to right hip approximately.  LE NVI.    Reviewed and confirmed nursing documentation for past medical history, family history, social history.  Vital signs reviewed.    Clinical Course as of 04/24/23 0744  Mon Apr 17, 2023  2006 Dislocation noted on wet read of x-ray.  Patient agreeable to sedation and reduction. [SG]  2158 Post reduction film at bedside appears reduced appropriately, will wait for official rads read [SG]    Clinical Course User Index [SG] Sloan Leiter, DO   Patient was seen 6/12 with dislocation of his right hip.  Pt here w/ recurrent hip dislocation Agreeable to reduction/sedation Completed as noted in procedure note No complications Advised minimal weight bearing/feather weight bearing, f/u with ortho Knee immobilizer for home until cleared by ortho Given post sedation instructions for home Stable for dc in care of spouse    The patient improved significantly and was discharged in stable condition. Detailed discussions were had with the patient regarding current findings, and need for close f/u with PCP or on call doctor. The patient has been instructed to return immediately if the symptoms worsen in any way for re-evaluation. Patient verbalized understanding and is in agreement with current care plan. All questions answered prior to discharge.     Additional history obtained: -Additional history obtained from ems -External records from outside source obtained and reviewed including: Chart review  including previous notes, labs, imaging, consultation notes including primary care documentation, recent ED visit for dislocation    Lab Tests: -I ordered, reviewed, and interpreted labs.   The pertinent results include:   Labs Reviewed  CBC WITH DIFFERENTIAL/PLATELET - Abnormal; Notable for the following components:      Result Value   WBC 11.1 (*)    All other components within normal limits  BASIC METABOLIC PANEL - Abnormal; Notable for the following components:   Glucose, Bld 106 (*)    Calcium 8.7 (*)    All other components within normal limits    Notable for labs stable   EKG   EKG Interpretation Date/Time:    Ventricular Rate:    PR Interval:    QRS Duration:    QT Interval:    QTC Calculation:   R Axis:      Text Interpretation:           Imaging Studies ordered: I ordered imaging studies including xr hip I independently visualized the following imaging with scope of interpretation limited to determining acute life threatening conditions related to emergency care; findings noted above, significant for dislocation I independently visualized and interpreted imaging. I agree with the radiologist interpretation   Medicines ordered and prescription drug management: Meds ordered this encounter  Medications   morphine (PF) 4 MG/ML injection 4 mg   DISCONTD: sodium chloride 0.9 % bolus 1,000 mL   ketamine 50 mg in normal saline 5 mL (10  mg/mL) syringe   propofol (DIPRIVAN) 10 mg/mL bolus/IV push 56.7 mg   DISCONTD: 0.9 %  sodium chloride infusion   ketamine 50 mg in normal saline 5 mL (10 mg/mL) syringe   propofol (DIPRIVAN) 10 mg/mL bolus/IV push 56.7 mg    -I have reviewed the patients home medicines and have made adjustments as needed   Consultations Obtained: na   Cardiac Monitoring: The patient was maintained on a cardiac monitor.  I personally viewed and interpreted the cardiac monitored which showed an underlying rhythm of: NSR  Social  Determinants of Health:  Diagnosis or treatment significantly limited by social determinants of health: current smoker and obesity   Reevaluation: After the interventions noted above, I reevaluated the patient and found that they have improved  Co morbidities that complicate the patient evaluation  Past Medical History:  Diagnosis Date   Asthma    allergy related   Cervical radiculitis    Depression    DISH (diffuse idiopathic skeletal hyperostosis)    Gout    Hyperlipidemia    Hypertension    Lumbago    Osteoarthritis       Dispostion: Disposition decision including need for hospitalization was considered, and patient discharged from emergency department.    Final Clinical Impression(s) / ED Diagnoses Final diagnoses:  Hip dislocation, right, initial encounter [facility])     This chart was dictated using voice recognition software.  Despite best efforts to proofread,  errors can occur which can change the documentation meaning.    Sloan Leiter, DO 04/24/23 [phone]

## 2023-04-19 ENCOUNTER — Ambulatory Visit: Payer: Medicare Other | Admitting: Nurse Practitioner

## 2023-04-19 NOTE — Progress Notes (Deleted)
Established patient visit   Patient: Ronald Parsons   DOB: 1956/09/26   67 y.o. Male  MRN: [id number] Visit Date: 04/19/2023   No chief complaint on file.  Subjective    HPI  Follow up  -wants to discuss weight management  -hip pain  --was to have hip replacement of right hip. Does see orthopedic provider   Medications: Outpatient Medications Prior to Visit  Medication Sig   bisoprolol-hydrochlorothiazide (ZIAC) 2.5-6.25 MG tablet TAKE 1/2 TO 1 TABLET BY MOUTH ONCE DAILY   celecoxib (CELEBREX) 200 MG capsule Take 1 capsule (200 mg total) by mouth 2 (two) times daily as needed for mild pain or moderate pain.   clobetasol cream (TEMOVATE) 0.05 % Apply 1 Application topically 2 (two) times daily.   LORATADINE-D 12HR 5-120 MG tablet TAKE 1 TABLET BY MOUTH 2 TIMES DAILY.   nitroGLYCERIN (NITROSTAT) 0.4 MG SL tablet Place 1 tablet (0.4 mg total) under the tongue every 5 (five) minutes as needed for chest pain. If you require more than two tablets five minutes apart go to the nearest ER via EMS.   NON FORMULARY balance of nature   oxyCODONE (ROXICODONE) 15 MG immediate release tablet TAKE 1/2 TABLET BY MOUTH ONCE DAILY AS NEEDED   oxyCODONE-acetaminophen (PERCOCET/ROXICET) 5-325 MG tablet Take 1 tablet by mouth every 6 (six) hours as needed for severe pain.   phentermine (ADIPEX-P) 37.5 MG tablet TAKE 1 TABLET BY MOUTH DAILY   SYRINGE-NEEDLE, DISP, 3 ML (B-D 3CC LUER-LOK SYR 22GX1-1/2) 22G X 1-1/2" 3 ML MISC To use with testosterone injections every 3 weeks   tamsulosin (FLOMAX) 0.4 MG CAPS capsule Take 1 capsule (0.4 mg total) by mouth daily.   testosterone cypionate (DEPOTESTOSTERONE CYPIONATE) 200 MG/ML injection INJECT 0.5 ML INTO THE MUSCLE EVERY 21 DAYS.   triamcinolone (NASACORT) 55 MCG/ACT AERO nasal inhaler Place 2 sprays into the nose daily.   zolpidem (AMBIEN) 10 MG tablet Take 1 tablet (10 mg total) by mouth at bedtime.   No facility-administered medications prior to visit.     Review of Systems  {Labs (Optional):23779}   Objective    There were no vitals filed for this visit. There is no height or weight on file to calculate BMI.  BP Readings from Last 3 Encounters:  04/17/23 121/73  04/05/23 (Abnormal) 132/96  01/30/23 105/67    Wt Readings from Last 3 Encounters:  04/05/23 250 lb (113.4 kg)  01/30/23 253 lb 12.8 oz (115.1 kg)  01/13/23 261 lb 6.4 oz (118.6 kg)    Physical Exam  ***  No results found for any visits on 04/19/23.  Assessment & Plan    There are no diagnoses linked to this encounter.   No follow-ups on file.         Carlean Jews, NP  [facility] at [facility] [phone] (phone) [phone] (fax)  [facility]

## 2023-04-20 ENCOUNTER — Telehealth: Payer: Self-pay

## 2023-04-20 NOTE — Telephone Encounter (Signed)
Contacted patient on preferred number listed in notes for scheduled AWV. Patient unable to complete visit today due to an emergency. Call back to reschedule.

## 2023-05-26 ENCOUNTER — Other Ambulatory Visit: Payer: No Typology Code available for payment source

## 2023-05-26 ENCOUNTER — Ambulatory Visit (INDEPENDENT_AMBULATORY_CARE_PROVIDER_SITE_OTHER): Payer: Medicare Other | Admitting: Family Medicine

## 2023-05-26 ENCOUNTER — Encounter: Payer: Self-pay | Admitting: Family Medicine

## 2023-05-26 VITALS — BP 117/81 | HR 68 | Ht 70.0 in | Wt 248.1 lb

## 2023-05-26 DIAGNOSIS — R5383 Other fatigue: Secondary | ICD-10-CM

## 2023-05-26 DIAGNOSIS — E785 Hyperlipidemia, unspecified: Secondary | ICD-10-CM

## 2023-05-26 DIAGNOSIS — Z6838 Body mass index (BMI) 38.0-38.9, adult: Secondary | ICD-10-CM | POA: Diagnosis not present

## 2023-05-26 DIAGNOSIS — R21 Rash and other nonspecific skin eruption: Secondary | ICD-10-CM

## 2023-05-26 DIAGNOSIS — R7989 Other specified abnormal findings of blood chemistry: Secondary | ICD-10-CM

## 2023-05-26 DIAGNOSIS — I10 Essential (primary) hypertension: Secondary | ICD-10-CM

## 2023-05-26 DIAGNOSIS — Z6834 Body mass index (BMI) 34.0-34.9, adult: Secondary | ICD-10-CM

## 2023-05-26 DIAGNOSIS — G8929 Other chronic pain: Secondary | ICD-10-CM

## 2023-05-26 DIAGNOSIS — D229 Melanocytic nevi, unspecified: Secondary | ICD-10-CM

## 2023-05-26 DIAGNOSIS — M25551 Pain in right hip: Secondary | ICD-10-CM

## 2023-05-26 DIAGNOSIS — Z131 Encounter for screening for diabetes mellitus: Secondary | ICD-10-CM

## 2023-05-26 NOTE — Assessment & Plan Note (Signed)
Discussed weight loss with the patient.  He will try taking his phentermine again.  Advised him to stick to a calorie boost diet with a goal between 1400-1800 calories per day.

## 2023-05-26 NOTE — Assessment & Plan Note (Signed)
Posterior scalp above the hairline.  Advised patient to try Nizoral shampoo.  If this is ineffective we can discuss alternative treatments at next visit.

## 2023-05-26 NOTE — Patient Instructions (Addendum)
It was nice to see you today,  We addressed the following topics today: -You can change your appointment to a month from now. - We can get some blood test today and talk about them at your next visit - For your mole we can do a punch biopsy when we get our biopsy equipment in. - For the itchiness on the back or neck you can use something called Nizoral shampoo.  This is available over-the-counter.  Use it once a day for 2 weeks and if the itching resolves, you can use it once a week after that. - Try taking your phentermine to help with weight loss - Most of your weight loss will come from restricting how many calories you taken.  If you limit your calories to 1400 to 1800 cal/day every day you will lose weight over time.  Use calorie counting apps or a food diary to help with this.  Have a great day,  Frederic Jericho, MD

## 2023-05-26 NOTE — Assessment & Plan Note (Signed)
LDL not significantly elevated, but his ASCVD risk is elevated.  Patient does not want to take statins or other medications for cholesterol.  Is trying to lose weight.  He does have a recent cardiac catheterization that showed moderate calcifications.  Recommendation at that time was to maximize treatment of modifiable risk factors.  Continue discussion with patient regarding treatment of ASCVD

## 2023-05-26 NOTE — Assessment & Plan Note (Signed)
Irregularly bordered, 1.5 cm in diameter approximately.  Can consider biopsy here if patient desires at a subsequent visit.

## 2023-05-26 NOTE — Assessment & Plan Note (Signed)
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  Has follow-up with orthopedics next week.

## 2023-05-26 NOTE — Progress Notes (Signed)
Established Patient Office Visit  Subjective   Patient ID: Ronald Parsons, male    DOB: 1956-01-22  Age: 67 y.o. MRN: [id number]  Chief Complaint  Patient presents with   weight    HPI   HLD-patient is not interested in taking any cholesterol medications.  States he had a cardiac catheterization in 2021 in the "did not find any plaques".  Weight loss-patient wanted to discuss weight loss medications.  We discussed how newer medications are not covered by his insurance if he does not have diabetes.  He has been on phentermine before, has lost weight on it before.  Did recently try using again but felt like he did not lose any weight on it.   Mole -patient has a mole on his back that he would like to have evaluated.  Has appointment with the dermatologist later in the year.  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  Then on June 12 and 24 he dislocated it on 2 separate occasions.  First time he thinks maybe he got his foot caught on something, but second time he was just bending over to get something.  Going next week to the orthopedist.  Feels like his repaired leg is "longer" than the left.  Patient complains of a "rash" on the back of his neck going on for the past 6 months.  Has tried different creams for it but nothing has worked.  Has a history of eczema.   The 10-year ASCVD risk score (Arnett DK, et al., 2019) is: 14.3%     ROS    Objective:     BP 117/81   Pulse 68   Ht 5\' 10"  (1.778 m)   Wt 248 lb 1.9 oz (112.5 kg)   SpO2 97%   BMI 35.60 kg/m    Physical Exam General: Alert and oriented HEENT: Mild erythematous rash above the hairline posteriorly.  [place]onscaly. CV: Regular rate and rhythm Pulmonary: Lungs clear bilaterally Skin: Flat, irregularly bordered nevus on the upper posterior torso. MSK: Patient ambulating with antalgic gait   No results found for any visits on 05/26/23.        Assessment & Plan:     Dyslipidemia,  goal LDL below 100 Assessment & Plan: LDL not significantly elevated, but his ASCVD risk is elevated.  Patient does not want to take statins or other medications for cholesterol.  Is trying to lose weight.  He does have a recent cardiac catheterization that showed moderate calcifications.  Recommendation at that time was to maximize treatment of modifiable risk factors.  Continue discussion with patient regarding treatment of ASCVD  Orders: -     Lipid panel -     Hemoglobin A1c  Screening for diabetes mellitus -     Hemoglobin A1c  Body mass index (BMI) of 34.0-34.9 in adult Assessment & Plan: Discussed weight loss with the patient.  He will try taking his phentermine again.  Advised him to stick to a calorie boost diet with a goal between 1400-1800 calories per day.   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  Has follow-up with orthopedics next week.   Enlarged skin mole Assessment & Plan: Irregularly bordered, 1.5 cm in diameter approximately.  Can consider biopsy here if patient desires at a subsequent visit.   Rash Assessment & Plan: Posterior scalp above the hairline.  Advised patient to try Nizoral shampoo.  If this is  ineffective we can discuss alternative treatments at next visit.      Return in about 4 weeks (around 06/23/2023).    Sandre Kitty, MD

## 2023-06-02 ENCOUNTER — Ambulatory Visit: Payer: No Typology Code available for payment source | Admitting: Family Medicine

## 2023-06-02 ENCOUNTER — Ambulatory Visit: Payer: No Typology Code available for payment source | Admitting: Nurse Practitioner

## 2023-06-13 ENCOUNTER — Other Ambulatory Visit: Payer: Self-pay | Admitting: Nurse Practitioner

## 2023-06-13 DIAGNOSIS — F5101 Primary insomnia: Secondary | ICD-10-CM

## 2023-06-13 DIAGNOSIS — M4716 Other spondylosis with myelopathy, lumbar region: Secondary | ICD-10-CM

## 2023-06-13 DIAGNOSIS — I10 Essential (primary) hypertension: Secondary | ICD-10-CM

## 2023-06-22 ENCOUNTER — Other Ambulatory Visit (HOSPITAL_COMMUNITY)
Admission: RE | Admit: 2023-06-22 | Discharge: 2023-06-22 | Disposition: A | Discharge status: Home / Self Care | Payer: Medicare Other | Source: Ambulatory Visit

## 2023-06-22 ENCOUNTER — Encounter: Payer: Self-pay | Admitting: Family Medicine

## 2023-06-22 ENCOUNTER — Ambulatory Visit: Payer: Medicare Other | Admitting: Family Medicine

## 2023-06-22 VITALS — BP 126/83 | HR 81 | Resp 18 | Ht 70.0 in | Wt 253.0 lb

## 2023-06-22 DIAGNOSIS — D229 Melanocytic nevi, unspecified: Secondary | ICD-10-CM

## 2023-06-22 DIAGNOSIS — L989 Disorder of the skin and subcutaneous tissue, unspecified: Secondary | ICD-10-CM | POA: Diagnosis not present

## 2023-06-22 DIAGNOSIS — R21 Rash and other nonspecific skin eruption: Secondary | ICD-10-CM

## 2023-06-22 DIAGNOSIS — G8929 Other chronic pain: Secondary | ICD-10-CM

## 2023-06-22 DIAGNOSIS — Z6834 Body mass index (BMI) 34.0-34.9, adult: Secondary | ICD-10-CM

## 2023-06-22 DIAGNOSIS — M25551 Pain in right hip: Secondary | ICD-10-CM | POA: Diagnosis not present

## 2023-06-22 NOTE — Progress Notes (Signed)
Established Patient Office Visit  Subjective   Patient ID: Ronald Parsons, male    DOB: 28-May-1956  Age: 67 y.o. MRN: [id number]  Chief Complaint  Patient presents with   Hypertension   Nevus    On chest and back    HPI  Obesity-patient no longer taking the phentermine.  Did not like the way it made him feel.  Hip pain-patient has not had any more hip pain or dislocations since his last visit with me.  Not doing physical therapy at the moment states it is orthopedic doctor recommended against it.  Patient was not sure which Nizoral shampoo take, the psoriasis or the ketoconazole containing 1.  Discussed taking the Nizoral with ketoconazole as the mean gradient.  Patient has that but has not started using it.  Moles-patient wants to have some of his skin lesions removed or frozen off.  We discussed how we do not have the tools needed to do cryotherapy today, but we can take biopsies of his lesions.  Has a lesion on the left pectoral, left scapula, right scapula, right posterior deltoid.  Pre-operative Diagnosis: Suspicious lesion  Post-operative Diagnosis: same  Locations: Left pectoral region, right rib deltoid, right scapular region    Anesthesia: Lidocaine 2% with epinephrine without added sodium bicarbonate  Procedure Details  History of allergy to iodine: no Patient informed of the risks (including bleeding and infection) and benefits of the  procedure and Verbal informed consent obtained.  The lesion and surrounding area was given a sterile prep using alcohol and draped in the usual sterile fashion. The skin was then stretched perpendicular to the skin tension lines and the lesion removed using the 4 mm punch for the right scapular lesion.   The resulting ellipse was then closed. The wound was closed with Steri-Strips. Antibiotic ointment and a sterile dressing applied. Razors used to remove raised lesions on the right rear deltoid and left pectoral. The specimen was  sent for pathologic examination. The patient tolerated the procedure well.  EBL: 1 ml   Condition: Stable  Complications: none.   The 10-year ASCVD risk score (Arnett DK, et al., 2019) is: 15.7%  Health Maintenance Due  Topic Date Due   Medicare Annual Wellness (AWV)  Never done   Hepatitis C Screening  Never done   DTaP/Tdap/Td (1 - Tdap) Never done   Pneumonia Vaccine 44+ Years old (1 of 1 - PCV) Never done   COVID-19 Vaccine (1 - 2023-24 season) Never done      Objective:     BP 126/83 (BP Location: Left Arm, Patient Position: Sitting, Cuff Size: Large)   Pulse 81   Resp 18   Ht 5\' 10"  (1.778 m)   Wt 253 lb (114.8 kg)   SpO2 95%   BMI 36.30 kg/m    Physical Exam General: Alert, oriented CV: Regular rate and rhythm Pulmonary: Clear bilaterally Skin: 1 cm raised hyperpigmented lesion on the right posterior deltoid, 2 cm irregularly bordered flat hyperpigmented lesion over the right scapula.  0.5 cm raised rough hyperpigmented lesion over the left scapula, 1/2 cm raised flesh-colored lesion on the left pectoral region.      Assessment & Plan:   Skin lesion -     Surgical pathology -     Surgical pathology -     Surgical pathology  Enlarged skin mole Assessment & Plan: Punch biopsy of flat right sided scapular lesion, shave biopsy of left pectoral and right deltoid raised lesions.  Sent  off to pathology.   Rash Assessment & Plan: Clarified with the patient to use the ketoconazole containing Nizoral shampoo.  Patient has this at home, will begin using it.   Chronic right hip pain Assessment & Plan: Symptoms are much better since her last visit.  No further dislocation of this prosthetic hip.   Body mass index (BMI) of 34.0-34.9 in adult Assessment & Plan: Patient has stopped taking his phentermine.  Does not like the way he feels on it.  Will take off med list.   Punch Biopsy Procedure Note   Plan: 1. Instructed to keep the wound dry and  covered for 24-48h and clean thereafter. 2. Warning signs of infection were reviewed.   3. Recommended that the patient use OTC acetaminophen as needed for pain.    No follow-ups on file.    Sandre Kitty, MD   .

## 2023-06-22 NOTE — Assessment & Plan Note (Signed)
Patient has stopped taking his phentermine.  Does not like the way he feels on it.  Will take off med list.

## 2023-06-22 NOTE — Assessment & Plan Note (Signed)
Clarified with the patient to use the ketoconazole containing Nizoral shampoo.  Patient has this at home, will begin using it.

## 2023-06-22 NOTE — Assessment & Plan Note (Addendum)
Symptoms are much better since her last visit.  No further dislocation of this prosthetic hip.

## 2023-06-22 NOTE — Patient Instructions (Signed)
It was nice to see you today,  We addressed the following topics today: We would like to know the results of biopsy and get them. - Use the Nizoral shampoo that has ketoconazole as the active ingredient - I will see you again at your next visit in 3 months.  Have a great day,  Frederic Jericho, MD

## 2023-06-22 NOTE — Assessment & Plan Note (Signed)
Punch biopsy of flat right sided scapular lesion, shave biopsy of left pectoral and right deltoid raised lesions.  Sent off to pathology.

## 2023-06-27 ENCOUNTER — Telehealth: Payer: Self-pay | Admitting: *Deleted

## 2023-06-27 NOTE — Telephone Encounter (Signed)
Pt called stated that he had a missed call from [place].  I did not see any current messages.  Told him that if anything came up we would call him with the information.

## 2023-06-29 LAB — SURGICAL PATHOLOGY

## 2023-07-12 ENCOUNTER — Other Ambulatory Visit: Payer: Self-pay | Admitting: Nurse Practitioner

## 2023-07-31 ENCOUNTER — Other Ambulatory Visit: Payer: Self-pay | Admitting: Family Medicine

## 2023-07-31 DIAGNOSIS — M4716 Other spondylosis with myelopathy, lumbar region: Secondary | ICD-10-CM

## 2023-08-02 ENCOUNTER — Other Ambulatory Visit: Payer: Self-pay | Admitting: Nurse Practitioner

## 2023-08-02 ENCOUNTER — Other Ambulatory Visit: Payer: Self-pay | Admitting: Family Medicine

## 2023-08-02 DIAGNOSIS — R7989 Other specified abnormal findings of blood chemistry: Secondary | ICD-10-CM

## 2023-08-02 MED ORDER — TAMSULOSIN HCL 0.4 MG PO CAPS
0.4000 mg | ORAL_CAPSULE | Freq: Every day | ORAL | 3 refills | Status: DC
Start: 2023-08-02 — End: 2023-10-12

## 2023-08-02 NOTE — Telephone Encounter (Signed)
Refill placed, 90-day supply with refills.

## 2023-09-09 ENCOUNTER — Other Ambulatory Visit: Payer: Self-pay | Admitting: Family Medicine

## 2023-09-09 DIAGNOSIS — M4716 Other spondylosis with myelopathy, lumbar region: Secondary | ICD-10-CM

## 2023-09-25 NOTE — Telephone Encounter (Signed)
Good morning. This patient used to see me as PCP. You are now listed as primary care for them. Will you please fill this if appropriate. Thank you.  [name], NP

## 2023-09-26 ENCOUNTER — Ambulatory Visit: Payer: Medicare Other | Admitting: Family Medicine

## 2023-09-26 NOTE — Telephone Encounter (Signed)
Copied from CRM [id number]. Topic: Clinical - Prescription Issue >> Sep 26, 2023  3:17 PM Amy B wrote: Reason for CRM: Patient states he received a prescription for clobetasol cream (TEMOVATE) 0.05 % but he does not need it.  He uses the ointment but has a full tube.  He is going to take the medication back to the pharmacy as it is not covered by insurance.

## 2023-09-26 NOTE — Telephone Encounter (Signed)
FYI

## 2023-09-26 NOTE — Progress Notes (Unsigned)
   Established Patient Office Visit  Subjective   Patient ID: Ronald Parsons, male    DOB: 03/04/1956  Age: 67 y.o. MRN: [id number]  No chief complaint on file.   HPI  Moles-seborrheic keratoses, all 3  Skin rash-Nizoral?  Posterior scalp  Obesity-what is weight today?  How is his hip dislocation/replacement   The 10-year ASCVD risk score (Arnett DK, et al., 2019) is: 15.7%  Health Maintenance Due  Topic Date Due   Medicare Annual Wellness (AWV)  Never done   Hepatitis C Screening  Never done   DTaP/Tdap/Td (1 - Tdap) Never done   Pneumonia Vaccine 75+ Years old (1 of 1 - PCV) Never done   COVID-19 Vaccine (1 - 2023-24 season) Never done      Objective:     There were no vitals taken for this visit. {Vitals History (Optional):23777}  Physical Exam   No results found for any visits on 09/26/23.      Assessment & Plan:   There are no diagnoses linked to this encounter.   No follow-ups on file.    Sandre Kitty, MD

## 2023-10-11 ENCOUNTER — Other Ambulatory Visit: Payer: Self-pay | Admitting: Family Medicine

## 2023-10-11 ENCOUNTER — Other Ambulatory Visit: Payer: Self-pay | Admitting: Nurse Practitioner

## 2023-10-11 DIAGNOSIS — F5101 Primary insomnia: Secondary | ICD-10-CM

## 2023-10-11 DIAGNOSIS — M4716 Other spondylosis with myelopathy, lumbar region: Secondary | ICD-10-CM

## 2023-10-11 NOTE — Telephone Encounter (Signed)
Ronald Parsons. You are listed as this patient's PCP. I bet he needs to be seen.  [name]

## 2023-10-12 ENCOUNTER — Other Ambulatory Visit: Payer: Self-pay | Admitting: Family Medicine

## 2023-10-12 DIAGNOSIS — R7989 Other specified abnormal findings of blood chemistry: Secondary | ICD-10-CM

## 2023-10-12 DIAGNOSIS — I10 Essential (primary) hypertension: Secondary | ICD-10-CM

## 2023-10-12 MED ORDER — BISOPROLOL-HYDROCHLOROTHIAZIDE 2.5-6.25 MG PO TABS
ORAL_TABLET | ORAL | 3 refills | Status: DC
Start: 2023-10-12 — End: 2024-03-04

## 2023-10-12 MED ORDER — TAMSULOSIN HCL 0.4 MG PO CAPS
0.4000 mg | ORAL_CAPSULE | Freq: Every day | ORAL | 3 refills | Status: DC
Start: 2023-10-12 — End: 2023-12-26

## 2023-10-12 NOTE — Telephone Encounter (Signed)
Copied from CRM [id number]. Topic: General - Other >> Oct 12, 2023  9:13 AM Prudencio Pair wrote: Reason for CRM: Patient returning call. Checked encounters and advised that the call was in regards to nurse wanting to know if patient is still taking the Phentermine medication. Patient stated it is ok to call it in to pharmacy. States he took one yesterday and it was the first since August 20th. Patient also stated he called in 4 other prescriptions as well, Flomax, Celebrex, Bisoprolol-hydrochlorothiazide, & Ambien.

## 2023-10-12 NOTE — Telephone Encounter (Signed)
Called pt Ronald Parsons stating that we received an automatic refill request for Phentermine is he taking this medication at this time

## 2023-10-26 ENCOUNTER — Ambulatory Visit: Payer: Medicare Other

## 2023-10-26 DIAGNOSIS — Z0000 Encounter for general adult medical examination without abnormal findings: Secondary | ICD-10-CM

## 2023-10-26 NOTE — Progress Notes (Signed)
 Subjective:   Ronald Parsons is a 68 y.o. male who presents for Medicare Annual/Subsequent preventive examination.  Visit Complete: Virtual I connected with  Ronald Parsons on 10/26/23 by a audio enabled telemedicine application and verified that I am speaking with the correct person using two identifiers.  Interactive audio and video telecommunications were attempted between this provider and patient, however failed, due to patient having technical difficulties OR patient did not have access to video capability.  We continued and completed visit with audio only.   Patient Location: Home  Provider Location: Home Office  I discussed the limitations of evaluation and management by telemedicine. The patient expressed understanding and agreed to proceed.  Vital Signs: Because this visit was a virtual/telehealth visit, some criteria may be missing or patient reported. Any vitals not documented were not able to be obtained and vitals that have been documented are patient reported.    Cardiac Risk Factors include: advanced age (>19men, >88 women);hypertension;male gender     Objective:    Today's Vitals   10/26/23 1502  PainSc: 8    There is no height or weight on file to calculate BMI.     10/26/2023    3:15 PM 04/17/2023    7:22 PM 04/05/2023    5:07 PM 06/09/2020    9:40 AM 07/30/2013    2:30 PM 07/26/2013   12:32 PM  Advanced Directives  Does Patient Have a Medical Advance Directive? No No No No Patient does not have advance directive;Patient would not like information Patient does not have advance directive;Patient would like information  Would patient like information on creating a medical advance directive?    No - Patient declined  Advance directive packet given    Current Medications (verified) Outpatient Encounter Medications as of 10/26/2023  Medication Sig   bisoprolol -hydrochlorothiazide  (ZIAC ) 2.5-6.25 MG tablet TAKE 1/2 TO 1 TABLET BY MOUTH ONCE DAILY   clobetasol   cream (TEMOVATE ) 0.05 % USE AS DIRECTED 3 TIMES A DAY AS NEEDED FOR RASH.   clobetasol  cream (TEMOVATE ) 0.05 % Apply 1 Application topically 2 (two) times daily.   LORATADINE -D 12HR 5-120 MG tablet TAKE 1 TABLET BY MOUTH 2 TIMES DAILY.   NON FORMULARY balance of nature   oxyCODONE  (ROXICODONE ) 15 MG immediate release tablet TAKE 1/2 TABLET BY MOUTH ONCE DAILY AS NEEDED   SYRINGE-NEEDLE, DISP, 3 ML (B-D 3CC LUER-LOK SYR 22GX1-1/2) 22G X 1-1/2 3 ML MISC To use with testosterone  injections every 3 weeks   tamsulosin  (FLOMAX ) 0.4 MG CAPS capsule Take 1 capsule (0.4 mg total) by mouth daily.   testosterone  cypionate (DEPOTESTOSTERONE CYPIONATE) 200 MG/ML injection INJECT 0.5 ML INTO THE MUSCLE EVERY 21 DAYS.   triamcinolone  (NASACORT ) 55 MCG/ACT AERO nasal inhaler Place 2 sprays into the nose daily.   zolpidem  (AMBIEN ) 10 MG tablet TAKE 1 TABLET (10 MG TOTAL) BY MOUTH AT BEDTIME.   celecoxib  (CELEBREX ) 200 MG capsule TAKE 1 CAPSULE BY MOUTH 2 TIMES DAILY AS NEEDED FOR MILD PAIN OR MODERATE PAIN. (Patient not taking: Reported on 10/26/2023)   nitroGLYCERIN  (NITROSTAT ) 0.4 MG SL tablet Place 1 tablet (0.4 mg total) under the tongue every 5 (five) minutes as needed for chest pain. If you require more than two tablets five minutes apart go to the nearest ER via EMS.   phentermine (ADIPEX-P) 37.5 MG tablet TAKE 1 TABLET BY MOUTH DAILY (Patient not taking: Reported on 10/26/2023)   No facility-administered encounter medications on file as of 10/26/2023.    Allergies (  verified) Hydromorphone  hcl, Penicillins, and Lovastatin   History: Past Medical History:  Diagnosis Date   Asthma    allergy related   Cervical radiculitis    Depression    DISH (diffuse idiopathic skeletal hyperostosis)    Gout    Hyperlipidemia    Hypertension    Lumbago    Osteoarthritis    Past Surgical History:  Procedure Laterality Date   BACK SURGERY  10/24/1989   laminectomy   CARDIAC CATHETERIZATION     5-7 years ago,  states it was clear   COLONOSCOPY     HERNIA REPAIR     inguinal hernia repair as a baby on right groin   HIP SURGERY Right    01/2023   KNEE SURGERY Right    2023   RIGHT/LEFT HEART CATH AND CORONARY ANGIOGRAPHY N/A 06/09/2020   Procedure: RIGHT/LEFT HEART CATH AND CORONARY ANGIOGRAPHY;  Surgeon: Ladona Heinz, MD;  Location: [facility];  Service: Cardiovascular;  Laterality: N/A;   SHOULDER SURGERY Bilateral    2017   Family History  Problem Relation Age of Onset   Heart disease Father    Hypertension Father    Breast cancer Sister    Valvular heart disease Maternal Grandfather    Emphysema Paternal Grandfather    Social History   Socioeconomic History   Marital status: Married    Spouse name: Not on file   Number of children: Not on file   Years of education: Not on file   Highest education level: Not on file  Occupational History   Not on file  Tobacco Use   Smoking status: Former    Passive exposure: Past   Smokeless tobacco: Current    Types: Chew  Vaping Use   Vaping status: Never Used  Substance and Sexual Activity   Alcohol use: Yes    Comment: once monthly   Drug use: No   Sexual activity: Not Currently  Other Topics Concern   Not on file  Social History Narrative   Lives at home with wife.  Retired Youth Worker. Caffiene iced tea 1 glass daily.     Social Drivers of Corporate Investment Banker Strain: Low Risk  (10/26/2023)   Overall Financial Resource Strain (CARDIA)    Difficulty of Paying Living Expenses: Not hard at all  Food Insecurity: No Food Insecurity (10/26/2023)   Hunger Vital Sign    Worried About Running Out of Food in the Last Year: Never true    Ran Out of Food in the Last Year: Never true  Transportation Needs: No Transportation Needs (10/26/2023)   PRAPARE - Administrator, Civil Service (Medical): No    Lack of Transportation (Non-Medical): No  Physical Activity: Inactive (10/26/2023)   Exercise Vital Sign    Days of  Exercise per Week: 0 days    Minutes of Exercise per Session: 0 min  Stress: No Stress Concern Present (10/26/2023)   [employer] of Occupational Health - Occupational Stress Questionnaire    Feeling of Stress : Only a little  Social Connections: Moderately Integrated (10/26/2023)   Social Connection and Isolation Panel [NHANES]    Frequency of Communication with Friends and Family: More than three times a week    Frequency of Social Gatherings with Friends and Family: Once a week    Attends Religious Services: 1 to 4 times per year    Active Member of [employer] or Organizations: No    Attends Banker Meetings:  Never    Marital Status: Married    Tobacco Counseling Ready to quit: Not Answered Counseling given: Not Answered   Clinical Intake:  Pre-visit preparation completed: Yes  Pain : 0-10 Pain Score: 8  Pain Type: Chronic pain Pain Location: Hip (shoulder) Pain Orientation: Right Pain Descriptors / Indicators: Aching Pain Onset: More than a month ago Pain Frequency: Constant     Nutritional Risks: None Diabetes: No  How often do you need to have someone help you when you read instructions, pamphlets, or other written materials from your doctor or pharmacy?: 1 - Never  Interpreter Needed?: No  Information entered by :: NAllen LPN   Activities of Daily Living    10/26/2023    3:03 PM  In your present state of health, do you have any difficulty performing the following activities:  Hearing? 0  Vision? 0  Difficulty concentrating or making decisions? 1  Walking or climbing stairs? 1  Dressing or bathing? 0  Doing errands, shopping? 0  Preparing Food and eating ? N  Using the Toilet? N  In the past six months, have you accidently leaked urine? N  Do you have problems with loss of bowel control? N  Managing your Medications? N  Managing your Finances? N  Housekeeping or managing your Housekeeping? N    Patient Care Team: Chandra Toribio POUR, MD as  PCP - General (Family Medicine)  Indicate any recent Medical Services you may have received from other than [facility] providers in the past year (date may be approximate).     Assessment:   This is a routine wellness examination for [place].  Hearing/Vision screen Hearing Screening - Comments:: Denies hearing issues Vision Screening - Comments:: Regular eye exams, [facility]   Goals Addressed             This Visit's Progress    Patient Stated       10/26/2023, wants  hip to get better       Depression Screen    10/26/2023    3:19 PM 05/26/2023    9:06 AM 01/30/2023   10:46 AM 01/13/2023   11:15 AM 09/29/2022   10:41 AM 05/31/2022    2:51 PM 02/14/2022   10:50 AM  PHQ 2/9 Scores  PHQ - 2 Score 4 2 0 4 2 2 2   PHQ- 9 Score 7 9 0 12 7 7 5     Fall Risk    10/26/2023    3:16 PM 01/13/2023   11:15 AM 11/15/2021   10:21 AM  Fall Risk   Falls in the past year? 1 0 0  Comment hip came out of joint    Number falls in past yr: 0 0 0  Injury with Fall? 1 0 0  Risk for fall due to : History of fall(s);Impaired balance/gait;Impaired mobility;Medication side effect    Follow up Falls prevention discussed;Falls evaluation completed Falls evaluation completed     MEDICARE RISK AT HOME: Medicare Risk at Home Any stairs in or around the home?: Yes If so, are there any without handrails?: No Home free of loose throw rugs in walkways, pet beds, electrical cords, etc?: Yes Adequate lighting in your home to reduce risk of falls?: Yes Life alert?: No Use of a cane, walker or w/c?: No Grab bars in the bathroom?: Yes Shower chair or bench in shower?: No Elevated toilet seat or a handicapped toilet?: Yes  TIMED UP AND GO:  Was the test performed?  No  Cognitive Function:        10/26/2023    3:21 PM  6CIT Screen  What Year? 0 points  What month? 0 points  What time? 0 points  Count back from 20 0 points  Months in reverse 0 points  Repeat phrase 2 points  Total Score 2 points     Immunizations Immunization History  Administered Date(s) Administered   Influenza,inj,Quad PF,6+ Mos 07/31/2013   Influenza,inj,quad, With Preservative 07/24/2018   Zoster Recombinant(Shingrix) 07/09/2018, 10/02/2018    TDAP status: Due, Education has been provided regarding the importance of this vaccine. Advised may receive this vaccine at local pharmacy or Health Dept. Aware to provide a copy of the vaccination record if obtained from local pharmacy or Health Dept. Verbalized acceptance and understanding.  Flu Vaccine status: Due, Education has been provided regarding the importance of this vaccine. Advised may receive this vaccine at local pharmacy or Health Dept. Aware to provide a copy of the vaccination record if obtained from local pharmacy or Health Dept. Verbalized acceptance and understanding.  Pneumococcal vaccine status: Due, Education has been provided regarding the importance of this vaccine. Advised may receive this vaccine at local pharmacy or Health Dept. Aware to provide a copy of the vaccination record if obtained from local pharmacy or Health Dept. Verbalized acceptance and understanding.  Covid-19 vaccine status: Declined, Education has been provided regarding the importance of this vaccine but patient still declined. Advised may receive this vaccine at local pharmacy or Health Dept.or vaccine clinic. Aware to provide a copy of the vaccination record if obtained from local pharmacy or Health Dept. Verbalized acceptance and understanding.  Qualifies for Shingles Vaccine? Yes   Zostavax completed Yes   Shingrix Completed?: Yes  Screening Tests Health Maintenance  Topic Date Due   Pneumonia Vaccine 69+ Years old (1 of 2 - PCV) Never done   Hepatitis C Screening  Never done   DTaP/Tdap/Td (1 - Tdap) Never done   COVID-19 Vaccine (1 - 2024-25 season) 11/11/2023 (Originally 06/25/2023)   INFLUENZA VACCINE  01/22/2024 (Originally 05/25/2023)   Medicare Annual Wellness  (AWV)  10/25/2024   Colonoscopy  04/08/2030   Zoster Vaccines- Shingrix  Completed   HPV VACCINES  Aged Out    Health Maintenance  Health Maintenance Due  Topic Date Due   Pneumonia Vaccine 46+ Years old (1 of 2 - PCV) Never done   Hepatitis C Screening  Never done   DTaP/Tdap/Td (1 - Tdap) Never done    Colorectal cancer screening: states he was supposed to have one in 2021  Lung Cancer Screening: (Low Dose CT Chest recommended if Age 34-80 years, 20 pack-year currently smoking OR have quit w/in 15years.) does not qualify.   Lung Cancer Screening Referral: no  Additional Screening:  Hepatitis C Screening: does qualify;   Vision Screening: Recommended annual ophthalmology exams for early detection of glaucoma and other disorders of the eye. Is the patient up to date with their annual eye exam?  Yes  Who is the provider or what is the name of the office in which the patient attends annual eye exams? [facility] If pt is not established with a provider, would they like to be referred to a provider to establish care? No .   Dental Screening: Recommended annual dental exams for proper oral hygiene  Diabetic Foot Exam: n/a  Community Resource Referral / Chronic Care Management: CRR required this visit?  No   CCM required this visit?  No  Plan:     I have personally reviewed and noted the following in the patient's chart:   Medical and social history Use of alcohol, tobacco or illicit drugs  Current medications and supplements including opioid prescriptions. Patient is currently taking opioid prescriptions. Information provided to patient regarding non-opioid alternatives. Patient advised to discuss non-opioid treatment plan with their provider. Functional ability and status Nutritional status Physical activity Advanced directives List of other physicians Hospitalizations, surgeries, and ER visits in previous 12 months Vitals Screenings to include cognitive,  depression, and falls Referrals and appointments  In addition, I have reviewed and discussed with patient certain preventive protocols, quality metrics, and best practice recommendations. A written personalized care plan for preventive services as well as general preventive health recommendations were provided to patient.     Ardella FORBES Dawn, LPN   05/30/7973   After Visit Summary: (Pick Up) Due to this being a telephonic visit, with patients personalized plan was offered to patient and patient has requested to Pick up at office.  Nurse Notes: none

## 2023-10-26 NOTE — Patient Instructions (Addendum)
 Mr. Bowlby , Thank you for taking time to come for your Medicare Wellness Visit. I appreciate your ongoing commitment to your health goals. Please review the following plan we discussed and let me know if I can assist you in the future.   Referrals/Orders/Follow-Ups/Clinician Recommendations: none  Managing Pain Without Opioids Opioids are strong medicines used to treat moderate to severe pain. For some people, especially those who have long-term (chronic) pain, opioids may not be the best choice for pain management due to: Side effects like nausea, constipation, and sleepiness. The risk of addiction (opioid use disorder). The longer you take opioids, the greater your risk of addiction. Pain that lasts for more than 3 months is called chronic pain. Managing chronic pain usually requires more than one approach and is often provided by a team of health care providers working together (multidisciplinary approach). Pain management may be done at a pain management center or pain clinic. How to manage pain without the use of opioids Use non-opioid medicines Non-opioid medicines for pain may include: Over-the-counter or prescription non-steroidal anti-inflammatory drugs (NSAIDs). These may be the first medicines used for pain. They work well for muscle and bone pain, and they reduce swelling. Acetaminophen . This over-the-counter medicine may work well for milder pain but not swelling. Antidepressants. These may be used to treat chronic pain. A certain type of antidepressant (tricyclics) is often used. These medicines are given in lower doses for pain than when used for depression. Anticonvulsants. These are usually used to treat seizures but may also reduce nerve (neuropathic) pain. Muscle relaxants. These relieve pain caused by sudden muscle tightening (spasms). You may also use a pain medicine that is applied to the skin as a patch, cream, or gel (topical analgesic), such as a numbing medicine. These may  cause fewer side effects than medicines taken by mouth. Do certain therapies as directed Some therapies can help with pain management. They include: Physical therapy. You will do exercises to gain strength and flexibility. A physical therapist may teach you exercises to move and stretch parts of your body that are weak, stiff, or painful. You can learn these exercises at physical therapy visits and practice them at home. Physical therapy may also involve: Massage. Heat wraps or applying heat or cold to affected areas. Electrical signals that interrupt pain signals (transcutaneous electrical nerve stimulation, TENS). Weak lasers that reduce pain and swelling (low-level laser therapy). Signals from your body that help you learn to regulate pain (biofeedback). Occupational therapy. This helps you to learn ways to function at home and work with less pain. Recreational therapy. This involves trying new activities or hobbies, such as a physical activity or drawing. Mental health therapy, including: Cognitive behavioral therapy (CBT). This helps you learn coping skills for dealing with pain. Acceptance and commitment therapy (ACT) to change the way you think and react to pain. Relaxation therapies, including muscle relaxation exercises and mindfulness-based stress reduction. Pain management counseling. This may be individual, family, or group counseling.  Receive medical treatments Medical treatments for pain management include: Nerve block injections. These may include a pain blocker and anti-inflammatory medicines. You may have injections: Near the spine to relieve chronic back or neck pain. Into joints to relieve back or joint pain. Into nerve areas that supply a painful area to relieve body pain. Into muscles (trigger point injections) to relieve some painful muscle conditions. A medical device placed near your spine to help block pain signals and relieve nerve pain or chronic back pain (spinal  cord stimulation device). Acupuncture. Follow these instructions at home Medicines Take over-the-counter and prescription medicines only as told by your health care provider. If you are taking pain medicine, ask your health care providers about possible side effects to watch out for. Do not drive or use heavy machinery while taking prescription opioid pain medicine. Lifestyle  Do not use drugs or alcohol to reduce pain. If you drink alcohol, limit how much you have to: 0-1 drink a day for women who are not pregnant. 0-2 drinks a day for men. Know how much alcohol is in a drink. In the [place] one drink equals one 12 oz bottle of beer (355 mL), one 5 oz glass of wine (148 mL), or one 1 oz glass of hard liquor (44 mL). Do not use any products that contain nicotine or tobacco. These products include cigarettes, chewing tobacco, and vaping devices, such as e-cigarettes. If you need help quitting, ask your health care provider. Eat a healthy diet and maintain a healthy weight. Poor diet and excess weight may make pain worse. Eat foods that are high in fiber. These include fresh fruits and vegetables, whole grains, and beans. Limit foods that are high in fat and processed sugars, such as fried and sweet foods. Exercise regularly. Exercise lowers stress and may help relieve pain. Ask your health care provider what activities and exercises are safe for you. If your health care provider approves, join an exercise class that combines movement and stress reduction. Examples include yoga and tai chi. Get enough sleep. Lack of sleep may make pain worse. Lower stress as much as possible. Practice stress reduction techniques as told by your therapist. General instructions Work with all your pain management providers to find the treatments that work best for you. You are an important member of your pain management team. There are many things you can do to reduce pain on your own. Consider joining an online or  in-person support group for people who have chronic pain. Keep all follow-up visits. This is important. Where to find more information You can find more information about managing pain without opioids from: American Academy of Pain Medicine: painmed.org Institute for Chronic Pain: instituteforchronicpain.org American Chronic Pain Association: theacpa.org Contact a health care provider if: You have side effects from pain medicine. Your pain gets worse or does not get better with treatments or home therapy. You are struggling with anxiety or depression. Summary Many types of pain can be managed without opioids. Chronic pain may respond better to pain management without opioids. Pain is best managed when you and a team of health care providers work together. Pain management without opioids may include non-opioid medicines, medical treatments, physical therapy, mental health therapy, and lifestyle changes. Tell your health care providers if your pain gets worse or is not being managed well enough. This information is not intended to replace advice given to you by your health care provider. Make sure you discuss any questions you have with your health care provider. Document Revised: 01/20/2021 Document Reviewed: 01/20/2021 Elsevier Patient Education  2024 [employer].   This is a list of the screening recommended for you and due dates:  Health Maintenance  Topic Date Due   Pneumonia Vaccine (1 of 2 - PCV) Never done   Hepatitis C Screening  Never done   DTaP/Tdap/Td vaccine (1 - Tdap) Never done   COVID-19 Vaccine (1 - 2024-25 season) 11/11/2023*   Flu Shot  01/22/2024*   Medicare Annual Wellness Visit  10/25/2024  Colon Cancer Screening  04/08/2030   Zoster (Shingles) Vaccine  Completed   HPV Vaccine  Aged Out  *Topic was postponed. The date shown is not the original due date.    Advanced directives: (ACP Link)Information on Advanced Care Planning can be found at [place]   Secretary of [facility] Advance Health Care Directives Advance Health Care Directives ([email or web address]   Next Medicare Annual Wellness Visit scheduled for next year: Yes  insert Preventive Care attachment Insert FALL PREVENTION attachment if needed

## 2023-10-26 NOTE — Telephone Encounter (Signed)
 Copied from CRM [id number]. Topic: General - Call Back - No Documentation >> Oct 26, 2023  2:59 PM Joanette Gula wrote: Returning a phone call      No one in office has call this patient it was the department that does [employer]

## 2023-11-15 ENCOUNTER — Ambulatory Visit: Payer: Medicare Other | Admitting: Family Medicine

## 2023-11-15 NOTE — Progress Notes (Deleted)
   Established Patient Office Visit  Subjective   Patient ID: Ronald Parsons, male    DOB: 05/11/1956  Age: 68 y.o. MRN: [id number]  No chief complaint on file.   HPI  Biopsies: Right rear deltoid-SK Right scapular-hypertrophic AK.  Needs cryo Left pec-SK  Weight-phentermine.  Calorie counting?  Scalp rash-Nizoral?   The ASCVD Risk score (Arnett DK, et al., 2019) failed to calculate for the following reasons:   The systolic blood pressure is missing  Health Maintenance Due  Topic Date Due   Pneumonia Vaccine 42+ Years old (1 of 2 - PCV) Never done   Hepatitis C Screening  Never done   DTaP/Tdap/Td (1 - Tdap) Never done   COVID-19 Vaccine (1 - 2024-25 season) Never done      Objective:     There were no vitals taken for this visit. {Vitals History (Optional):23777}  Physical Exam   No results found for any visits on 11/15/23.      Assessment & Plan:   There are no diagnoses linked to this encounter.   No follow-ups on file.    Sandre Kitty, MD

## 2023-11-17 ENCOUNTER — Ambulatory Visit: Payer: Self-pay | Admitting: Family Medicine

## 2023-11-17 NOTE — Telephone Encounter (Signed)
Copied from CRM [id number]. Topic: Appointments - Appointment Scheduling >> Nov 17, 2023  1:53 PM Ronald Parsons wrote: Patient/patient representative is calling to schedule an appointment. Refer to attachments for appointment information.  Patient is sick with fluid in lungs  Chief Complaint: SOB Symptoms: SOB, dry cough, wheezing Frequency: Since Wednesday Pertinent Negatives: Patient denies fever Disposition: [] ED /[] Urgent Care (no appt availability in office) / [x] Appointment(In office/virtual)/ []  [facility]/ [] Home Care/ [x] Refused Recommended Disposition /[] [facility]/ []  Follow-up with PCP Additional Notes: Patient called in to report SOB. Patient also reported a dry cough and wheezing. Patient stated SOB is present all the time, but was able to speak in full and complete sentences on the phone with this RN. Patient denied fever. Patient stated his dad recently had pneumonia and patient is concerned that he may have contracted it. Patient reported he has been using an albuterol inhaler with no relief. Advised patient to see a provider within 4 hours. No availability in patient's current PCP office until March. This RN attempted to assist patient in locating an appointment with a provider at a different office. Patient became frustrated and hung up the phone.   Reason for Disposition  [1] MILD difficulty breathing (e.g., minimal/no SOB at rest, SOB with walking, pulse <100) AND [2] NEW-onset or WORSE than normal  Answer Assessment - Initial Assessment Questions 1. RESPIRATORY STATUS: "Describe your breathing?" (e.g., wheezing, shortness of breath, unable to speak, severe coughing)      Describes SOB, dry cough, and wheezing  2. ONSET: "When did this breathing problem begin?"      Wednesday  3. PATTERN "Does the difficult breathing come and go, or has it been constant since it started?"      Constant  4. SEVERITY: "How bad is your breathing?" (e.g., mild, moderate,  severe)    - MILD: No SOB at rest, mild SOB with walking, speaks normally in sentences, can lie down, no retractions, pulse < 100.    - MODERATE: SOB at rest, SOB with minimal exertion and prefers to sit, cannot lie down flat, speaks in phrases, mild retractions, audible wheezing, pulse 100-120.    - SEVERE: Very SOB at rest, speaks in single words, struggling to breathe, sitting hunched forward, retractions, pulse > 120      Mild-moderate, states he feels SOB all the time, able to speak in complete sentences on the phone with this RN  5. RECURRENT SYMPTOM: "Have you had difficulty breathing before?" If Yes, ask: "When was the last time?" and "What happened that time?"      Yes, but cannot recall the last time this happened  6. CARDIAC HISTORY: "Do you have any history of heart disease?" (e.g., heart attack, angina, bypass surgery, angioplasty)      Denies  7. LUNG HISTORY: "Do you have any history of lung disease?"  (e.g., pulmonary embolus, asthma, emphysema)     Allergy related asthma  8. CAUSE: "What do you think is causing the breathing problem?"      States his dad has pneumonia   9. OTHER SYMPTOMS: "Do you have any other symptoms? (e.g., dizziness, runny nose, cough, chest pain, fever)     Dry cough, denies fever, denies runny nose  10. O2 SATURATION MONITOR:  "Do you use an oxygen saturation monitor (pulse oximeter) at home?" If Yes, ask: "What is your reading (oxygen level) today?" "What is your usual oxygen saturation reading?" (e.g., 95%)       Denies  Protocols used: Breathing Difficulty-A-AH

## 2023-11-20 ENCOUNTER — Other Ambulatory Visit: Payer: Self-pay | Admitting: Family Medicine

## 2023-11-20 ENCOUNTER — Other Ambulatory Visit: Payer: Self-pay | Admitting: Nurse Practitioner

## 2023-11-20 DIAGNOSIS — R7989 Other specified abnormal findings of blood chemistry: Secondary | ICD-10-CM

## 2023-11-20 NOTE — Telephone Encounter (Signed)
Can you call the patient to see if he would like to be seen tomorrow or Wednesday?.  I have some openings

## 2023-11-21 NOTE — Progress Notes (Deleted)
   Established Patient Office Visit  Subjective   Patient ID: MADDOCK FINIGAN, male    DOB: 01-02-1956  Age: 68 y.o. MRN: [id number]  No chief complaint on file.   HPI  Right scapula needs cryo  Copmlaint of sob.     The ASCVD Risk score (Arnett DK, et al., 2019) failed to calculate for the following reasons:   The systolic blood pressure is missing  Health Maintenance Due  Topic Date Due   Pneumonia Vaccine 38+ Years old (1 of 2 - PCV) Never done   Hepatitis C Screening  Never done   DTaP/Tdap/Td (1 - Tdap) Never done   COVID-19 Vaccine (1 - 2024-25 season) Never done      Objective:     There were no vitals taken for this visit. {Vitals History (Optional):23777}  Physical Exam   No results found for any visits on 11/22/23.      Assessment & Plan:   There are no diagnoses linked to this encounter.   No follow-ups on file.    Sandre Kitty, MD

## 2023-11-21 NOTE — Telephone Encounter (Signed)
Pt went to see someone about his breathing over the weekend but he is going to come tomorrow for his 3 month f/u that he missed.

## 2023-11-22 ENCOUNTER — Ambulatory Visit: Payer: Medicare Other | Admitting: Family Medicine

## 2023-11-27 NOTE — Telephone Encounter (Unsigned)
Copied from CRM [id number]. Topic: Clinical - Medication Question >> Nov 27, 2023  2:35 PM Archie Patten S wrote: Reason for CRM: Patient was diagnosed in upper respiratory infection for 2 weeks at Urgent Care in [place] on 11/18/2023. amoxicillin not working. callback number [phone]

## 2023-11-28 ENCOUNTER — Encounter: Payer: Self-pay | Admitting: Family Medicine

## 2023-11-28 ENCOUNTER — Ambulatory Visit (INDEPENDENT_AMBULATORY_CARE_PROVIDER_SITE_OTHER): Payer: Medicare Other | Admitting: Family Medicine

## 2023-11-28 VITALS — BP 114/74 | HR 93 | Temp 98.6°F | Ht 70.0 in | Wt 257.8 lb

## 2023-11-28 DIAGNOSIS — R062 Wheezing: Secondary | ICD-10-CM

## 2023-11-28 DIAGNOSIS — J019 Acute sinusitis, unspecified: Secondary | ICD-10-CM | POA: Diagnosis not present

## 2023-11-28 DIAGNOSIS — B9689 Other specified bacterial agents as the cause of diseases classified elsewhere: Secondary | ICD-10-CM

## 2023-11-28 DIAGNOSIS — R051 Acute cough: Secondary | ICD-10-CM

## 2023-11-28 MED ORDER — LEVOFLOXACIN 750 MG PO TABS
750.0000 mg | ORAL_TABLET | Freq: Every day | ORAL | 0 refills | Status: DC
Start: 2023-11-28 — End: 2024-03-04

## 2023-11-28 MED ORDER — ALBUTEROL SULFATE HFA 108 (90 BASE) MCG/ACT IN AERS
2.0000 | INHALATION_SPRAY | Freq: Four times a day (QID) | RESPIRATORY_TRACT | 3 refills | Status: AC | PRN
Start: 2023-11-28 — End: ?

## 2023-11-28 NOTE — Patient Instructions (Addendum)
 I will send a different antibiotic called levofloxacin  as well as a refill of your albuterol  inhaler to the pharmacy.  Please have the chest x-ray done tomorrow to ensure that we are not missing any other conditions that need to be treated.  [facility] [address] [place], [place] [address] Phone [phone]  You can discuss the nebulizer solution with Dr. Chandra at your next appointment to see if he is comfortable prescribing this for you.

## 2023-11-28 NOTE — Progress Notes (Signed)
   Acute Office Visit  Subjective:     Patient ID: Ronald Parsons, male    DOB: May 15, 1956, 68 y.o.   MRN: [id number]  Chief Complaint  Patient presents with   Follow Up URI from UC    HPI Patient is in today for URI symptoms.  He went to the urgent care last week and was prescribed a course of prednisone, benzonatate, an albuterol  inhaler, and doxycycline.  He finished the course of doxycycline yesterday and feels that today has been the best day he has had, but symptoms have not completely resolved.  He is still experiencing a mostly dry but occasionally productive cough as well as rhinorrhea, sinus pressure, and painful teeth worse on the right than the left.  He also notes that it has been difficult to breathe when laying flat for the past several weeks.  Symptoms have been ongoing for 3 weeks total at this time with little improvement.  ROS See HPI     Objective:    BP 114/74   Pulse 93   Temp 98.6 F (37 C) (Oral)   Ht 5' 10 (1.778 m)   Wt 257 lb 12 oz (116.9 kg)   SpO2 93%   BMI 36.98 kg/m   Physical Exam Constitutional:      General: He is not in acute distress.    Appearance: Normal appearance.  HENT:     Head: Normocephalic and atraumatic.  Cardiovascular:     Rate and Rhythm: Normal rate and regular rhythm.     Heart sounds: Normal heart sounds. No murmur heard.    No friction rub. No gallop.  Pulmonary:     Effort: Pulmonary effort is normal. No respiratory distress.     Breath sounds: Wheezing (Throughout lung fields) present. No rhonchi or rales.  Skin:    General: Skin is warm and dry.  Neurological:     Mental Status: He is alert and oriented to person, place, and time.  Psychiatric:        Mood and Affect: Mood normal.      Assessment & Plan:  Wheezing -     DG Chest 2 View; Future -     Albuterol  Sulfate HFA; Inhale 2 puffs into the lungs every 6 (six) hours as needed for wheezing.  Dispense: 2 each; Refill: 3  Acute cough -     DG Chest 2  View; Future  Acute bacterial rhinosinusitis -     levoFLOXacin ; Take 1 tablet (750 mg total) by mouth daily.  Dispense: 7 tablet; Refill: 0  Given patient's penicillin allergy and failure of course of doxycycline, initiate course of levofloxacin  to cover for acute bacterial rhinosinusitis in addition to possible pneumonia.  He is at an increased risk for pneumonia and has not received his pneumonia vaccine yet.  Obtaining chest x-ray to rule out pneumonia and start evaluation for possible COPD given his symptoms and history of smoking.  There may also be some element of heart failure present if he continues to experience shortness of breath when laying flat even after course of levofloxacin , recommend to discuss this at his next appointment with his PCP.  Also sending refill of albuterol  inhaler to help with wheezing.  Will adjust management further if indicated by chest x-ray results.  Return if symptoms worsen or fail to improve.  Ronald DELENA Sear, PA

## 2023-12-01 ENCOUNTER — Other Ambulatory Visit: Payer: Self-pay | Admitting: Family Medicine

## 2023-12-05 ENCOUNTER — Other Ambulatory Visit: Payer: Self-pay | Admitting: Family Medicine

## 2023-12-05 DIAGNOSIS — M4716 Other spondylosis with myelopathy, lumbar region: Secondary | ICD-10-CM

## 2023-12-26 ENCOUNTER — Encounter: Payer: Self-pay | Admitting: Family Medicine

## 2023-12-26 ENCOUNTER — Ambulatory Visit (INDEPENDENT_AMBULATORY_CARE_PROVIDER_SITE_OTHER): Payer: Medicare Other | Admitting: Family Medicine

## 2023-12-26 VITALS — BP 104/66 | HR 70 | Ht 70.0 in | Wt 254.8 lb

## 2023-12-26 DIAGNOSIS — M4716 Other spondylosis with myelopathy, lumbar region: Secondary | ICD-10-CM | POA: Diagnosis not present

## 2023-12-26 DIAGNOSIS — R7989 Other specified abnormal findings of blood chemistry: Secondary | ICD-10-CM | POA: Diagnosis not present

## 2023-12-26 DIAGNOSIS — N401 Enlarged prostate with lower urinary tract symptoms: Secondary | ICD-10-CM

## 2023-12-26 DIAGNOSIS — R3911 Hesitancy of micturition: Secondary | ICD-10-CM

## 2023-12-26 DIAGNOSIS — D229 Melanocytic nevi, unspecified: Secondary | ICD-10-CM

## 2023-12-26 DIAGNOSIS — N400 Enlarged prostate without lower urinary tract symptoms: Secondary | ICD-10-CM | POA: Diagnosis not present

## 2023-12-26 DIAGNOSIS — D225 Melanocytic nevi of trunk: Secondary | ICD-10-CM | POA: Diagnosis not present

## 2023-12-26 DIAGNOSIS — D751 Secondary polycythemia: Secondary | ICD-10-CM

## 2023-12-26 MED ORDER — TAMSULOSIN HCL 0.4 MG PO CAPS
0.4000 mg | ORAL_CAPSULE | Freq: Two times a day (BID) | ORAL | 3 refills | Status: AC
Start: 2023-12-26 — End: ?

## 2023-12-26 MED ORDER — OXYCODONE HCL 15 MG PO TABS
ORAL_TABLET | ORAL | 0 refills | Status: DC
Start: 2023-12-26 — End: 2024-03-27

## 2023-12-26 NOTE — Patient Instructions (Addendum)
 It was nice to see you today,  We addressed the following topics today: -You will need to schedule an appointment so we can check your labs including testosterone labs and cholesterol levels.  This will be fasting. - If you continue to have issues with taste and smell let us know and I can send you to a ear nose and throat doctor to see if there is any additional treatments I can offer for your sinuses - I will rewrite your Flomax dosing as twice a day and resend that.  Have a great day,  Frederic Jericho, MD

## 2023-12-26 NOTE — Assessment & Plan Note (Signed)
 Biopsies to her SKs and 1 on the right scapula was an AK that involve the margins.  Patient wished to have these cryotherapied off in addition to some other lesions.  We discussed possibility of getting an excisional biopsy for the AK but patient would like to proceed with cryotherapy.  Cryotherapy was performed on the 3 lesions on the back, smaller circular papular lesions on the left medial thigh and right elbow.

## 2023-12-26 NOTE — Assessment & Plan Note (Signed)
 Increasing to 0.4 mg twice daily.  Patient has already done this on his own and noticed improvement in efficacy.  Repeating PSA at next visit.

## 2023-12-26 NOTE — Progress Notes (Signed)
 Cryotherapy Procedure Note  Pre-operative Diagnosis: Actinic keratosis, seborrheic keratoses  Post-operative Diagnosis: same  Locations: Upper posterior torso (left-sided and right-sided), right posterior elbow, medial left thigh.  Indications: Irritation, treatment of potentially precancerous lesions  Anesthesia: not required   Procedure Details  History of allergy to iodine: no. Pacemaker? no.  Patient informed of risks (permanent scarring, infection, light or dark discoloration, bleeding, infection, weakness, numbness and recurrence of the lesion) and benefits of the procedure and verbal informed consent obtained.  The areas are treated with liquid nitrogen therapy, frozen until ice ball extended 3 mm beyond lesion, allowed to thaw, and treated again. The patient tolerated procedure well.  The patient was instructed on post-op care, warned that there may be blister formation, redness and pain. Recommend OTC analgesia as needed for pain.  Condition: Stable  Complications: none.  Plan: 1. Instructed to keep the area dry and covered for 24-48h and clean thereafter. 2. Warning signs of infection were reviewed.   3. Recommended that the patient use OTC analgesics as needed for pain.  4. Return in 3 month.

## 2023-12-26 NOTE — Progress Notes (Signed)
 Established Patient Office Visit  Subjective   Patient ID: Ronald Parsons, male    DOB: [birth year]  Age: 68 y.o. MRN: [id number]  Chief Complaint  Patient presents with   Medical Management of Chronic Issues    HPI  Hip dislocation-patient now for physical therapy.  Will begin that sometime soon.  Hypogonadism-patient takes his testosterone 0.7 mL every 3 weeks.  Last took it about 2 weeks ago.  We discussed repeating lab testing.  Patient complains of loss of taste and smell since getting a infection about a month ago.  COVID-negative.  Takes daily over-the-counter antihistamines and Flonase.  Patient states that the Flomax is not working as effectively so he started taking it twice a day morning and night.  This has improved the effectiveness.  No concerns now.  Patient wanted some of the SKs on his skin frozen off.  We discussed the pathology results from the previous biopsy showing 2 SKs and an AK that involved the margins.  We discussed getting an excisional biopsy for more definitive treatment.  Patient prefers cryotherapy today.  The 10-year ASCVD risk score (Arnett DK, et al., 2019) is: 11.4%  Health Maintenance Due  Topic Date Due   Pneumonia Vaccine 69+ Years old (1 of 2 - PCV) Never done   Hepatitis C Screening  Never done   DTaP/Tdap/Td (1 - Tdap) Never done   COVID-19 Vaccine (1 - 2024-25 season) Never done      Objective:     BP 104/66   Pulse 70   Ht 5\' 10"  (1.778 m)   Wt 254 lb 12.8 oz (115.6 kg)   SpO2 97%   BMI 36.56 kg/m    Physical Exam General: Alert, oriented CV: Regular rhythm Pulmonary: Lungs provide Skin: Multiple seborrheic keratoses on the extremities and posterior torso of varying sizes.  Lesions on the extremities generally smaller and more circumscribed.   No results found for any visits on 12/26/23.      Assessment & Plan:   Enlarged skin mole Assessment & Plan: Biopsies to her SKs and 1 on the right scapula was an AK  that involve the margins.  Patient wished to have these cryotherapied off in addition to some other lesions.  We discussed possibility of getting an excisional biopsy for the AK but patient would like to proceed with cryotherapy.  Cryotherapy was performed on the 3 lesions on the back, smaller circular papular lesions on the left medial thigh and right elbow.   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  Currently takes 0.7 mL every 3 weeks.  Orders: -     Tamsulosin HCl; Take 1 capsule (0.4 mg total) by mouth in the morning and at bedtime.  Dispense: 180 capsule; Refill: 3 -     Testosterone; Future -     Estrogens, total; Future -     PSA; Future -     Lipid panel; Future -     Comprehensive metabolic panel; Future  Hypertrophy of prostate Assessment & Plan: Increasing to 0.4 mg twice daily.  Patient has already done this on his own and noticed improvement in efficacy.  Repeating PSA at next visit.   Polycythemia Assessment & Plan: Possibly related to untreated  obstructive sleep apnea or testosterone therapy or combination of the pellets.  Rechecking testosterone.  Will continue to discuss OSA testing with the patient.  Will get CBC and epo level  Orders: -     Erythropoietin; Future -     CBC with Differential/Platelet; Future  Benign prostatic hyperplasia with urinary hesitancy -     PSA; Future     Return in about 3 months (around 03/27/2024) for hld, testosterone, weight.    Sandre Kitty, MD

## 2023-12-26 NOTE — Assessment & Plan Note (Signed)
 Possibly related to untreated obstructive sleep apnea or testosterone therapy or combination of the pellets.  Rechecking testosterone.  Will continue to discuss OSA testing with the patient.  Will get CBC and epo level

## 2023-12-26 NOTE — Assessment & Plan Note (Signed)
 Will recheck testosterone level, CBC, CMP, lipid panel, estrogen, PSA as part of routine yearly checks.  Currently takes 0.7 mL every 3 weeks.

## 2023-12-27 ENCOUNTER — Telehealth: Payer: Self-pay

## 2023-12-27 NOTE — Telephone Encounter (Signed)
 Called patient Ronald Parsons advising him that his paperwork is ready for pick up

## 2023-12-27 NOTE — Telephone Encounter (Signed)
 Patient dropped off document Handicap Placard, to be filled out by provider. Patient requested to send it back via Call Patient to pick up within 5-days. Document is located in providers tray. Please advise at Mobile [phone] (mobile)

## 2023-12-27 NOTE — Telephone Encounter (Signed)
 It's ready for him to pick up now.

## 2023-12-27 NOTE — Telephone Encounter (Signed)
 Clifford thanks

## 2024-01-03 ENCOUNTER — Ambulatory Visit (INDEPENDENT_AMBULATORY_CARE_PROVIDER_SITE_OTHER): Admitting: Family Medicine

## 2024-01-03 ENCOUNTER — Encounter: Payer: Self-pay | Admitting: Family Medicine

## 2024-01-03 ENCOUNTER — Other Ambulatory Visit

## 2024-01-03 VITALS — BP 100/64 | HR 77 | Ht 70.0 in | Wt 248.0 lb

## 2024-01-03 DIAGNOSIS — Z6834 Body mass index (BMI) 34.0-34.9, adult: Secondary | ICD-10-CM

## 2024-01-03 DIAGNOSIS — R7989 Other specified abnormal findings of blood chemistry: Secondary | ICD-10-CM

## 2024-01-03 DIAGNOSIS — R300 Dysuria: Secondary | ICD-10-CM | POA: Diagnosis not present

## 2024-01-03 DIAGNOSIS — I10 Essential (primary) hypertension: Secondary | ICD-10-CM | POA: Diagnosis not present

## 2024-01-03 DIAGNOSIS — D751 Secondary polycythemia: Secondary | ICD-10-CM

## 2024-01-03 DIAGNOSIS — N401 Enlarged prostate with lower urinary tract symptoms: Secondary | ICD-10-CM

## 2024-01-03 LAB — POCT URINALYSIS DIP (CLINITEK)
Bilirubin, UA: NEGATIVE
Blood, UA: NEGATIVE
Glucose, UA: NEGATIVE mg/dL
Ketones, POC UA: NEGATIVE mg/dL
Leukocytes, UA: NEGATIVE
Nitrite, UA: NEGATIVE
POC PROTEIN,UA: NEGATIVE
Spec Grav, UA: 1.025 (ref 1.010–1.025)
Urobilinogen, UA: 0.2 U/dL
pH, UA: 6 (ref 5.0–8.0)

## 2024-01-03 NOTE — Progress Notes (Signed)
   Established Patient Office Visit  Subjective   Patient ID: Ronald Parsons, male    DOB: 1956-03-02  Age: 68 y.o. MRN: [id number]  Chief Complaint  Patient presents with   Medical Management of Chronic Issues    HPI  Patient wishes to discuss weight loss medications, particularly GLP-1 medications.  We discussed how this will come down to if his insurance covers it or not.  Advised him if they do not cover it he can try getting it directly through the manufacturer but this will likely cause around $500 a month  Patient taking the lowest dose of his blood pressure medication.  Taking as a full tablet.  Feels like his blood sugar has been a little bit lower recently.  Not symptomatic with these low blood pressures.  Discussed checking blood pressure at home and decreasing to half a tablet if needed.   The 10-year ASCVD risk score (Arnett DK, et al., 2019) is: 10.6%  Health Maintenance Due  Topic Date Due   Pneumonia Vaccine 30+ Years old (1 of 2 - PCV) Never done   Hepatitis C Screening  Never done   DTaP/Tdap/Td (1 - Tdap) Never done   COVID-19 Vaccine (1 - 2024-25 season) Never done      Objective:     BP 100/64   Pulse 77   Ht 5\' 10"  (1.778 m)   Wt 248 lb (112.5 kg)   SpO2 97%   BMI 35.58 kg/m    Physical Exam General: Alert, oriented Pulmonary: No respiratory distress Skin: Previously treated seborrheic keratoses on the scapula and thigh starting to scab off.   Results for orders placed or performed in visit on 01/03/24  POCT URINALYSIS DIP (CLINITEK)  Result Value Ref Range   Color, UA yellow yellow   Clarity, UA clear clear   Glucose, UA negative negative mg/dL   Bilirubin, UA negative negative   Ketones, POC UA negative negative mg/dL   Spec Grav, UA 4.132 4.401 - 1.025   Blood, UA negative negative   pH, UA 6.0 5.0 - 8.0   POC PROTEIN,UA negative negative, trace   Urobilinogen, UA 0.2 0.2 or 1.0 E.U./dL   Nitrite, UA Negative Negative   Leukocytes,  UA Negative Negative        Assessment & Plan:   Dysuria Assessment & Plan: Complaining of weakened stream.  Would like his urine checked for infection.  Urine dipstick was normal.  Orders: -     POCT URINALYSIS DIP (CLINITEK)  Body mass index (BMI) of 34.0-34.9 in adult Assessment & Plan: Advised patient to check with his insurance company regarding eligibility for the GLP-1's for obesity.  Also advised him to check out manufacture website directly if it is not covered by insurance.  Patient has limited ability to exercise due to hip replacement.  Has hypertension, low testosterone.  No history of ASCVD.   Essential hypertension Assessment & Plan: Blood pressure has been on the low end of normal the last few times.  Takes the lowest dose of bisoprolol-HCTZ.  Advised him to check at home and if continues to be low to try a half a tablet.      No follow-ups on file.    Sandre Kitty, MD

## 2024-01-03 NOTE — Assessment & Plan Note (Signed)
 Advised patient to check with his insurance company regarding eligibility for the GLP-1's for obesity.  Also advised him to check out manufacture website directly if it is not covered by insurance.  Patient has limited ability to exercise due to hip replacement.  Has hypertension, low testosterone.  No history of ASCVD.

## 2024-01-03 NOTE — Assessment & Plan Note (Signed)
 Blood pressure has been on the low end of normal the last few times.  Takes the lowest dose of bisoprolol-HCTZ.  Advised him to check at home and if continues to be low to try a half a tablet.

## 2024-01-03 NOTE — Assessment & Plan Note (Signed)
 Complaining of weakened stream.  Would like his urine checked for infection.  Urine dipstick was normal.

## 2024-01-03 NOTE — Patient Instructions (Addendum)
 It was nice to see you today,  We addressed the following topics today: -In order to see if you can be prescribed the weight loss medications I would like you to call your insurance company, the 1 that you use for prescriptions, and asked them if there are any indications in which either Zepbound or Wegovy would be covered.  If they do cover it we can send in a prescription and do the necessary paperwork.  if they do not cover it then your next best option would be to reach out to the [employer] website and see if they can provide you a discounted rate for the medication  -zepbound: https://lillydirect.lilly.com/ - wegovy: https://www.novoc[employer].com/obesity/products/wegovy/get-product.html  Have a great day,  Frederic Jericho, MD

## 2024-01-04 ENCOUNTER — Telehealth: Payer: Self-pay

## 2024-01-04 LAB — CBC WITH DIFFERENTIAL/PLATELET
Basophils Absolute: 0.1 10*3/uL (ref 0.0–0.2)
Basos: 2 %
EOS (ABSOLUTE): 0.4 10*3/uL (ref 0.0–0.4)
Eos: 7 %
Hematocrit: 55 % — ABNORMAL HIGH (ref 37.5–51.0)
Hemoglobin: 18 g/dL — ABNORMAL HIGH (ref 13.0–17.7)
Immature Grans (Abs): 0 10*3/uL (ref 0.0–0.1)
Immature Granulocytes: 1 %
Lymphocytes Absolute: 1.6 10*3/uL (ref 0.7–3.1)
Lymphs: 24 %
MCH: 29.5 pg (ref 26.6–33.0)
MCHC: 32.7 g/dL (ref 31.5–35.7)
MCV: 90 fL (ref 79–97)
Monocytes Absolute: 0.9 10*3/uL (ref 0.1–0.9)
Monocytes: 15 %
Neutrophils Absolute: 3.4 10*3/uL (ref 1.4–7.0)
Neutrophils: 51 %
Platelets: 171 10*3/uL (ref 150–450)
RBC: 6.11 x10E6/uL — ABNORMAL HIGH (ref 4.14–5.80)
RDW: 14.5 % (ref 11.6–15.4)
WBC: 6.4 10*3/uL (ref 3.4–10.8)

## 2024-01-04 LAB — COMPREHENSIVE METABOLIC PANEL
ALT: 38 IU/L (ref 0–44)
AST: 37 IU/L (ref 0–40)
Albumin: 4.2 g/dL (ref 3.9–4.9)
Alkaline Phosphatase: 102 IU/L (ref 44–121)
BUN/Creatinine Ratio: 17 (ref 10–24)
BUN: 17 mg/dL (ref 8–27)
Bilirubin Total: 0.9 mg/dL (ref 0.0–1.2)
CO2: 24 mmol/L (ref 20–29)
Calcium: 9.7 mg/dL (ref 8.6–10.2)
Chloride: 99 mmol/L (ref 96–106)
Creatinine, Ser: 0.98 mg/dL (ref 0.76–1.27)
Globulin, Total: 2.8 g/dL (ref 1.5–4.5)
Glucose: 103 mg/dL — ABNORMAL HIGH (ref 70–99)
Potassium: 4.4 mmol/L (ref 3.5–5.2)
Sodium: 137 mmol/L (ref 134–144)
Total Protein: 7 g/dL (ref 6.0–8.5)
eGFR: 85 mL/min/{1.73_m2} (ref >59)

## 2024-01-04 LAB — LIPID PANEL
Chol/HDL Ratio: 4.4 ratio (ref 0.0–5.0)
Cholesterol, Total: 162 mg/dL (ref 100–199)
HDL: 37 mg/dL — ABNORMAL LOW (ref >39)
LDL Chol Calc (NIH): 105 mg/dL — ABNORMAL HIGH (ref 0–99)
Triglycerides: 108 mg/dL (ref 0–149)
VLDL Cholesterol Cal: 20 mg/dL (ref 5–40)

## 2024-01-04 LAB — ERYTHROPOIETIN: Erythropoietin: 6.1 m[IU]/mL (ref 2.6–18.5)

## 2024-01-04 LAB — ESTROGENS, TOTAL: Estrogen: 101 pg/mL (ref 56–213)

## 2024-01-04 LAB — TESTOSTERONE: Testosterone: 181 ng/dL — ABNORMAL LOW (ref 264–916)

## 2024-01-04 LAB — PSA: Prostate Specific Ag, Serum: 0.4 ng/mL (ref 0.0–4.0)

## 2024-01-04 NOTE — Telephone Encounter (Signed)
 Copied from CRM [id number]. Topic: Clinical - Prescription Issue >> Jan 04, 2024  2:11 PM [name] wrote: Reason for CRM: Pt called in because he was speaking to his insurance [employer] about the medication Zepbound. Pt already discuss paying options for the medication. [employer] faxed over some papers for pt. Pt would like a status update [phone]   Papers are placed in PCP basket

## 2024-01-08 NOTE — Telephone Encounter (Signed)
 Patient calling for update on forms. Patient requesting forms be filled out as soon as possible.

## 2024-01-09 NOTE — Telephone Encounter (Signed)
 Called pt he is advised of the recommendation and also he stated that if he was denied of the Zepbound that he would pay the 240 out of pocket

## 2024-01-09 NOTE — Telephone Encounter (Signed)
 Please let the patient know that the form has been filled out and will be faxed today.  Also let him know that several of the questions asked in the form were in regards to sleep apnea.  I have talked about sleep apnea with him in the past and getting a sleep study.  If the Zepbound is denied, I think the best thing to do would be to get a sleep study to prove he has sleep apnea (which I think he does) and then resubmit the request for Zepbound.

## 2024-02-07 ENCOUNTER — Other Ambulatory Visit: Payer: Self-pay | Admitting: Family Medicine

## 2024-02-07 DIAGNOSIS — F5101 Primary insomnia: Secondary | ICD-10-CM

## 2024-02-29 ENCOUNTER — Telehealth: Payer: Self-pay

## 2024-02-29 ENCOUNTER — Other Ambulatory Visit: Payer: Self-pay | Admitting: Family Medicine

## 2024-02-29 DIAGNOSIS — D751 Secondary polycythemia: Secondary | ICD-10-CM

## 2024-02-29 NOTE — Telephone Encounter (Signed)
-----   Message from Laneta Pintos sent at 02/29/2024  7:52 AM EDT ----- Please call the patient and let him know that his liver and kidney function test were normal, his testosterone  level was low, and his hemoglobin level was elevated.  Because his EPO level was also in the low normal range, I would like him to come in for an additional lab test for evaluation of something called polycythemia vera to explain his elevated hemoglobin levels.  At that time we can also check his testosterone  level again.  Have him schedule the appointment roughly halfway between his injections of testosterone .

## 2024-02-29 NOTE — Telephone Encounter (Signed)
 LVM to return call for results

## 2024-03-03 ENCOUNTER — Emergency Department (HOSPITAL_COMMUNITY)

## 2024-03-03 ENCOUNTER — Observation Stay (HOSPITAL_COMMUNITY)
Admission: EM | Admit: 2024-03-03 | Discharge: 2024-03-04 | Disposition: A | Discharge status: Home / Self Care | Attending: Gastroenterology | Admitting: Gastroenterology

## 2024-03-03 ENCOUNTER — Encounter (HOSPITAL_COMMUNITY): Payer: Self-pay | Admitting: Internal Medicine

## 2024-03-03 ENCOUNTER — Other Ambulatory Visit: Payer: Self-pay

## 2024-03-03 DIAGNOSIS — E291 Testicular hypofunction: Secondary | ICD-10-CM | POA: Insufficient documentation

## 2024-03-03 DIAGNOSIS — K625 Hemorrhage of anus and rectum: Secondary | ICD-10-CM | POA: Diagnosis not present

## 2024-03-03 DIAGNOSIS — L309 Dermatitis, unspecified: Secondary | ICD-10-CM | POA: Diagnosis not present

## 2024-03-03 DIAGNOSIS — R1084 Generalized abdominal pain: Secondary | ICD-10-CM | POA: Diagnosis present

## 2024-03-03 DIAGNOSIS — Z79899 Other long term (current) drug therapy: Secondary | ICD-10-CM | POA: Diagnosis not present

## 2024-03-03 DIAGNOSIS — F5101 Primary insomnia: Secondary | ICD-10-CM | POA: Diagnosis not present

## 2024-03-03 DIAGNOSIS — N400 Enlarged prostate without lower urinary tract symptoms: Secondary | ICD-10-CM | POA: Diagnosis not present

## 2024-03-03 DIAGNOSIS — E669 Obesity, unspecified: Secondary | ICD-10-CM | POA: Diagnosis not present

## 2024-03-03 DIAGNOSIS — Z6836 Body mass index (BMI) 36.0-36.9, adult: Secondary | ICD-10-CM | POA: Diagnosis not present

## 2024-03-03 DIAGNOSIS — Z87891 Personal history of nicotine dependence: Secondary | ICD-10-CM | POA: Diagnosis not present

## 2024-03-03 DIAGNOSIS — Z6834 Body mass index (BMI) 34.0-34.9, adult: Secondary | ICD-10-CM | POA: Insufficient documentation

## 2024-03-03 DIAGNOSIS — K922 Gastrointestinal hemorrhage, unspecified: Principal | ICD-10-CM

## 2024-03-03 DIAGNOSIS — R7989 Other specified abnormal findings of blood chemistry: Secondary | ICD-10-CM | POA: Diagnosis present

## 2024-03-03 DIAGNOSIS — R011 Cardiac murmur, unspecified: Secondary | ICD-10-CM | POA: Diagnosis not present

## 2024-03-03 DIAGNOSIS — I10 Essential (primary) hypertension: Secondary | ICD-10-CM | POA: Diagnosis not present

## 2024-03-03 DIAGNOSIS — N401 Enlarged prostate with lower urinary tract symptoms: Secondary | ICD-10-CM | POA: Insufficient documentation

## 2024-03-03 DIAGNOSIS — Z8709 Personal history of other diseases of the respiratory system: Secondary | ICD-10-CM | POA: Diagnosis not present

## 2024-03-03 LAB — I-STAT CG4 LACTIC ACID, ED
Lactic Acid, Venous: 0.8 mmol/L (ref 0.5–1.9)
Lactic Acid, Venous: 1 mmol/L (ref 0.5–1.9)
Lactic Acid, Venous: 0.9 mmol/L (ref 0.5–1.9)

## 2024-03-03 LAB — CBC WITH DIFFERENTIAL/PLATELET
Abs Immature Granulocytes: 0.07 10*3/uL (ref 0.00–0.07)
Basophils Absolute: 0.1 10*3/uL (ref 0.0–0.1)
Basophils Relative: 1 %
Eosinophils Absolute: 0.4 10*3/uL (ref 0.0–0.5)
Eosinophils Relative: 3 %
HCT: 45.1 % (ref 39.0–52.0)
Hemoglobin: 14.3 g/dL (ref 13.0–17.0)
Immature Granulocytes: 1 %
Lymphocytes Relative: 12 %
Lymphs Abs: 1.3 10*3/uL (ref 0.7–4.0)
MCH: 29.6 pg (ref 26.0–34.0)
MCHC: 31.7 g/dL (ref 30.0–36.0)
MCV: 93.4 fL (ref 80.0–100.0)
Monocytes Absolute: 1.3 10*3/uL — ABNORMAL HIGH (ref 0.1–1.0)
Monocytes Relative: 12 %
Neutro Abs: 7.7 10*3/uL (ref 1.7–7.7)
Neutrophils Relative %: 71 %
Platelets: 165 10*3/uL (ref 150–400)
RBC: 4.83 MIL/uL (ref 4.22–5.81)
RDW: 14.9 % (ref 11.5–15.5)
WBC: 10.8 10*3/uL — ABNORMAL HIGH (ref 4.0–10.5)
nRBC: 0 % (ref 0.0–0.2)

## 2024-03-03 LAB — URINALYSIS, W/ REFLEX TO CULTURE (INFECTION SUSPECTED)
Bacteria, UA: NONE SEEN
Bilirubin Urine: NEGATIVE
Glucose, UA: NEGATIVE mg/dL
Hgb urine dipstick: NEGATIVE
Ketones, ur: NEGATIVE mg/dL
Leukocytes,Ua: NEGATIVE
Nitrite: NEGATIVE
Protein, ur: NEGATIVE mg/dL
Specific Gravity, Urine: 1.04 — ABNORMAL HIGH (ref 1.005–1.030)
pH: 6 (ref 5.0–8.0)

## 2024-03-03 LAB — COMPREHENSIVE METABOLIC PANEL WITH GFR
ALT: 25 U/L (ref 0–44)
AST: 25 U/L (ref 15–41)
Albumin: 3.3 g/dL — ABNORMAL LOW (ref 3.5–5.0)
Alkaline Phosphatase: 53 U/L (ref 38–126)
Anion gap: 9 (ref 5–15)
BUN: 14 mg/dL (ref 8–23)
CO2: 22 mmol/L (ref 22–32)
Calcium: 8.3 mg/dL — ABNORMAL LOW (ref 8.9–10.3)
Chloride: 104 mmol/L (ref 98–111)
Creatinine, Ser: 0.94 mg/dL (ref 0.61–1.24)
GFR, Estimated: >60 mL/min (ref >60)
Glucose, Bld: 125 mg/dL — ABNORMAL HIGH (ref 70–99)
Potassium: 4 mmol/L (ref 3.5–5.1)
Sodium: 135 mmol/L (ref 135–145)
Total Bilirubin: 0.8 mg/dL (ref 0.0–1.2)
Total Protein: 5.9 g/dL — ABNORMAL LOW (ref 6.5–8.1)

## 2024-03-03 LAB — HEMOGLOBIN AND HEMATOCRIT, BLOOD
HCT: 44.4 % (ref 39.0–52.0)
HCT: 41.9 % (ref 39.0–52.0)
Hemoglobin: 14.2 g/dL (ref 13.0–17.0)
Hemoglobin: 13.2 g/dL (ref 13.0–17.0)

## 2024-03-03 LAB — PROTIME-INR
INR: 1.1 (ref 0.8–1.2)
Prothrombin Time: 14.7 s (ref 11.4–15.2)

## 2024-03-03 LAB — POC OCCULT BLOOD, ED
Fecal Occult Bld: POSITIVE — AB
Fecal Occult Bld: POSITIVE — AB
Fecal Occult Bld: POSITIVE — AB
Fecal Occult Bld: POSITIVE — AB

## 2024-03-03 LAB — BLOOD GAS, VENOUS
Acid-Base Excess: 2.4 mmol/L — ABNORMAL HIGH (ref 0.0–2.0)
Bicarbonate: 28.4 mmol/L — ABNORMAL HIGH (ref 20.0–28.0)
O2 Saturation: 66.3 %
Patient temperature: 37
pCO2, Ven: 48 mmHg (ref 44–60)
pH, Ven: 7.38 (ref 7.25–7.43)
pO2, Ven: 39 mmHg (ref 32–45)

## 2024-03-03 LAB — HIV ANTIBODY (ROUTINE TESTING W REFLEX): HIV Screen 4th Generation wRfx: NONREACTIVE

## 2024-03-03 LAB — LIPASE, BLOOD: Lipase: 35 U/L (ref 11–51)

## 2024-03-03 LAB — TYPE AND SCREEN
ABO/RH(D): A POS
Antibody Screen: NEGATIVE

## 2024-03-03 LAB — TROPONIN I (HIGH SENSITIVITY): Troponin I (High Sensitivity): 15 ng/L (ref <18)

## 2024-03-03 LAB — ETHANOL: Alcohol, Ethyl (B): <15 mg/dL (ref <15)

## 2024-03-03 MED ORDER — ONDANSETRON HCL 4 MG/2ML IJ SOLN: 4.0000 mg | Freq: Four times a day (QID) | INTRAMUSCULAR | Status: DC | PRN

## 2024-03-03 MED ORDER — ALBUTEROL SULFATE (2.5 MG/3ML) 0.083% IN NEBU: 2.5000 mg | INHALATION_SOLUTION | RESPIRATORY_TRACT | Status: DC | PRN

## 2024-03-03 MED ORDER — OXYCODONE HCL 5 MG PO TABS: 5.0000 mg | ORAL_TABLET | ORAL | Status: DC | PRN

## 2024-03-03 MED ORDER — ACETAMINOPHEN 650 MG RE SUPP: 650.0000 mg | Freq: Four times a day (QID) | RECTAL | Status: DC | PRN

## 2024-03-03 MED ORDER — PANTOPRAZOLE SODIUM 40 MG IV SOLR
80.0000 mg | Freq: Once | INTRAVENOUS | Status: AC
  Administered 2024-03-03: 80 mg via INTRAVENOUS
  Filled 2024-03-03: qty 20

## 2024-03-03 MED ORDER — IOHEXOL 350 MG/ML SOLN
100.0000 mL | Freq: Once | INTRAVENOUS | Status: AC | PRN
  Administered 2024-03-03: 100 mL via INTRAVENOUS

## 2024-03-03 MED ORDER — ONDANSETRON HCL 4 MG PO TABS: 4.0000 mg | ORAL_TABLET | Freq: Four times a day (QID) | ORAL | Status: DC | PRN

## 2024-03-03 MED ORDER — LACTATED RINGERS IV BOLUS
1000.0000 mL | Freq: Once | INTRAVENOUS | Status: AC
  Administered 2024-03-03: 1000 mL via INTRAVENOUS

## 2024-03-03 MED ORDER — HYDROCORTISONE ACETATE 25 MG RE SUPP
25.0000 mg | Freq: Two times a day (BID) | RECTAL | Status: DC
  Filled 2024-03-03 (×4): qty 1

## 2024-03-03 MED ORDER — ACETAMINOPHEN 325 MG PO TABS: 650.0000 mg | ORAL_TABLET | Freq: Four times a day (QID) | ORAL | Status: DC | PRN

## 2024-03-03 NOTE — ED Triage Notes (Signed)
 BIB EMS from home for abdominal pain. Woke up at 630 am and had 6 bloody bowel movements, Cool clammy skin   Initial bp 66/33 given 6L NS 96% RA HR 91 3L nasal canula for comgort

## 2024-03-03 NOTE — ED Provider Notes (Signed)
 [place] EMERGENCY DEPARTMENT AT [facility] Provider Note   CSN: [id number] Arrival date & time: 03/03/24  0831     History  Chief Complaint  Patient presents with   Abdominal Pain    Ronald Parsons is a 68 y.o. male.  68 year old male with prior medical history as detailed below presents for evaluation.  Patient arrives from home with EMS transport.  Patient reports that this morning he woke up with crampy lower abdominal pain.  Shortly after waking, the patient went to the bathroom for BM.  He reports that he had significant bloody bowel movements.  Patient reports that he nearly passed out while on the toilet.  EMS reports that initial blood pressure was 66/33.  EMS reports giving 6 L of normal saline during transport.  Patient feels improved on arrival.  Blood pressure is 90/60.  Patient denies prior history of significant GI bleed.  Patient denies use of anticoagulation.  Patient reports prior colonoscopy at least 15 years ago.  The history is provided by the patient.       Home Medications Prior to Admission medications   Medication Sig Start Date End Date Taking? Authorizing Provider  albuterol  (VENTOLIN  HFA) 108 (90 Base) MCG/ACT inhaler Inhale 2 puffs into the lungs every 6 (six) hours as needed for wheezing. 11/28/23   Noreene Bearded, PA  bisoprolol -hydrochlorothiazide  (ZIAC ) 2.5-6.25 MG tablet TAKE 1/2 TO 1 TABLET BY MOUTH ONCE DAILY 10/12/23   Laneta Pintos, MD  celecoxib  (CELEBREX ) 200 MG capsule TAKE 1 CAPSULE BY MOUTH 2 TIMES DAILY AS NEEDED FOR MILD PAIN OR MODERATE PAIN. 12/05/23   Laneta Pintos, MD  clobetasol  cream (TEMOVATE ) 0.05 % USE AS DIRECTED 3 TIMES A DAY AS NEEDED FOR RASH. 09/25/23   Laneta Pintos, MD  clobetasol  cream (TEMOVATE ) 0.05 % Apply 1 Application topically 2 (two) times daily. 04/06/23   Sharyon Deis, NP  levofloxacin  (LEVAQUIN ) 750 MG tablet Take 1 tablet (750 mg total) by mouth daily. 11/28/23   Maryclare Smoke A, PA   LORATADINE -D 12HR 5-120 MG tablet TAKE 1 TABLET BY MOUTH 2 TIMES DAILY. 12/04/23   Laneta Pintos, MD  nitroGLYCERIN  (NITROSTAT ) 0.4 MG SL tablet Place 1 tablet (0.4 mg total) under the tongue every 5 (five) minutes as needed for chest pain. If you require more than two tablets five minutes apart go to the nearest ER via EMS. 05/12/20 11/07/22  Olinda Bertrand, DO  NON FORMULARY balance of nature    [provider]  oxyCODONE  (ROXICODONE ) 15 MG immediate release tablet TAKE 1/2 TABLET BY MOUTH ONCE DAILY AS NEEDED 12/26/23   Olson, Daniel K, MD  phentermine (ADIPEX-P) 37.5 MG tablet TAKE 1 TABLET BY MOUTH DAILY 10/12/23   Laneta Pintos, MD  SYRINGE-NEEDLE, DISP, 3 ML (B-D 3CC LUER-LOK SYR 22GX1-1/2) 22G X 1-1/2" 3 ML MISC To use with testosterone  injections every 3 weeks 01/13/23   Boscia, Heather E, NP  tamsulosin  (FLOMAX ) 0.4 MG CAPS capsule Take 1 capsule (0.4 mg total) by mouth in the morning and at bedtime. 12/26/23   Laneta Pintos, MD  testosterone  cypionate (DEPOTESTOSTERONE CYPIONATE) 200 MG/ML injection INJECT 0.5 ML INTO THE MUSCLE EVERY 21 DAYS. 11/20/23   Laneta Pintos, MD  triamcinolone  (NASACORT ) 55 MCG/ACT AERO nasal inhaler Place 2 sprays into the nose daily. 01/13/23   Sharyon Deis, NP  zolpidem  (AMBIEN ) 10 MG tablet TAKE 1 TABLET BY MOUTH AT BEDTIME. 02/07/24   Ambrosio Junker  K, MD      Allergies    Hydromorphone  hcl, Penicillins, and Lovastatin    Review of Systems   Review of Systems  All other systems reviewed and are negative.   Physical Exam Updated Vital Signs BP 97/60   Pulse 79   Temp 97.6 F (36.4 C) (Oral)   Resp 16   Ht 5\' 9"  (1.753 m)   Wt 113.4 kg   SpO2 99%   BMI 36.92 kg/m  Physical Exam Vitals and nursing note reviewed.  Constitutional:      General: He is not in acute distress.    Appearance: Normal appearance. He is well-developed.  HENT:     Head: Normocephalic and atraumatic.  Eyes:     Conjunctiva/sclera: Conjunctivae normal.      Pupils: Pupils are equal, round, and reactive to light.  Cardiovascular:     Rate and Rhythm: Normal rate and regular rhythm.     Heart sounds: Normal heart sounds.  Pulmonary:     Effort: Pulmonary effort is normal. No respiratory distress.     Breath sounds: Normal breath sounds.  Abdominal:     General: There is no distension.     Palpations: Abdomen is soft.     Tenderness: There is abdominal tenderness in the suprapubic area.  Genitourinary:    Comments: DRE performed, bright red blood per rectum noted. Musculoskeletal:        General: No deformity. Normal range of motion.     Cervical back: Normal range of motion and neck supple.  Skin:    General: Skin is warm and dry.  Neurological:     General: No focal deficit present.     Mental Status: He is alert and oriented to person, place, and time.     ED Results / Procedures / Treatments   Labs (all labs ordered are listed, but only abnormal results are displayed) Labs Reviewed  CBC WITH DIFFERENTIAL/PLATELET - Abnormal; Notable for the following components:      Result Value   WBC 10.8 (*)    Monocytes Absolute 1.3 (*)    All other components within normal limits  COMPREHENSIVE METABOLIC PANEL WITH GFR - Abnormal; Notable for the following components:   Glucose, Bld 125 (*)    Calcium 8.3 (*)    Total Protein 5.9 (*)    Albumin 3.3 (*)    All other components within normal limits  BLOOD GAS, VENOUS - Abnormal; Notable for the following components:   Bicarbonate 28.4 (*)    Acid-Base Excess 2.4 (*)    All other components within normal limits  POC OCCULT BLOOD, ED - Abnormal; Notable for the following components:   Fecal Occult Bld POSITIVE (*)    All other components within normal limits  POC OCCULT BLOOD, ED - Abnormal; Notable for the following components:   Fecal Occult Bld POSITIVE (*)    All other components within normal limits  POC OCCULT BLOOD, ED - Abnormal; Notable for the following components:   Fecal  Occult Bld POSITIVE (*)    All other components within normal limits  POC OCCULT BLOOD, ED - Abnormal; Notable for the following components:   Fecal Occult Bld POSITIVE (*)    All other components within normal limits  LIPASE, BLOOD  ETHANOL  PROTIME-INR  URINALYSIS, W/ REFLEX TO CULTURE (INFECTION SUSPECTED)  I-STAT CHEM 8, ED  I-STAT CG4 LACTIC ACID, ED  I-STAT CG4 LACTIC ACID, ED  CBG MONITORING, ED  I-STAT CG4 LACTIC  ACID, ED  TYPE AND SCREEN  TROPONIN I (HIGH SENSITIVITY)  TROPONIN I (HIGH SENSITIVITY)    EKG None  Radiology DG Chest Port 1 View Result Date: 03/03/2024 CLINICAL DATA:  sob EXAM: PORTABLE CHEST - 1 VIEW COMPARISON:  None Available. FINDINGS: Cardiac silhouette is unremarkable. No pneumothorax or pleural effusion. The lungs are clear. There are thoracic degenerative changes. IMPRESSION: No acute cardiopulmonary process. Electronically Signed   By: Sydell Eva M.D.   On: 03/03/2024 10:42   CT Angio Abd/Pel W and/or Wo Contrast Result Date: 03/03/2024 CLINICAL DATA:  Lower GI bleeding EXAM: CTA ABDOMEN AND PELVIS WITHOUT AND WITH CONTRAST TECHNIQUE: Multidetector CT imaging of the abdomen and pelvis was performed using the standard protocol during bolus administration of intravenous contrast. Multiplanar reconstructed images and MIPs were obtained and reviewed to evaluate the vascular anatomy. RADIATION DOSE REDUCTION: This exam was performed according to the departmental dose-optimization program which includes automated exposure control, adjustment of the mA and/or kV according to patient size and/or use of iterative reconstruction technique. CONTRAST:  OMNIPAQUE  IOHEXOL  350 MG/ML SOLN COMPARISON:  None Available. FINDINGS: VASCULAR No evidence of active arterial extravasation into the GI tract. Aorta: Patent, no aneurysm. Celiac: Patent. SMA: Patent. Renals: Patent. IMA: Patent. Inflow: Patent. Proximal Outflow: Patent. Veins: No obvious venous abnormality  within the limitations of this arterial phase study. Review of the MIP images confirms the above findings. NON-VASCULAR Lower chest: No acute abnormality. Hepatobiliary: No focal liver abnormality is seen. No gallstones, gallbladder wall thickening, or biliary dilatation. Pancreas: Unremarkable. No pancreatic ductal dilatation or surrounding inflammatory changes. Spleen: Normal in size without focal abnormality. Adrenals/Urinary Tract: Adrenal glands are unremarkable. Kidneys are normal, without renal calculi, focal lesion, or hydronephrosis. Bladder is unremarkable. Stomach/Bowel: No dilated loops of bowel are seen. There is no evidence of active arterial extravasation to the GI tract. The appendix is well seen and within normal limits. Colonic diverticulosis is present, particularly in the left colon. Lymphatic: No significant vascular findings are present. No enlarged abdominal or pelvic lymph nodes. Reproductive: Nothing significant. Other: Nothing significant. Musculoskeletal: Right hip prosthesis. Postsurgical changes from posterior spinal fusion. IMPRESSION: 1. No evidence of active arterial extravasation into the GI tract. 2. Colonic diverticulosis. Electronically Signed   By: Reagan Camera M.D.   On: 03/03/2024 10:39    Procedures Procedures    Medications Ordered in ED Medications  lactated ringers  bolus 1,000 mL (1,000 mLs Intravenous New Bag/Given 03/03/24 1006)  pantoprazole (PROTONIX) injection 80 mg (80 mg Intravenous Given 03/03/24 0958)  iohexol  (OMNIPAQUE ) 350 MG/ML injection 100 mL (100 mLs Intravenous Contrast Given 03/03/24 1012)    ED Course/ Medical Decision Making/ A&P                                 Medical Decision Making Amount and/or Complexity of Data Reviewed Labs: ordered. Radiology: ordered.  Risk Prescription drug management.    Medical Screen Complete  This patient presented to the ED with complaint of rectal bleeding, abdominal pain.  This complaint  involves an extensive number of treatment options. The initial differential diagnosis includes, but is not limited to, diverticular bleeding, other GI bleeding, anemia, metabolic abnormality, etc.  This presentation is: Acute, Self-Limited, Previously Undiagnosed, Uncertain Prognosis, Complicated, Systemic Symptoms, and Threat to Life/Bodily Function  Patient reports acute onset bright red blood per rectum.  Patient was hypotensive in the field.  In the ED blood pressure  is 90 over 60s.  Initial hemoglobin is 14.3.  CT angio abdomen pelvis does not reveal active extravasation.  Patient would benefit for admission for further workup.  Hospitalist service made aware of case.  [facility] made aware of case.  Additional history obtained:  External records from outside sources obtained and reviewed including prior ED visits and prior Inpatient records.   Problem List / ED Course:  GI bleeding  Disposition:  After consideration of the diagnostic results and the patients response to treatment, I feel that the patent would benefit from admission.          Final Clinical Impression(s) / ED Diagnoses Final diagnoses:  Gastrointestinal hemorrhage, unspecified gastrointestinal hemorrhage type    Rx / DC Orders ED Discharge Orders     None         Burnette Carte, MD 03/03/24 1128

## 2024-03-03 NOTE — Consult Note (Signed)
 Referring Provider: ED physician Primary Care Physician:  Laneta Pintos, MD Primary Gastroenterologist: Dr. Honey Lusty  Reason for Consultation: Rectal bleeding  HPI: Ronald Parsons is a 68 y.o. male with past medical history mentioned below presented to the hospital with abdominal pain and rectal bleeding.  Was found to have hypotension with blood pressure of 66/33 on arrival.  Initial blood work this morning showed normal hemoglobin of 14.3, normal troponin, normal lipase and normal CMP.  Normal INR. CT angio showed no evidence of active bleeding and diverticulosis.  Patient seen and examined at bedside.  According to him, he woke up around 630 this a.m. with sensation of bowel movement and noted large amount of blood in the commode.  Had 6 bloody bowel movements since then.  Had some abdominal cramps which has resolved now.  Denies nausea vomiting.  Denies similar symptoms in the past.  Takes Celebrex  and aspirin  daily.  Last colonoscopy around 15 years ago with Dr. Honey Lusty according to patient.  Past Medical History:  Diagnosis Date   Asthma    allergy related   Cervical radiculitis    Depression    DISH (diffuse idiopathic skeletal hyperostosis)    Gout    Hyperlipidemia    Hypertension    Lumbago    Osteoarthritis     Past Surgical History:  Procedure Laterality Date   BACK SURGERY  10/24/1989   laminectomy   CARDIAC CATHETERIZATION     5-7 years ago, states it was clear   COLONOSCOPY     HERNIA REPAIR     inguinal hernia repair as a baby on right groin   HIP SURGERY Right    01/2023   KNEE SURGERY Right    2023   RIGHT/LEFT HEART CATH AND CORONARY ANGIOGRAPHY N/A 06/09/2020   Procedure: RIGHT/LEFT HEART CATH AND CORONARY ANGIOGRAPHY;  Surgeon: Knox Perl, MD;  Location: [facility] INVASIVE CV LAB;  Service: Cardiovascular;  Laterality: N/A;   SHOULDER SURGERY Bilateral    2017    Prior to Admission medications   Medication Sig Start Date End Date Taking? Authorizing  Provider  albuterol  (VENTOLIN  HFA) 108 (90 Base) MCG/ACT inhaler Inhale 2 puffs into the lungs every 6 (six) hours as needed for wheezing. 11/28/23   Noreene Bearded, PA  bisoprolol -hydrochlorothiazide  (ZIAC ) 2.5-6.25 MG tablet TAKE 1/2 TO 1 TABLET BY MOUTH ONCE DAILY 10/12/23   Laneta Pintos, MD  celecoxib  (CELEBREX ) 200 MG capsule TAKE 1 CAPSULE BY MOUTH 2 TIMES DAILY AS NEEDED FOR MILD PAIN OR MODERATE PAIN. 12/05/23   Laneta Pintos, MD  clobetasol  cream (TEMOVATE ) 0.05 % USE AS DIRECTED 3 TIMES A DAY AS NEEDED FOR RASH. 09/25/23   Laneta Pintos, MD  clobetasol  cream (TEMOVATE ) 0.05 % Apply 1 Application topically 2 (two) times daily. 04/06/23   Sharyon Deis, NP  levofloxacin  (LEVAQUIN ) 750 MG tablet Take 1 tablet (750 mg total) by mouth daily. 11/28/23   Maryclare Smoke A, PA  LORATADINE -D 12HR 5-120 MG tablet TAKE 1 TABLET BY MOUTH 2 TIMES DAILY. 12/04/23   Laneta Pintos, MD  nitroGLYCERIN  (NITROSTAT ) 0.4 MG SL tablet Place 1 tablet (0.4 mg total) under the tongue every 5 (five) minutes as needed for chest pain. If you require more than two tablets five minutes apart go to the nearest ER via EMS. 05/12/20 11/07/22  Olinda Bertrand, DO  NON FORMULARY balance of nature    [provider]  oxyCODONE  (ROXICODONE ) 15 MG immediate release tablet TAKE 1/2  TABLET BY MOUTH ONCE DAILY AS NEEDED 12/26/23   Olson, Daniel K, MD  phentermine (ADIPEX-P) 37.5 MG tablet TAKE 1 TABLET BY MOUTH DAILY 10/12/23   Laneta Pintos, MD  SYRINGE-NEEDLE, DISP, 3 ML (B-D 3CC LUER-LOK SYR 22GX1-1/2) 22G X 1-1/2" 3 ML MISC To use with testosterone  injections every 3 weeks 01/13/23   Boscia, Heather E, NP  tamsulosin  (FLOMAX ) 0.4 MG CAPS capsule Take 1 capsule (0.4 mg total) by mouth in the morning and at bedtime. 12/26/23   Laneta Pintos, MD  testosterone  cypionate (DEPOTESTOSTERONE CYPIONATE) 200 MG/ML injection INJECT 0.5 ML INTO THE MUSCLE EVERY 21 DAYS. 11/20/23   Laneta Pintos, MD  triamcinolone  (NASACORT ) 55  MCG/ACT AERO nasal inhaler Place 2 sprays into the nose daily. 01/13/23   Sharyon Deis, NP  zolpidem  (AMBIEN ) 10 MG tablet TAKE 1 TABLET BY MOUTH AT BEDTIME. 02/07/24   Laneta Pintos, MD    Scheduled Meds: Continuous Infusions: PRN Meds:.  Allergies as of 03/03/2024 - Reviewed 03/03/2024  Allergen Reaction Noted   Hydromorphone  hcl Other (See Comments) 04/22/2020   Penicillins Hives 01/31/2013   Lovastatin Other (See Comments) 10/02/2018    Family History  Problem Relation Age of Onset   Heart disease Father    Hypertension Father    Breast cancer Sister    Valvular heart disease Maternal Grandfather    Emphysema Paternal Grandfather     Social History   Socioeconomic History   Marital status: Married    Spouse name: Not on file   Number of children: Not on file   Years of education: Not on file   Highest education level: Not on file  Occupational History   Not on file  Tobacco Use   Smoking status: Former    Passive exposure: Past   Smokeless tobacco: Current    Types: Chew  Vaping Use   Vaping status: Never Used  Substance and Sexual Activity   Alcohol use: Yes    Comment: once monthly   Drug use: No   Sexual activity: Not Currently  Other Topics Concern   Not on file  Social History Narrative   Lives at home with wife.  Retired Youth worker. Caffiene iced tea 1 glass daily.     Social Drivers of Corporate investment banker Strain: Low Risk  (10/26/2023)   Overall Financial Resource Strain (CARDIA)    Difficulty of Paying Living Expenses: Not hard at all  Food Insecurity: No Food Insecurity (10/26/2023)   Hunger Vital Sign    Worried About Running Out of Food in the Last Year: Never true    Ran Out of Food in the Last Year: Never true  Transportation Needs: No Transportation Needs (10/26/2023)   PRAPARE - Administrator, Civil Service (Medical): No    Lack of Transportation (Non-Medical): No  Physical Activity: Inactive (10/26/2023)    Exercise Vital Sign    Days of Exercise per Week: 0 days    Minutes of Exercise per Session: 0 min  Stress: No Stress Concern Present (10/26/2023)   [employer] of Occupational Health - Occupational Stress Questionnaire    Feeling of Stress : Only a little  Social Connections: Moderately Integrated (10/26/2023)   Social Connection and Isolation Panel [NHANES]    Frequency of Communication with Friends and Family: More than three times a week    Frequency of Social Gatherings with Friends and Family: Once a week    Attends Religious Services: 1  to 4 times per year    Active Member of Clubs or Organizations: No    Attends Banker Meetings: Never    Marital Status: Married  Catering manager Violence: Not At Risk (10/26/2023)   Humiliation, Afraid, Rape, and Kick questionnaire    Fear of Current or Ex-Partner: No    Emotionally Abused: No    Physically Abused: No    Sexually Abused: No    Review of Systems: All negative except as stated above in HPI.  Physical Exam: Vital signs: Vitals:   03/03/24 1000 03/03/24 1030  BP: 98/75 97/60  Pulse: 78 79  Resp:  16  Temp:    SpO2: 90% 99%     Physical Exam Constitutional:      General: He is not in acute distress.    Appearance: He is well-developed and normal weight.  HENT:     Head: Normocephalic and atraumatic.  Eyes:     Extraocular Movements: Extraocular movements intact.  Cardiovascular:     Rate and Rhythm: Normal rate.     Heart sounds: Normal heart sounds. No murmur heard. Pulmonary:     Effort: Pulmonary effort is normal. No respiratory distress.  Abdominal:     General: Bowel sounds are normal. There is no distension.     Palpations: Abdomen is soft.     Tenderness: There is no abdominal tenderness.  Skin:    General: Skin is warm.     Coloration: Skin is not jaundiced.  Neurological:     General: No focal deficit present.     Mental Status: He is alert.  Psychiatric:        Mood and Affect: Mood  normal. Mood is not anxious or depressed.        Behavior: Behavior normal.      GI:  Lab Results: Recent Labs    03/03/24 0932  WBC 10.8*  HGB 14.3  HCT 45.1  PLT 165   BMET Recent Labs    03/03/24 0932  NA 135  K 4.0  CL 104  CO2 22  GLUCOSE 125*  BUN 14  CREATININE 0.94  CALCIUM 8.3*   LFT Recent Labs    03/03/24 0932  PROT 5.9*  ALBUMIN 3.3*  AST 25  ALT 25  ALKPHOS 53  BILITOT 0.8   PT/INR Recent Labs    03/03/24 0932  LABPROT 14.7  INR 1.1     Studies/Results: DG Chest Port 1 View Result Date: 03/03/2024 CLINICAL DATA:  sob EXAM: PORTABLE CHEST - 1 VIEW COMPARISON:  None Available. FINDINGS: Cardiac silhouette is unremarkable. No pneumothorax or pleural effusion. The lungs are clear. There are thoracic degenerative changes. IMPRESSION: No acute cardiopulmonary process. Electronically Signed   By: Sydell Eva M.D.   On: 03/03/2024 10:42   CT Angio Abd/Pel W and/or Wo Contrast Result Date: 03/03/2024 CLINICAL DATA:  Lower GI bleeding EXAM: CTA ABDOMEN AND PELVIS WITHOUT AND WITH CONTRAST TECHNIQUE: Multidetector CT imaging of the abdomen and pelvis was performed using the standard protocol during bolus administration of intravenous contrast. Multiplanar reconstructed images and MIPs were obtained and reviewed to evaluate the vascular anatomy. RADIATION DOSE REDUCTION: This exam was performed according to the departmental dose-optimization program which includes automated exposure control, adjustment of the mA and/or kV according to patient size and/or use of iterative reconstruction technique. CONTRAST:  OMNIPAQUE  IOHEXOL  350 MG/ML SOLN COMPARISON:  None Available. FINDINGS: VASCULAR No evidence of active arterial extravasation into the GI tract. Aorta: Patent,  no aneurysm. Celiac: Patent. SMA: Patent. Renals: Patent. IMA: Patent. Inflow: Patent. Proximal Outflow: Patent. Veins: No obvious venous abnormality within the limitations of this arterial  phase study. Review of the MIP images confirms the above findings. NON-VASCULAR Lower chest: No acute abnormality. Hepatobiliary: No focal liver abnormality is seen. No gallstones, gallbladder wall thickening, or biliary dilatation. Pancreas: Unremarkable. No pancreatic ductal dilatation or surrounding inflammatory changes. Spleen: Normal in size without focal abnormality. Adrenals/Urinary Tract: Adrenal glands are unremarkable. Kidneys are normal, without renal calculi, focal lesion, or hydronephrosis. Bladder is unremarkable. Stomach/Bowel: No dilated loops of bowel are seen. There is no evidence of active arterial extravasation to the GI tract. The appendix is well seen and within normal limits. Colonic diverticulosis is present, particularly in the left colon. Lymphatic: No significant vascular findings are present. No enlarged abdominal or pelvic lymph nodes. Reproductive: Nothing significant. Other: Nothing significant. Musculoskeletal: Right hip prosthesis. Postsurgical changes from posterior spinal fusion. IMPRESSION: 1. No evidence of active arterial extravasation into the GI tract. 2. Colonic diverticulosis. Electronically Signed   By: Reagan Camera M.D.   On: 03/03/2024 10:39    Impression/Plan: -Acute rectal bleeding with abdominal cramps.  CT angio negative for any active bleeding showed diverticulosis.  Likely diverticular bleed versus hemorrhoidal bleed.  Hemoglobin normal at 14.2.  Rectal exam performed by emergency department physician showed bright red blood in the rectum.  Recommendations ------------------------- - Recommend supportive care for now.  Okay to start full liquid diet. - If continues to have bleeding or drop in hemoglobin, we may consider inpatient colonoscopy. - Start Anusol suppository - GI will follow    LOS: 0 days   [name]  MD, FACP 03/03/2024, 11:10 AM  Contact #  [phone]

## 2024-03-03 NOTE — H&P (Signed)
 History and Physical  Ronald Parsons NFA:[id number] DOB: [birth year] DOA: 03/03/2024  PCP: Laneta Pintos, MD   Chief Complaint: Bright red blood per rectum  HPI: Ronald Parsons is a 68 y.o. male with medical history significant for hypertension, hyperlipidemia being admitted to the hospital with bright red blood per rectum.  Patient was in his usual state of health until this morning at 6:30 AM when he had sudden onset of at least 6 bloody bowel movements consisting of bright red liquid blood.  He had some very mild associated lower abdominal pain, no nausea, has never had bleeding before and is not on any blood thinners.  He felt dizzy and lightheaded, with cool clammy skin.  EMS was called initial blood pressure was 66/33.  He was given IV fluids and brought to the emergency department.  Here, his blood pressure has been low but he has been hemodynamically stable.  He has passed gas here, but no further bowel movements.  Review of Systems: Please see HPI for pertinent positives and negatives. A complete 10 system review of systems are otherwise negative.  Past Medical History:  Diagnosis Date   Asthma    allergy related   Cervical radiculitis    Depression    DISH (diffuse idiopathic skeletal hyperostosis)    Gout    Hyperlipidemia    Hypertension    Lumbago    Osteoarthritis    Past Surgical History:  Procedure Laterality Date   BACK SURGERY  10/24/1989   laminectomy   CARDIAC CATHETERIZATION     5-7 years ago, states it was clear   COLONOSCOPY     HERNIA REPAIR     inguinal hernia repair as a baby on right groin   HIP SURGERY Right    01/2023   KNEE SURGERY Right    2023   RIGHT/LEFT HEART CATH AND CORONARY ANGIOGRAPHY N/A 06/09/2020   Procedure: RIGHT/LEFT HEART CATH AND CORONARY ANGIOGRAPHY;  Surgeon: Knox Perl, MD;  Location: [facility];  Service: Cardiovascular;  Laterality: N/A;   SHOULDER SURGERY Bilateral    2017   Social History:  reports that he  has quit smoking. He has been exposed to tobacco smoke. His smokeless tobacco use includes chew. He reports current alcohol use. He reports that he does not use drugs.  Allergies  Allergen Reactions   Hydromorphone  Hcl Other (See Comments)    Unruly   Penicillins Hives    As a child   Lovastatin Other (See Comments)    Myalgia    Family History  Problem Relation Age of Onset   Heart disease Father    Hypertension Father    Breast cancer Sister    Valvular heart disease Maternal Grandfather    Emphysema Paternal Grandfather      Prior to Admission medications   Medication Sig Start Date End Date Taking? Authorizing Provider  albuterol  (VENTOLIN  HFA) 108 (90 Base) MCG/ACT inhaler Inhale 2 puffs into the lungs every 6 (six) hours as needed for wheezing. 11/28/23   Noreene Bearded, PA  bisoprolol -hydrochlorothiazide  (ZIAC ) 2.5-6.25 MG tablet TAKE 1/2 TO 1 TABLET BY MOUTH ONCE DAILY 10/12/23   Laneta Pintos, MD  celecoxib  (CELEBREX ) 200 MG capsule TAKE 1 CAPSULE BY MOUTH 2 TIMES DAILY AS NEEDED FOR MILD PAIN OR MODERATE PAIN. 12/05/23   Laneta Pintos, MD  clobetasol  cream (TEMOVATE ) 0.05 % USE AS DIRECTED 3 TIMES A DAY AS NEEDED FOR RASH. 09/25/23   Laneta Pintos, MD  clobetasol  cream (TEMOVATE ) 0.05 % Apply 1 Application topically 2 (two) times daily. 04/06/23   Sharyon Deis, NP  levofloxacin  (LEVAQUIN ) 750 MG tablet Take 1 tablet (750 mg total) by mouth daily. 11/28/23   Maryclare Smoke A, PA  LORATADINE -D 12HR 5-120 MG tablet TAKE 1 TABLET BY MOUTH 2 TIMES DAILY. 12/04/23   Laneta Pintos, MD  nitroGLYCERIN  (NITROSTAT ) 0.4 MG SL tablet Place 1 tablet (0.4 mg total) under the tongue every 5 (five) minutes as needed for chest pain. If you require more than two tablets five minutes apart go to the nearest ER via EMS. 05/12/20 11/07/22  Olinda Bertrand, DO  NON FORMULARY balance of nature    [provider]  oxyCODONE  (ROXICODONE ) 15 MG immediate release tablet TAKE 1/2 TABLET BY  MOUTH ONCE DAILY AS NEEDED 12/26/23   Olson, Daniel K, MD  phentermine (ADIPEX-P) 37.5 MG tablet TAKE 1 TABLET BY MOUTH DAILY 10/12/23   Laneta Pintos, MD  SYRINGE-NEEDLE, DISP, 3 ML (B-D 3CC LUER-LOK SYR 22GX1-1/2) 22G X 1-1/2" 3 ML MISC To use with testosterone  injections every 3 weeks 01/13/23   Boscia, Heather E, NP  tamsulosin  (FLOMAX ) 0.4 MG CAPS capsule Take 1 capsule (0.4 mg total) by mouth in the morning and at bedtime. 12/26/23   Laneta Pintos, MD  testosterone  cypionate (DEPOTESTOSTERONE CYPIONATE) 200 MG/ML injection INJECT 0.5 ML INTO THE MUSCLE EVERY 21 DAYS. 11/20/23   Laneta Pintos, MD  triamcinolone  (NASACORT ) 55 MCG/ACT AERO nasal inhaler Place 2 sprays into the nose daily. 01/13/23   Sharyon Deis, NP  zolpidem  (AMBIEN ) 10 MG tablet TAKE 1 TABLET BY MOUTH AT BEDTIME. 02/07/24   Laneta Pintos, MD    Physical Exam: BP 97/60   Pulse 79   Temp 97.6 F (36.4 C) (Oral)   Resp 16   Ht 5\' 9"  (1.753 m)   Wt 113.4 kg   SpO2 99%   BMI 36.92 kg/m  General:  Alert, oriented, calm, in no acute distress, looks comfortable Cardiovascular: RRR, no murmurs or rubs, no peripheral edema  Respiratory: clear to auscultation bilaterally, no wheezes, no crackles  Abdomen: soft, tender in the bilateral lower quadrants, nondistended, normal bowel tones heard  Skin: dry, no rashes  Musculoskeletal: no joint effusions, normal range of motion  Psychiatric: appropriate affect, normal speech  Neurologic: extraocular muscles intact, clear speech, moving all extremities with intact sensorium         Labs on Admission:  Basic Metabolic Panel: Recent Labs  Lab 03/03/24 0932  NA 135  K 4.0  CL 104  CO2 22  GLUCOSE 125*  BUN 14  CREATININE 0.94  CALCIUM 8.3*   Liver Function Tests: Recent Labs  Lab 03/03/24 0932  AST 25  ALT 25  ALKPHOS 53  BILITOT 0.8  PROT 5.9*  ALBUMIN 3.3*   Recent Labs  Lab 03/03/24 0932  LIPASE 35   No results for input(s): "AMMONIA" in the last  168 hours. CBC: Recent Labs  Lab 03/03/24 0932  WBC 10.8*  NEUTROABS 7.7  HGB 14.3  HCT 45.1  MCV 93.4  PLT 165   Cardiac Enzymes: No results for input(s): "CKTOTAL", "CKMB", "CKMBINDEX", "TROPONINI" in the last 168 hours. BNP (last 3 results) No results for input(s): "BNP" in the last 8760 hours.  ProBNP (last 3 results) No results for input(s): "PROBNP" in the last 8760 hours.  CBG: No results for input(s): "GLUCAP" in the last 168 hours.  Radiological Exams on Admission:  DG Chest Port 1 View Result Date: 03/03/2024 CLINICAL DATA:  sob EXAM: PORTABLE CHEST - 1 VIEW COMPARISON:  None Available. FINDINGS: Cardiac silhouette is unremarkable. No pneumothorax or pleural effusion. The lungs are clear. There are thoracic degenerative changes. IMPRESSION: No acute cardiopulmonary process. Electronically Signed   By: Sydell Eva M.D.   On: 03/03/2024 10:42   CT Angio Abd/Pel W and/or Wo Contrast Result Date: 03/03/2024 CLINICAL DATA:  Lower GI bleeding EXAM: CTA ABDOMEN AND PELVIS WITHOUT AND WITH CONTRAST TECHNIQUE: Multidetector CT imaging of the abdomen and pelvis was performed using the standard protocol during bolus administration of intravenous contrast. Multiplanar reconstructed images and MIPs were obtained and reviewed to evaluate the vascular anatomy. RADIATION DOSE REDUCTION: This exam was performed according to the departmental dose-optimization program which includes automated exposure control, adjustment of the mA and/or kV according to patient size and/or use of iterative reconstruction technique. CONTRAST:  OMNIPAQUE  IOHEXOL  350 MG/ML SOLN COMPARISON:  None Available. FINDINGS: VASCULAR No evidence of active arterial extravasation into the GI tract. Aorta: Patent, no aneurysm. Celiac: Patent. SMA: Patent. Renals: Patent. IMA: Patent. Inflow: Patent. Proximal Outflow: Patent. Veins: No obvious venous abnormality within the limitations of this arterial phase study.  Review of the MIP images confirms the above findings. NON-VASCULAR Lower chest: No acute abnormality. Hepatobiliary: No focal liver abnormality is seen. No gallstones, gallbladder wall thickening, or biliary dilatation. Pancreas: Unremarkable. No pancreatic ductal dilatation or surrounding inflammatory changes. Spleen: Normal in size without focal abnormality. Adrenals/Urinary Tract: Adrenal glands are unremarkable. Kidneys are normal, without renal calculi, focal lesion, or hydronephrosis. Bladder is unremarkable. Stomach/Bowel: No dilated loops of bowel are seen. There is no evidence of active arterial extravasation to the GI tract. The appendix is well seen and within normal limits. Colonic diverticulosis is present, particularly in the left colon. Lymphatic: No significant vascular findings are present. No enlarged abdominal or pelvic lymph nodes. Reproductive: Nothing significant. Other: Nothing significant. Musculoskeletal: Right hip prosthesis. Postsurgical changes from posterior spinal fusion. IMPRESSION: 1. No evidence of active arterial extravasation into the GI tract. 2. Colonic diverticulosis. Electronically Signed   By: Reagan Camera M.D.   On: 03/03/2024 10:39   Assessment/Plan Ronald Parsons is a 68 y.o. male with medical history significant for hypertension, hyperlipidemia being admitted to the hospital with bright red blood per rectum.   BRBPR-with resultant hypotension.  Not anemic but probably too soon in the setting of acute bleeding.  He is not on any blood thinners.  Patient states he had colonoscopy over a decade ago, with diverticula seen.  This is a lower GI bleed possibly diverticular bleed, versus AVM versus other. -Observation admission -Clear liquid diet -Trend hemoglobin -Has already been seen by [facility] Dr. Veronda Goody  Hypertension-holding antihypertensives due to hypotension from above  DVT prophylaxis: SCDs only    Code Status: Full Code  Consults called: [facility]  Admission status: Observation  Time spent: 49 minutes  Omayra Tulloch Rickey Charm MD [facility] Pager [phone]  If 7PM-7AM, please contact night-coverage [email or web address] Password [facility]  03/03/2024, 11:24 AM

## 2024-03-04 DIAGNOSIS — L309 Dermatitis, unspecified: Secondary | ICD-10-CM | POA: Insufficient documentation

## 2024-03-04 DIAGNOSIS — K625 Hemorrhage of anus and rectum: Secondary | ICD-10-CM | POA: Diagnosis not present

## 2024-03-04 LAB — CBC
HCT: 42.4 % (ref 39.0–52.0)
Hemoglobin: 13.6 g/dL (ref 13.0–17.0)
MCH: 29.8 pg (ref 26.0–34.0)
MCHC: 32.1 g/dL (ref 30.0–36.0)
MCV: 92.8 fL (ref 80.0–100.0)
Platelets: 155 10*3/uL (ref 150–400)
RBC: 4.57 MIL/uL (ref 4.22–5.81)
RDW: 14.8 % (ref 11.5–15.5)
WBC: 10.7 10*3/uL — ABNORMAL HIGH (ref 4.0–10.5)
nRBC: 0 % (ref 0.0–0.2)

## 2024-03-04 LAB — BASIC METABOLIC PANEL WITH GFR
Anion gap: 8 (ref 5–15)
BUN: 10 mg/dL (ref 8–23)
CO2: 23 mmol/L (ref 22–32)
Calcium: 8.4 mg/dL — ABNORMAL LOW (ref 8.9–10.3)
Chloride: 103 mmol/L (ref 98–111)
Creatinine, Ser: 0.99 mg/dL (ref 0.61–1.24)
GFR, Estimated: >60 mL/min (ref >60)
Glucose, Bld: 87 mg/dL (ref 70–99)
Potassium: 3.7 mmol/L (ref 3.5–5.1)
Sodium: 134 mmol/L — ABNORMAL LOW (ref 135–145)

## 2024-03-04 LAB — HEMOGLOBIN AND HEMATOCRIT, BLOOD
HCT: 46 % (ref 39.0–52.0)
Hemoglobin: 14.6 g/dL (ref 13.0–17.0)

## 2024-03-04 MED ORDER — ZOLPIDEM TARTRATE 5 MG PO TABS
5.0000 mg | ORAL_TABLET | Freq: Once | ORAL | Status: AC
  Administered 2024-03-04: 5 mg via ORAL
  Filled 2024-03-04: qty 1

## 2024-03-04 MED ORDER — HYDROCORTISONE ACETATE 25 MG RE SUPP: 25.0000 mg | Freq: Two times a day (BID) | RECTAL | 0 refills | Status: AC

## 2024-03-04 MED ORDER — DICLOFENAC SODIUM 1 % EX GEL
2.0000 g | Freq: Four times a day (QID) | CUTANEOUS | 0 refills | Status: AC
Start: 2024-03-04 — End: ?

## 2024-03-04 MED ORDER — DICLOFENAC SODIUM 1 % EX GEL
4.0000 g | Freq: Four times a day (QID) | CUTANEOUS | Status: DC
  Administered 2024-03-04: 4 g via TOPICAL
  Filled 2024-03-04: qty 100

## 2024-03-04 NOTE — Discharge Summary (Signed)
 Physician Discharge Summary  DOT HENDEL ZOX:[id number] DOB: 1956-08-29 DOA: 03/03/2024  PCP: Laneta Pintos, MD  Admit date: 03/03/2024 Discharge date: 03/04/2024 Discharging to: Home Recommendations for Outpatient Follow-up:  Home  Consults:  GI Procedures:  None   Discharge Diagnoses:   Principal Problem:   Bright red blood per rectum Active Problems:   Essential hypertension   Low testosterone  in male   Primary insomnia   Body mass index (BMI) of 34.0-34.9 in adult   Murmur, cardiac   Hypertrophy of prostate   Eczema     Hospital Course:  This is a 68 year old male with arthritis, eczema, low testosterone  level, BPH, obesity who presented to the hospital for 6 episodes of bright red blood per rectum with abdominal cramping, lightheaded sensation and cool clammy skin.  Bloody stool started at 6:30 in the morning on 5/11.  He takes daily Celebrex  for his arthritis and initially stated that he was not on any blood thinners.The patient takes bisoprolol  and HCTZ as outpatient. Last hemoglobin on March 12/25 was noted to be 18.0.  Erythropoietin  level was 6.1.  His PCP wanted to recheck his hemoglobin and discuss polycythemia with him at his next visit.  When EMS arrived, blood pressure noted to be 66/33 and heart rate of 91.  It is documented in 2 separate areas that he was given 6 L of IV fluids by EMS.  In the ED: Pulse in the 70s, blood pressure 92/63 with a MAP of 74.  Pulse ox of 95% Lactic acid level was normal at 0.8, 1.0 and 0 .9. Hemoglobin was 14.3.  Hemoccults were positive. UA showed an elevated specific gravity of 1.040 suggestive of dehydration.    Subjective: This morning, the patient had nonbloody stools.  He ambulated to the bathroom himself without any issues.  We discussed that Celebrex  is a blood thinner and upon further discussion, opted to try Voltaren gel instead of Celebrex . We also discussed that he would use Anusol suppositories twice daily for  2 weeks and hold bisoprolol  HCTZ.  Principal Problem:   Bright red blood per rectum - Hemoglobin dropped from 18 to 14.6 - he would like an outpt colonoscopy - last bloody stool was 7:30 AM on 5/11 - cont Anusol x 14 days - hold Celebrex  and attempt to control his arthritis with Voltaren gel for now - He is also prescribed oxycodone  15 mg tabs which he can take a half of once a day as needed-this was last filled on 12/26/2023 for 15 tabs  Active Problems: Hypotension with a history of essential hypertension - Hold bisoprolol - HCTZ    Low testosterone  in male - Continue testosterone  replacement    Primary insomnia - Last filled zolpidem  on 02/08/2024 for 30 tabs    Body mass index (BMI) of 34.0-34.9 in adult Estimated body mass index is 36.92 kg/m as calculated from the following:   Height as of this encounter: 5\' 9"  (1.753 m).   Weight as of this encounter: 113.4 kg.         Discharge Instructions  Discharge Instructions     Diet - low sodium heart healthy   Complete by: As directed    Increase activity slowly   Complete by: As directed       Allergies as of 03/04/2024       Reactions   Hydromorphone  Hcl Other (See Comments)   Unruly   Penicillins Hives   As a child   Lovastatin Other (See Comments)  Myalgia        Medication List     STOP taking these medications    bisoprolol -hydrochlorothiazide  2.5-6.25 MG tablet Commonly known as: ZIAC    celecoxib  200 MG capsule Commonly known as: CELEBREX        TAKE these medications    albuterol  108 (90 Base) MCG/ACT inhaler Commonly known as: VENTOLIN  HFA Inhale 2 puffs into the lungs every 6 (six) hours as needed for wheezing. What changed:  how much to take when to take this   B-D 3CC LUER-LOK SYR 22GX1-1/2 22G X 1-1/2" 3 ML Misc Generic drug: SYRINGE-NEEDLE (DISP) 3 ML To use with testosterone  injections every 3 weeks   clobetasol  cream 0.05 % Commonly known as: TEMOVATE  USE AS DIRECTED 3  TIMES A DAY AS NEEDED FOR RASH. What changed: See the new instructions.   diclofenac Sodium 1 % Gel Commonly known as: Voltaren Arthritis Pain Apply 2 g topically 4 (four) times daily.   hydrocortisone 25 MG suppository Commonly known as: ANUSOL-HC Place 1 suppository (25 mg total) rectally 2 (two) times daily for 14 days.   Loratadine -D 12HR 5-120 MG tablet Generic drug: loratadine -pseudoephedrine TAKE 1 TABLET BY MOUTH 2 TIMES DAILY.   nitroGLYCERIN  0.4 MG SL tablet Commonly known as: Nitrostat  Place 1 tablet (0.4 mg total) under the tongue every 5 (five) minutes as needed for chest pain. If you require more than two tablets five minutes apart go to the nearest ER via EMS.   NON FORMULARY Take 6 tablets by mouth daily. balance of nature fruit and vegetables   oxyCODONE  15 MG immediate release tablet Commonly known as: ROXICODONE  TAKE 1/2 TABLET BY MOUTH ONCE DAILY AS NEEDED   tamsulosin  0.4 MG Caps capsule Commonly known as: FLOMAX  Take 1 capsule (0.4 mg total) by mouth in the morning and at bedtime.   testosterone  cypionate 200 MG/ML injection Commonly known as: DEPOTESTOSTERONE CYPIONATE INJECT 0.5 ML INTO THE MUSCLE EVERY 21 DAYS.   triamcinolone  55 MCG/ACT Aero nasal inhaler Commonly known as: NASACORT  Place 2 sprays into the nose daily. What changed:  how much to take when to take this   zolpidem  10 MG tablet Commonly known as: AMBIEN  TAKE 1 TABLET BY MOUTH AT BEDTIME.        Follow-up Information     Baldo Bonds, MD. Schedule an appointment as soon as possible for a visit in 4 week(s).   Specialty: Gastroenterology Why: Follow-up for outpatient colonoscopy Contact information: [address] [place] [address] [phone]                    The results of significant diagnostics from this hospitalization (including imaging, microbiology, ancillary and laboratory) are listed below for reference.    DG Chest Port 1  View Result Date: 03/03/2024 CLINICAL DATA:  sob EXAM: PORTABLE CHEST - 1 VIEW COMPARISON:  None Available. FINDINGS: Cardiac silhouette is unremarkable. No pneumothorax or pleural effusion. The lungs are clear. There are thoracic degenerative changes. IMPRESSION: No acute cardiopulmonary process. Electronically Signed   By: Sydell Eva M.D.   On: 03/03/2024 10:42   CT Angio Abd/Pel W and/or Wo Contrast Result Date: 03/03/2024 CLINICAL DATA:  Lower GI bleeding EXAM: CTA ABDOMEN AND PELVIS WITHOUT AND WITH CONTRAST TECHNIQUE: Multidetector CT imaging of the abdomen and pelvis was performed using the standard protocol during bolus administration of intravenous contrast. Multiplanar reconstructed images and MIPs were obtained and reviewed to evaluate the vascular anatomy. RADIATION DOSE REDUCTION: This exam  was performed according to the departmental dose-optimization program which includes automated exposure control, adjustment of the mA and/or kV according to patient size and/or use of iterative reconstruction technique. CONTRAST:  OMNIPAQUE  IOHEXOL  350 MG/ML SOLN COMPARISON:  None Available. FINDINGS: VASCULAR No evidence of active arterial extravasation into the GI tract. Aorta: Patent, no aneurysm. Celiac: Patent. SMA: Patent. Renals: Patent. IMA: Patent. Inflow: Patent. Proximal Outflow: Patent. Veins: No obvious venous abnormality within the limitations of this arterial phase study. Review of the MIP images confirms the above findings. NON-VASCULAR Lower chest: No acute abnormality. Hepatobiliary: No focal liver abnormality is seen. No gallstones, gallbladder wall thickening, or biliary dilatation. Pancreas: Unremarkable. No pancreatic ductal dilatation or surrounding inflammatory changes. Spleen: Normal in size without focal abnormality. Adrenals/Urinary Tract: Adrenal glands are unremarkable. Kidneys are normal, without renal calculi, focal lesion, or hydronephrosis. Bladder is unremarkable.  Stomach/Bowel: No dilated loops of bowel are seen. There is no evidence of active arterial extravasation to the GI tract. The appendix is well seen and within normal limits. Colonic diverticulosis is present, particularly in the left colon. Lymphatic: No significant vascular findings are present. No enlarged abdominal or pelvic lymph nodes. Reproductive: Nothing significant. Other: Nothing significant. Musculoskeletal: Right hip prosthesis. Postsurgical changes from posterior spinal fusion. IMPRESSION: 1. No evidence of active arterial extravasation into the GI tract. 2. Colonic diverticulosis. Electronically Signed   By: Reagan Camera M.D.   On: 03/03/2024 10:39   Labs:   Basic Metabolic Panel: Recent Labs  Lab 03/03/24 0932 03/04/24 0255  NA 135 134*  K 4.0 3.7  CL 104 103  CO2 22 23  GLUCOSE 125* 87  BUN 14 10  CREATININE 0.94 0.99  CALCIUM 8.3* 8.4*     CBC: Recent Labs  Lab 03/03/24 0932 03/03/24 1127 03/03/24 1857 03/04/24 0255 03/04/24 1048  WBC 10.8*  --   --  10.7*  --   NEUTROABS 7.7  --   --   --   --   HGB 14.3 14.2 13.2 13.6 14.6  HCT 45.1 44.4 41.9 42.4 46.0  MCV 93.4  --   --  92.8  --   PLT 165  --   --  155  --          SIGNED:   Sedalia Dacosta, MD  [facility] 03/04/2024, 12:29 PM Time taking on discharge: 50 minutes

## 2024-03-04 NOTE — Progress Notes (Signed)
 [facility] Gastroenterology Progress Note  Ronald Parsons 68 y.o. [birth year]  CC: Rectal bleeding   Subjective: Patient seen and examined at bedside.  Denies any further bleeding.  Wants to go home.  ROS : Afebrile, negative for chest pain   Objective: Vital signs in last 24 hours: Vitals:   03/03/24 2307 03/04/24 0353  BP: 133/69 118/81  Pulse: 73 91  Resp: 19 18  Temp: 98.5 F (36.9 C) 98.2 F (36.8 C)  SpO2: 100% 95%    Physical Exam:  General:  Alert, cooperative, no distress, appears stated age  Head:  Normocephalic, without obvious abnormality, atraumatic  Eyes:  , EOM's intact,   Lungs:   No visible respiratory distress  Heart:  Regular rate and rhythm, S1, S2 normal  Abdomen:   Soft, non-tender, nondistended, bowel sound present, no peritoneal signs  Extremities: Extremities normal, atraumatic, no  edema  Pulses: 2+ and symmetric    Lab Results: Recent Labs    03/03/24 0932 03/04/24 0255  NA 135 134*  K 4.0 3.7  CL 104 103  CO2 22 23  GLUCOSE 125* 87  BUN 14 10  CREATININE 0.94 0.99  CALCIUM 8.3* 8.4*   Recent Labs    03/03/24 0932  AST 25  ALT 25  ALKPHOS 53  BILITOT 0.8  PROT 5.9*  ALBUMIN 3.3*   Recent Labs    03/03/24 0932 03/03/24 1127 03/03/24 1857 03/04/24 0255  WBC 10.8*  --   --  10.7*  NEUTROABS 7.7  --   --   --   HGB 14.3   < > 13.2 13.6  HCT 45.1   < > 41.9 42.4  MCV 93.4  --   --  92.8  PLT 165  --   --  155   < > = values in this interval not displayed.   Recent Labs    03/03/24 0932  LABPROT 14.7  INR 1.1      Assessment/Plan: -Rectal bleeding.  Resolved now.  CT angio negative for any active bleeding showed diverticulosis.  Likely diverticular bleed versus hemorrhoidal bleed.  Hemoglobin normal at 14.2.  Rectal exam performed by emergency department physician showed bright red blood in the rectum.   Recommendations ------------------------- - Rectal bleeding resolved now.  Hemoglobin remains normal at 13.6.   Patient wants to go home and have outpatient colonoscopy done with his primary GI physician Dr. Honey Lusty. - Recommend to continue Anusol suppository twice a day for 2 weeks - Follow-up with Dr. Honey Lusty after discharge in a few weeks to arrange for outpatient colonoscopy - Advance diet to regular.  Okay to discharge from GI standpoint.  GI will sign off.  Call us  back if needed.  [name] MD, FACP 03/04/2024, 8:31 AM  Contact #  [phone]

## 2024-03-04 NOTE — Plan of Care (Signed)

## 2024-03-04 NOTE — Discharge Instructions (Signed)
-   cont Anusol x 14 days - hold Celebrex  if possible

## 2024-03-08 ENCOUNTER — Other Ambulatory Visit: Payer: Self-pay | Admitting: Nurse Practitioner

## 2024-03-08 ENCOUNTER — Other Ambulatory Visit

## 2024-03-08 DIAGNOSIS — D751 Secondary polycythemia: Secondary | ICD-10-CM

## 2024-03-08 DIAGNOSIS — J324 Chronic pansinusitis: Secondary | ICD-10-CM

## 2024-03-14 LAB — JAK2 GENOTYPR

## 2024-03-14 LAB — TESTOSTERONE: Testosterone: 318 ng/dL (ref 264–916)

## 2024-03-20 ENCOUNTER — Ambulatory Visit: Payer: Self-pay | Admitting: Family Medicine

## 2024-03-27 ENCOUNTER — Encounter: Payer: Self-pay | Admitting: Family Medicine

## 2024-03-27 ENCOUNTER — Telehealth: Payer: Self-pay

## 2024-03-27 ENCOUNTER — Ambulatory Visit (INDEPENDENT_AMBULATORY_CARE_PROVIDER_SITE_OTHER): Admitting: Family Medicine

## 2024-03-27 VITALS — BP 115/71 | HR 86 | Ht 69.0 in | Wt 249.5 lb

## 2024-03-27 DIAGNOSIS — I2089 Other forms of angina pectoris: Secondary | ICD-10-CM

## 2024-03-27 DIAGNOSIS — I10 Essential (primary) hypertension: Secondary | ICD-10-CM

## 2024-03-27 DIAGNOSIS — M2550 Pain in unspecified joint: Secondary | ICD-10-CM

## 2024-03-27 DIAGNOSIS — Z6834 Body mass index (BMI) 34.0-34.9, adult: Secondary | ICD-10-CM

## 2024-03-27 DIAGNOSIS — M4716 Other spondylosis with myelopathy, lumbar region: Secondary | ICD-10-CM | POA: Diagnosis not present

## 2024-03-27 DIAGNOSIS — R7989 Other specified abnormal findings of blood chemistry: Secondary | ICD-10-CM

## 2024-03-27 DIAGNOSIS — K625 Hemorrhage of anus and rectum: Secondary | ICD-10-CM

## 2024-03-27 DIAGNOSIS — D751 Secondary polycythemia: Secondary | ICD-10-CM

## 2024-03-27 DIAGNOSIS — G8929 Other chronic pain: Secondary | ICD-10-CM | POA: Insufficient documentation

## 2024-03-27 MED ORDER — NITROGLYCERIN 0.4 MG SL SUBL
0.4000 mg | SUBLINGUAL_TABLET | SUBLINGUAL | 0 refills | Status: AC | PRN
Start: 2024-03-27 — End: 2024-04-26

## 2024-03-27 MED ORDER — OXYCODONE HCL 15 MG PO TABS
ORAL_TABLET | ORAL | 0 refills | Status: DC
Start: 2024-03-27 — End: 2024-06-17

## 2024-03-27 NOTE — Assessment & Plan Note (Signed)
-   Previously elevated (18-19) - Normalized after GI bleeding episodes - EPO level and genetic testing for polycythemia negative - Will recheck at next visit

## 2024-03-27 NOTE — Telephone Encounter (Signed)
 I've sent in the refill to [facility]

## 2024-03-27 NOTE — Patient Instructions (Signed)
 It was nice to see you today,  We addressed the following topics today: --I will see back in 3 months. - I am sending in a refill of your oxycodone  - Call or talk to the company that makes Zepbound and if they can prescribe it to you for an affordable amount let me know and I can send in the prescription. - No other changes to your medications today.  Have a great day,  Etha Henle, MD

## 2024-03-27 NOTE — Telephone Encounter (Signed)
 Pt is requesting a refill of: nitroGLYCERIN  (NITROSTAT ) 0.4 MG SL tablet   Pharmacy: [facility] - [place], [place] - [address]

## 2024-03-27 NOTE — Assessment & Plan Note (Signed)
-   Currently using diclofenac  gel 2-3 times daily with good effect - Previously on Celebrex  but discontinued after recent GI bleed - Recommend trial of diclofenac  gel as primary treatment before considering resuming Celebrex  - Advised not to use both medications simultaneously

## 2024-03-27 NOTE — Progress Notes (Unsigned)
 Established Patient Office Visit  Subjective   Patient ID: Ronald Parsons, male    DOB: 03-Jun-1956  Age: 68 y.o. MRN: [id number]  Chief Complaint  Patient presents with   Follow-up   Hypertension   Weight Check    HPI Subjective - Rectal bleeding: reports 2-3 episodes per year, possibly related to diverticulosis without diverticulitis - Denies pain with bleeding episodes - Given suppositories but did not use them as bleeding resolved spontaneously - Concerned about steroid absorption from suppositories  - Joint pain: using diclofenac  gel 2-3 times daily with good effect - Previously on Celebrex  but stopped during recent illness - Plans to try diclofenac  gel as primary treatment before considering resuming Celebrex   - Weight management: interested in Zepbound (tirzepatide) - Reports sister is taking Zepbound with good results - Reports brother-in-law experienced significant weight loss on medication - Interested in weight loss to prepare for knee replacement surgery - BMI currently 36  - Blood pressure: discontinued antihypertensive medication approximately 6 weeks ago due to low readings - Previously had to discontinue BP medication for 6 months about 5 years ago - Reports weight loss in past helped control blood pressure  Medications: testosterone  1cc every 3 weeks, oxycodone  for pain, Flowmax, Ambien  PRN for sleep, Nasacort  nasal inhaler, loratadine  twice daily for allergies  PMH: diverticulosis, hypertension (currently not on medication), elevated hemoglobin (previously 18-19, now normalized), sleep apnea, joint pain requiring knee replacement, testosterone  deficiency  ROS: denies current rectal bleeding, denies pain with internal hemorrhoids   The 10-year ASCVD risk score (Arnett DK, et al., 2019) is: 14.7%  Health Maintenance Due  Topic Date Due   COVID-19 Vaccine (1) Never done   Hepatitis C Screening  Never done   DTaP/Tdap/Td (1 - Tdap) Never done    Pneumonia Vaccine 32+ Years old (1 of 2 - PCV) Never done      Objective:     BP 115/71   Pulse 86   Ht 5\' 9"  (1.753 m)   Wt 249 lb 8 oz (113.2 kg)   SpO2 97%   BMI 36.84 kg/m    Physical Exam Gen: alert, oriented Heent: perrla, eomi.   Cv: rrr, no murmur Pulm: lctab    No results found for any visits on 03/27/24.      Assessment & Plan:   Bright red blood per rectum Assessment & Plan: - recently in hospital overnight for acute onset of brbpr leading to hypotension and decrease in hgb.  - no recurrence since that time.  Not taking celebrex  at this time.   - advised him he can take voltaren  gel for his joint aches. 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 -     oxyCODONE  HCl; TAKE 1/2 TABLET BY MOUTH ONCE DAILY AS NEEDED  Dispense: 15 tablet; Refill: 0  Chronic joint pain Assessment & Plan: - Currently using diclofenac  gel 2-3 times daily with good effect - Previously on Celebrex  but discontinued after recent GI bleed - Recommend trial of diclofenac  gel as primary treatment before considering resuming Celebrex  - Advised not to use both medications simultaneously   Body mass index (BMI) of 34.0-34.9 in adult Assessment & Plan: - BMI 36, interested in weight loss medication - Discussed Zepbound (tirzepatide) options - Advised to contact [employer] directly regarding pricing options - Will provide prescription if affordable option identified  Essential hypertension Assessment & Plan: - Off antihypertensive medication for approximately 6 weeks due to low readings - Continue to monitor blood pressure   Low testosterone  in male Assessment & Plan: - Current dose: 1cc (200mg ) every 3 weeks - Recent level 380 (normal range) - Continue current regimen - Will monitor levels routinely   Polycythemia Assessment  & Plan: - Previously elevated (18-19) - Normalized after GI bleeding episodes - EPO level and genetic testing for polycythemia negative - Will recheck at next visit      Return in about 3 months (around 06/27/2024) for testosterone , weight.    Laneta Pintos, MD

## 2024-03-27 NOTE — Addendum Note (Signed)
 Addended by: Laneta Pintos on: 03/27/2024 05:57 PM   Modules accepted: Orders

## 2024-03-27 NOTE — Assessment & Plan Note (Signed)
-   Off antihypertensive medication for approximately 6 weeks due to low readings - Continue to monitor blood pressure

## 2024-03-27 NOTE — Assessment & Plan Note (Signed)
-   Current dose: 1cc (200mg ) every 3 weeks - Recent level 380 (normal range) - Continue current regimen - Will monitor levels routinely

## 2024-03-27 NOTE — Assessment & Plan Note (Signed)
-   BMI 36, interested in weight loss medication - Discussed Zepbound (tirzepatide) options - Advised to contact [employer] directly regarding pricing options - Will provide prescription if affordable option identified

## 2024-03-27 NOTE — Assessment & Plan Note (Addendum)
-   recently in hospital overnight for acute onset of brbpr leading to hypotension and decrease in hgb.  - no recurrence since that time.  Not taking celebrex  at this time.   - advised him he can take voltaren  gel for his joint aches. - Likely related to diverticulosis or internal hemorrhoids - No current bleeding reported - Will follow up with GI specialist on 03/29/2024

## 2024-03-28 NOTE — Telephone Encounter (Signed)
 LVM informing pt that the requested medication had been sent into [facility].

## 2024-04-09 ENCOUNTER — Other Ambulatory Visit: Payer: Self-pay | Admitting: Family Medicine

## 2024-04-09 DIAGNOSIS — M4716 Other spondylosis with myelopathy, lumbar region: Secondary | ICD-10-CM

## 2024-04-09 NOTE — Telephone Encounter (Signed)
 Called pt LVM to contact the office

## 2024-04-10 NOTE — Telephone Encounter (Signed)
 Called pt Ronald Parsons to contact the office

## 2024-04-11 ENCOUNTER — Telehealth: Payer: Self-pay

## 2024-04-11 NOTE — Telephone Encounter (Signed)
 Copied from CRM [id number]. Topic: Clinical - Medication Question >> Apr 11, 2024 12:28 PM Dewanda Foots wrote: Reason for CRM: Pt would like the PCP to know that the Zepbound is going to be self-pay and [facility] NPI [id number] MPPDP [id number] vial text (not the epipen  one).  I asked him which pharmacy and he states this goes directly to his house but he is not positive.  Pt callback is [phone] (home)

## 2024-04-12 ENCOUNTER — Other Ambulatory Visit: Payer: Self-pay | Admitting: Family Medicine

## 2024-04-12 MED ORDER — ZEPBOUND 2.5 MG/0.5ML ~~LOC~~ SOAJ: 2.5000 mg | SUBCUTANEOUS | 0 refills | Status: DC

## 2024-04-12 MED ORDER — TIRZEPATIDE-WEIGHT MANAGEMENT 2.5 MG/0.5ML ~~LOC~~ SOLN: 2.5000 mg | SUBCUTANEOUS | 0 refills | Status: DC

## 2024-04-12 NOTE — Telephone Encounter (Signed)
 I sent it to the [facility].  I initially sent it as the pen but I believe he needs the vial so I resent it as the vial.

## 2024-04-12 NOTE — Addendum Note (Signed)
 Addended by: Laneta Pintos on: 04/12/2024 05:18 PM   Modules accepted: Orders

## 2024-05-07 ENCOUNTER — Other Ambulatory Visit: Payer: Self-pay | Admitting: Family Medicine

## 2024-05-09 ENCOUNTER — Other Ambulatory Visit: Payer: Self-pay | Admitting: Family Medicine

## 2024-05-21 ENCOUNTER — Telehealth: Payer: Self-pay | Admitting: Family Medicine

## 2024-05-21 NOTE — Telephone Encounter (Unsigned)
 Copied from CRM [id number]. Topic: Clinical - Medication Question >> May 21, 2024 11:18 AM Donee H wrote: Reason for CRM: Patient called to check status on Zepbound . He stated he never received call with update. Patient states it's been about 3 weeks ago he placed a request. Patient states he is not going through insurance and he will do self pay. This is the information he states Dr. Chandra will need.    [facility]  MPI number  [id number] VILE TEXT( WILL NOT BE USING AUTOPENS )  Not sure if it will be sent to him or if he will have to pick it up but would like   [phone] states it's ok to leave message

## 2024-05-21 NOTE — Telephone Encounter (Signed)
 Called pt he stated that he will call Ronald Parsons direct

## 2024-06-07 ENCOUNTER — Other Ambulatory Visit: Payer: Self-pay | Admitting: Family Medicine

## 2024-06-10 ENCOUNTER — Other Ambulatory Visit: Payer: Self-pay | Admitting: Family Medicine

## 2024-06-10 MED ORDER — ZEPBOUND 2.5 MG/0.5ML ~~LOC~~ SOLN: 2.5000 mg | SUBCUTANEOUS | 1 refills | Status: DC

## 2024-06-10 NOTE — Telephone Encounter (Signed)
 Copied from CRM #[id number]. Topic: Clinical - Medication Refill >> Jun 10, 2024  1:12 PM Charlet HERO wrote: Medication: ZEPBOUND  2.5 MG/0.5ML injection vial  Has the patient contacted their pharmacy? Yes Patient is calling to have the dosage increased the DR. Wanted to know if the patient to have increased and he does  This is the patient's preferred pharmacy:   [facility] [place], [place] - [phone] [place] [address] Phone: [phone] Fax: [phone]  Is this the correct pharmacy for this prescription? Yes If no, delete pharmacy and type the correct one.   Has the prescription been filled recently? Yes  Is the patient out of the medication? Yes  Has the patient been seen for an appointment in the last year OR does the patient have an upcoming appointment? Yes  Can we respond through MyChart? No  Agent: Please be advised that Rx refills may take up to 3 business days. We ask that you follow-up with your pharmacy.

## 2024-06-17 ENCOUNTER — Other Ambulatory Visit: Payer: Self-pay | Admitting: Family Medicine

## 2024-06-17 DIAGNOSIS — M4716 Other spondylosis with myelopathy, lumbar region: Secondary | ICD-10-CM

## 2024-06-17 MED ORDER — OXYCODONE HCL 15 MG PO TABS: ORAL_TABLET | ORAL | 0 refills | Status: DC

## 2024-06-17 NOTE — Telephone Encounter (Signed)
 Copied from CRM #[id number]. Topic: Clinical - Medication Refill >> Jun 17, 2024 11:23 AM Zebedee SAUNDERS wrote: Medication: oxyCODONE  (ROXICODONE ) 15 MG immediate release tablet  Has the patient contacted their pharmacy? Yes (Agent: If no, request that the patient contact the pharmacy for the refill. If patient does not wish to contact the pharmacy document the reason why and proceed with request.) (Agent: If yes, when and what did the pharmacy advise?)Pharmacy need PCP approval.   This is the patient's preferred pharmacy:  [facility] - [place], [place] - [address] [place] [address] Phone: [phone] Fax: [phone]  Is this the correct pharmacy for this prescription? Yes If no, delete pharmacy and type the correct one.   Has the prescription been filled recently? Yes  Is the patient out of the medication? Yes  Has the patient been seen for an appointment in the last year OR does the patient have an upcoming appointment? Yes  Can we respond through MyChart? Yes  Agent: Please be advised that Rx refills may take up to 3 business days. We ask that you follow-up with your pharmacy.

## 2024-06-18 ENCOUNTER — Telehealth: Payer: Self-pay | Admitting: *Deleted

## 2024-06-18 NOTE — Telephone Encounter (Unsigned)
 Copied from CRM [id number]. Topic: Referral - Request for Referral >> Jun 18, 2024  4:02 PM Kevelyn M wrote: Did the patient discuss referral with their provider in the last year? yes (If No - schedule appointment) (If Yes - send message)  Appointment offered? No  Type of order/referral and detailed reason for visit: Needs an epidural  Preference of office, provider, location: Dr. Prentice Masters  If referral order, have you been seen by this specialty before? Yes (If Yes, this issue or another issue? When? Where?  Can we respond through MyChart? No

## 2024-06-19 ENCOUNTER — Other Ambulatory Visit: Payer: Self-pay | Admitting: Family Medicine

## 2024-06-19 DIAGNOSIS — M5416 Radiculopathy, lumbar region: Secondary | ICD-10-CM

## 2024-06-19 NOTE — Telephone Encounter (Signed)
 Tried to call patient to inform him of below and phone only rang and there was no option to LVM

## 2024-06-19 NOTE — Telephone Encounter (Signed)
 I have sent in the referral to [facility] which is where I believe Dr. Eldonna is located.

## 2024-06-20 ENCOUNTER — Other Ambulatory Visit: Payer: Self-pay | Admitting: *Deleted

## 2024-06-20 DIAGNOSIS — R7309 Other abnormal glucose: Secondary | ICD-10-CM

## 2024-06-20 DIAGNOSIS — E291 Testicular hypofunction: Secondary | ICD-10-CM

## 2024-06-20 DIAGNOSIS — I10 Essential (primary) hypertension: Secondary | ICD-10-CM

## 2024-06-21 ENCOUNTER — Other Ambulatory Visit: Payer: Self-pay

## 2024-06-21 ENCOUNTER — Other Ambulatory Visit: Payer: Self-pay | Admitting: Family Medicine

## 2024-06-21 ENCOUNTER — Other Ambulatory Visit: Payer: Self-pay | Admitting: Nurse Practitioner

## 2024-06-21 DIAGNOSIS — R7989 Other specified abnormal findings of blood chemistry: Secondary | ICD-10-CM

## 2024-06-21 DIAGNOSIS — M4716 Other spondylosis with myelopathy, lumbar region: Secondary | ICD-10-CM

## 2024-06-21 MED ORDER — BD LUER-LOK SYRINGE 22G X 1-1/2" 3 ML MISC
2 refills | Status: AC
Start: 2024-06-21 — End: ?

## 2024-06-25 ENCOUNTER — Other Ambulatory Visit: Payer: Self-pay

## 2024-06-25 DIAGNOSIS — R7989 Other specified abnormal findings of blood chemistry: Secondary | ICD-10-CM

## 2024-06-25 MED ORDER — TESTOSTERONE CYPIONATE 200 MG/ML IM SOLN: INTRAMUSCULAR | 1 refills | Status: DC

## 2024-06-26 ENCOUNTER — Other Ambulatory Visit

## 2024-06-26 DIAGNOSIS — E291 Testicular hypofunction: Secondary | ICD-10-CM

## 2024-06-26 DIAGNOSIS — R7309 Other abnormal glucose: Secondary | ICD-10-CM

## 2024-06-26 DIAGNOSIS — I10 Essential (primary) hypertension: Secondary | ICD-10-CM

## 2024-06-27 LAB — COMPREHENSIVE METABOLIC PANEL WITH GFR
ALT: 33 IU/L (ref 0–44)
AST: 31 IU/L (ref 0–40)
Albumin: 4.3 g/dL (ref 3.9–4.9)
Alkaline Phosphatase: 103 IU/L (ref 44–121)
BUN/Creatinine Ratio: 15 (ref 10–24)
BUN: 15 mg/dL (ref 8–27)
Bilirubin Total: 0.7 mg/dL (ref 0.0–1.2)
CO2: 22 mmol/L (ref 20–29)
Calcium: 9.4 mg/dL (ref 8.6–10.2)
Chloride: 101 mmol/L (ref 96–106)
Creatinine, Ser: 1.03 mg/dL (ref 0.76–1.27)
Globulin, Total: 2.5 g/dL (ref 1.5–4.5)
Glucose: 89 mg/dL (ref 70–99)
Potassium: 4.3 mmol/L (ref 3.5–5.2)
Sodium: 136 mmol/L (ref 134–144)
Total Protein: 6.8 g/dL (ref 6.0–8.5)
eGFR: 79 mL/min/1.73 (ref >59)

## 2024-06-27 LAB — CBC WITH DIFFERENTIAL/PLATELET
Basophils Absolute: 0.1 x10E3/uL (ref 0.0–0.2)
Basos: 1 %
EOS (ABSOLUTE): 0.4 x10E3/uL (ref 0.0–0.4)
Eos: 6 %
Hematocrit: 52.6 % — ABNORMAL HIGH (ref 37.5–51.0)
Hemoglobin: 16.5 g/dL (ref 13.0–17.7)
Immature Grans (Abs): 0 x10E3/uL (ref 0.0–0.1)
Immature Granulocytes: 0 %
Lymphocytes Absolute: 1.8 x10E3/uL (ref 0.7–3.1)
Lymphs: 26 %
MCH: 26.8 pg (ref 26.6–33.0)
MCHC: 31.4 g/dL — ABNORMAL LOW (ref 31.5–35.7)
MCV: 85 fL (ref 79–97)
Monocytes Absolute: 0.9 x10E3/uL (ref 0.1–0.9)
Monocytes: 13 %
Neutrophils Absolute: 3.9 x10E3/uL (ref 1.4–7.0)
Neutrophils: 54 %
Platelets: 188 x10E3/uL (ref 150–450)
RBC: 6.16 x10E6/uL — ABNORMAL HIGH (ref 4.14–5.80)
RDW: 17.8 % — ABNORMAL HIGH (ref 11.6–15.4)
WBC: 7.2 x10E3/uL (ref 3.4–10.8)

## 2024-06-27 LAB — HEMOGLOBIN A1C
Est. average glucose Bld gHb Est-mCnc: 114 mg/dL
Hgb A1c MFr Bld: 5.6 % (ref 4.8–5.6)

## 2024-06-27 LAB — LIPID PANEL
Chol/HDL Ratio: 3.5 ratio (ref 0.0–5.0)
Cholesterol, Total: 131 mg/dL (ref 100–199)
HDL: 37 mg/dL — ABNORMAL LOW (ref >39)
LDL Chol Calc (NIH): 80 mg/dL (ref 0–99)
Triglycerides: 69 mg/dL (ref 0–149)
VLDL Cholesterol Cal: 14 mg/dL (ref 5–40)

## 2024-06-27 LAB — TESTOSTERONE: Testosterone: 1229 ng/dL — ABNORMAL HIGH (ref 264–916)

## 2024-06-27 LAB — TSH: TSH: 1.65 u[IU]/mL (ref 0.450–4.500)

## 2024-06-28 ENCOUNTER — Ambulatory Visit: Payer: Self-pay

## 2024-07-03 ENCOUNTER — Encounter: Payer: Self-pay | Admitting: Family Medicine

## 2024-07-03 ENCOUNTER — Ambulatory Visit (INDEPENDENT_AMBULATORY_CARE_PROVIDER_SITE_OTHER): Admitting: Family Medicine

## 2024-07-03 VITALS — BP 148/89 | HR 85 | Ht 69.0 in | Wt 244.1 lb

## 2024-07-03 DIAGNOSIS — R7989 Other specified abnormal findings of blood chemistry: Secondary | ICD-10-CM

## 2024-07-03 DIAGNOSIS — Z6834 Body mass index (BMI) 34.0-34.9, adult: Secondary | ICD-10-CM

## 2024-07-03 DIAGNOSIS — I10 Essential (primary) hypertension: Secondary | ICD-10-CM | POA: Diagnosis not present

## 2024-07-03 MED ORDER — TESTOSTERONE CYPIONATE 200 MG/ML IM SOLN: INTRAMUSCULAR | 1 refills | Status: AC

## 2024-07-03 NOTE — Patient Instructions (Signed)
 It was nice to see you today,  We addressed the following topics today: -I am sending in your testosterone .  I would take it at 0.8 mL. - We will recheck your rhythm 6 months - Try to stay at your Zepbound  dose of 5 mg until you notice that you are not losing weight anymore.  Then we can increase it to 7.5.  Have a great day,  Rolan Slain, MD

## 2024-07-03 NOTE — Progress Notes (Unsigned)
   Established Patient Office Visit  Subjective   Patient ID: Ronald Parsons, male    DOB: 13-Mar-1956  Age: 68 y.o. MRN: [id number]  Chief Complaint  Patient presents with   Medical Management of Chronic Issues    HPI  Subjective - Follow-up for testosterone  management and weight loss. - Reports no further episodes of GI bleeding since last visit. Confirms taking Celebrex , but denies use of ibuprofen, naproxen, or Aleve. - Reports 15 lb weight loss on Zepbound . Has increased dose from 2.5 mg to 5 mg weekly due to increased hunger toward the end of the dosing cycle on the lower dose. - Reports occasional elevated home blood pressure readings, but has not been taking hydrochlorothiazide /bisoprolol  for approximately 6 months as pressures normalized.  Medications: Celebrex , Testosterone  cypionate 200 mg/mL, Zepbound  5 mg weekly. Stopped hydrochlorothiazide /bisoprolol  approximately 6 months ago.  PMH, PSH, FH, Social Hx: PMHx: GI bleed, hypertension, internal hemorrhoids, disc issue requiring back surgery in 1991. Social Hx: Reports decreased water intake compared to the past.  ROS: GI: No rectal bleeding. No melena. CONSTITUTIONAL: Reports 15 lb weight loss.  Objective CV: Blood pressure elevated in office.  Assessment and Plan Hypogonadism Currently on self-injected testosterone  200 mg/mL, has been taking 1 mL every 3 weeks. Recent lab work showed a testosterone  level of 1200, which is elevated. Hematocrit was slightly high, possibly due to dehydration, but hemoglobin was normal. Plan is to adjust dose and monitor. - Decrease testosterone  injection to 0.75 mL every 21 days. Sent new prescription to [facility]. - Recheck testosterone , estrogen, and PSA levels in 6 months.  Weight Management Reports good effect from Zepbound  with a 15 lb weight loss. Has recently increased dose to 5 mg weekly. - Continue Zepbound  5 mg weekly. - Advised to maintain current dose as long as weight loss  continues. May consider increasing dose if weight loss plateaus.  Hypertension BP elevated in office, consistent with white coat hypertension. Reports having stopped hydrochlorothiazide /bisoprolol  about 6 months ago as home BPs were normal. - Advised to continue monitoring blood pressure at home. - If home readings are consistently elevated, restart hydrochlorothiazide /bisoprolol .  Follow-up - Return to clinic in 6 months for follow-up and lab review.   The 10-year ASCVD risk score (Arnett DK, et al., 2019) is: 21.1%  Health Maintenance Due  Topic Date Due   COVID-19 Vaccine (1) Never done   Hepatitis C Screening  Never done   DTaP/Tdap/Td (1 - Tdap) Never done   Pneumococcal Vaccine: 50+ Years (1 of 2 - PCV) Never done   Influenza Vaccine  05/24/2024      Objective:     BP (!) 148/88   Pulse 85   Ht 5' 9 (1.753 m)   Wt 244 lb 1.9 oz (110.7 kg)   SpO2 97%   BMI 36.05 kg/m  {Vitals History (Optional):23777}  Physical Exam   No results found for any visits on 07/03/24.      Assessment & Plan:   There are no diagnoses linked to this encounter.   No follow-ups on file.    Toribio MARLA Slain, MD

## 2024-07-06 ENCOUNTER — Other Ambulatory Visit: Payer: Self-pay | Admitting: Family Medicine

## 2024-07-06 DIAGNOSIS — F5101 Primary insomnia: Secondary | ICD-10-CM

## 2024-07-06 NOTE — Assessment & Plan Note (Signed)
 BP elevated in office, consistent with white coat hypertension. Reports having stopped hydrochlorothiazide /bisoprolol  about 6 months ago as home BPs were normal. - Advised to continue monitoring blood pressure at home. - If home readings are consistently elevated, restart hydrochlorothiazide /bisoprolol .

## 2024-07-06 NOTE — Assessment & Plan Note (Signed)
 Reports good effect from Zepbound  with a 15 lb weight loss. Has recently increased dose to 5 mg weekly. - Continue Zepbound  5 mg weekly. - Advised to maintain current dose as long as weight loss continues. May consider increasing dose if weight loss plateaus.

## 2024-07-06 NOTE — Assessment & Plan Note (Signed)
 Currently on self-injected testosterone  200 mg/mL, has been taking 1 mL every 3 weeks. Recent lab work showed a testosterone  level of 1200, which is elevated. Hematocrit was slightly high, possibly due to dehydration, but hemoglobin was normal. Plan is to adjust dose and monitor. - Decrease testosterone  injection to 0.75 mL every 21 days. Sent new prescription to [facility]. - Recheck testosterone , estrogen, and PSA levels in 6 months.

## 2024-07-08 ENCOUNTER — Telehealth: Payer: Self-pay

## 2024-07-08 NOTE — Telephone Encounter (Signed)
 Copied from CRM #[id number]. Topic: Clinical - Medication Question >> Jul 08, 2024  4:29 PM Viola F wrote: Patient was seen 07/03/24 and was told the zolpidem  (AMBIEN ) 10 MG tablet medication would be sent his pharmacy but it wasn't sent - would like it sent to the [facility] - [place], [place] - [address] as soon as possible and would like it filled until his next appt in March

## 2024-07-10 ENCOUNTER — Ambulatory Visit (INDEPENDENT_AMBULATORY_CARE_PROVIDER_SITE_OTHER): Admitting: Physical Medicine and Rehabilitation

## 2024-07-10 ENCOUNTER — Other Ambulatory Visit (INDEPENDENT_AMBULATORY_CARE_PROVIDER_SITE_OTHER): Payer: Self-pay

## 2024-07-10 ENCOUNTER — Encounter: Payer: Self-pay | Admitting: Physical Medicine and Rehabilitation

## 2024-07-10 DIAGNOSIS — M542 Cervicalgia: Secondary | ICD-10-CM

## 2024-07-10 DIAGNOSIS — R202 Paresthesia of skin: Secondary | ICD-10-CM

## 2024-07-10 DIAGNOSIS — G8929 Other chronic pain: Secondary | ICD-10-CM | POA: Diagnosis not present

## 2024-07-10 DIAGNOSIS — M5412 Radiculopathy, cervical region: Secondary | ICD-10-CM | POA: Diagnosis not present

## 2024-07-10 NOTE — Progress Notes (Signed)
 Pain Scale   Average Pain 5 Patient advising he has neck pain radiating bilaterally to shoulders and hands, patient advising he has numbness and tingling in both hands and fingers.        +Driver, -BT, -Dye Allergies.

## 2024-07-10 NOTE — Progress Notes (Signed)
 Ronald Parsons - 68 y.o. male MRN [id number]  Date of birth: 1956-03-27  Office Visit Note: Visit Date: 07/10/2024 PCP: Ronald Toribio POUR, MD Referred by: Ronald Toribio POUR, MD  Subjective: Chief Complaint  Patient presents with   Neck - Pain   HPI: Ronald Parsons is a 68 y.o. male who comes in today per the request of Dr. Toribio Ronald for evaluation of chronic, worsening and severe bilateral neck pain radiating to shoulders. Also reports numbness/tingling to bilateral forearms and fingers. We have seen him in the past for more lower and middle back issues. He was previously diagnosed with DISH. He reports issues with fine motor skills, dropping bolts when working on his car. Pain ongoing for several years, worsened over the last 6 months. Symptoms worsen with movement and activity. Reaching above his head also causes pain. He describes pain as sore, aching and sharp, currently rates as 8 out of 10. Some relief of pain with home exercise regimen, rest and use of medications. Cervical MRI imaging from 2014 shows left foraminal encroachment C5-C6 due to a large osteophyte. There is mild to moderate spinal stenosis at C6-C7 due to spondylosis. He was previously managed by Dr. Victory Parsons, he underwent left C7-T1 interlaminar epidural steroid injection with Dr. Gens in 2014. Good relief of pain with this procedure. No recent radiographs/MRI imaging of cervical spine. No recent trauma or falls.      Review of Systems  Musculoskeletal:  Positive for neck pain.  Neurological:  Positive for tingling. Negative for focal weakness and weakness.  All other systems reviewed and are negative.  Otherwise per HPI.  Assessment & Plan: Visit Diagnoses:    ICD-10-CM   1. Neck pain, chronic  M54.2 MR CERVICAL SPINE WO CONTRAST   G89.29 XR Cervical Spine 2 or 3 views    2. Radiculopathy, cervical region  M54.12 MR CERVICAL SPINE WO CONTRAST    XR Cervical Spine 2 or 3 views    3. Paresthesia of skin   R20.2 MR CERVICAL SPINE WO CONTRAST    XR Cervical Spine 2 or 3 views       Plan: Findings:  Chronic, worsening and severe bilateral neck pain radiating to shoulders, paresthesias to bilateral forearms and fingers.  Patient continues to have severe pain despite good conservative therapy such as home exercise regimen, rest and use of medications.  Patient's clinical presentation and exam are consistent with cervical radiculopathy.  Prior cervical MRI imaging from 2014 does show foraminal narrowing and mild to moderate central canal stenosis.  He does have a history of DISH. I obtained cervical radiographs in the office today that show DISH, multi level degenerative changes and facet arthropathy.   We discussed treatment plan in detail today. Next step is to place order for cervical MRI imaging. Depending on results of MRI imaging we discussed possibility of performing cervical epidural steroid injection. He has no questions at this time. We will see him back for cervical MRI review. His exam today is non focal, good strength noted to bilateral upper extremities.      Meds & Orders: No orders of the defined types were placed in this encounter.   Orders Placed This Encounter  Procedures   MR CERVICAL SPINE WO CONTRAST   XR Cervical Spine 2 or 3 views    Follow-up: Return for Cervical MRI review.   Procedures: No procedures performed      Clinical History: RADIOLOGY REPORT*   Clinical Data: Hand numbness  left neck and arm   MRI CERVICAL SPINE WITHOUT CONTRAST   Technique: Multiplanar and multiecho pulse sequences of the cervical spine, to include the craniocervical junction and cervicothoracic junction, were obtained according to standard protocol without intravenous contrast.   Comparison: Cervical radiographs 01/11/2013   Findings: Normal cervical alignment. Negative for fracture or mass. Cervical spinal cord signal is normal.   C2-3: Negative   C3-4: Mild disc degeneration.  Facet hypertrophy on the right with mild right foraminal encroachment. No cord deformity.   C4-5: Mild disc and facet degeneration without stenosis.   C5-6: Large left-sided osteophyte with left foraminal encroachment and impingement of the left C6 nerve root. There is cord flattening and mild spinal stenosis due to spurring. Right foramen is patent.   C6-7: Disc degeneration and spondylosis. Diffuse uncinate spurring is present, left greater than right. There is flattening of the cord with mild to moderate spinal stenosis. There is foraminal narrowing bilaterally, left greater than right.   C7-T1: Negative   IMPRESSION: Left foraminal encroachment C5-6 due to a large osteophyte. There is mild spinal stenosis at this level.   Mild to moderate spinal stenosis at C6-7 due to spondylosis. There is foraminal encroachment, left greater than right due to spurring.     Original Report Authenticated By: Alm Gaskins, M.D.   He reports that he has quit smoking. He has been exposed to tobacco smoke. His smokeless tobacco use includes chew.  Recent Labs    06/25/24 1351  HGBA1C 5.6    Objective:  VS:  HT:    WT:   BMI:     BP:   HR: bpm  TEMP: ( )  RESP:  Physical Exam Vitals and nursing note reviewed.  HENT:     Head: Normocephalic and atraumatic.     Right Ear: External ear normal.     Left Ear: External ear normal.     Nose: Nose normal.     Mouth/Throat:     Mouth: Mucous membranes are moist.  Eyes:     Extraocular Movements: Extraocular movements intact.  Cardiovascular:     Rate and Rhythm: Normal rate.     Pulses: Normal pulses.  Pulmonary:     Effort: Pulmonary effort is normal.  Abdominal:     General: Abdomen is flat. There is no distension.  Musculoskeletal:        General: Tenderness present.     Cervical back: Tenderness present.     Comments: Discomfort noted with flexion, extension and side-to-side rotation. Patient has good strength in the upper  extremities including 5 out of 5 strength in wrist extension, long finger flexion and APB. Pain noted with bilateral shoulder range of motion. There is no atrophy of the hands intrinsically. Sensation intact bilaterally. Negative Hoffman's sign. Negative Spurling's sign.     Skin:    General: Skin is warm and dry.     Capillary Refill: Capillary refill takes less than 2 seconds.  Neurological:     General: No focal deficit present.     Mental Status: He is alert and oriented to person, place, and time.  Psychiatric:        Mood and Affect: Mood normal.        Behavior: Behavior normal.     Ortho Exam  Imaging: No results found.   Past Medical/Family/Surgical/Social History: Medications & Allergies reviewed per EMR, new medications updated. Patient Active Problem List   Diagnosis Date Noted   Chronic joint pain 03/27/2024  Eczema 03/04/2024   Bright red blood per rectum 03/03/2024   Polycythemia 12/26/2023   Enlarged skin mole 05/26/2023   Rash 05/26/2023   Dyslipidemia, goal LDL below 100 01/16/2023   Other fatigue 01/16/2023   Hypertrophy of prostate 01/16/2023   Chronic right hip pain 01/16/2023   Loud snoring 10/17/2022   Excessive daytime sleepiness 10/17/2022   Wheezing 06/09/2022   Dysuria 03/06/2022   Elevated prolactin level 02/14/2022   Murmur, cardiac 11/15/2021   Essential hypertension 10/18/2021   Low testosterone  in male 10/18/2021   Primary insomnia 10/18/2021   Body mass index (BMI) of 34.0-34.9 in adult 10/18/2021   Angina pectoris 06/09/2020   Lumbar radiculopathy 07/29/2013   Past Medical History:  Diagnosis Date   Asthma    allergy related   Cervical radiculitis    Depression    DISH (diffuse idiopathic skeletal hyperostosis)    Gout    Hyperlipidemia    Hypertension    Lumbago    Osteoarthritis    Family History  Problem Relation Age of Onset   Heart disease Father    Hypertension Father    Breast cancer Sister    Valvular heart  disease Maternal Grandfather    Emphysema Paternal Grandfather    Past Surgical History:  Procedure Laterality Date   BACK SURGERY  10/24/1989   laminectomy   CARDIAC CATHETERIZATION     5-7 years ago, states it was clear   COLONOSCOPY     HERNIA REPAIR     inguinal hernia repair as a baby on right groin   HIP SURGERY Right    01/2023   KNEE SURGERY Right    2023   RIGHT/LEFT HEART CATH AND CORONARY ANGIOGRAPHY N/A 06/09/2020   Procedure: RIGHT/LEFT HEART CATH AND CORONARY ANGIOGRAPHY;  Surgeon: Ladona Heinz, MD;  Location: [facility];  Service: Cardiovascular;  Laterality: N/A;   SHOULDER SURGERY Bilateral    2017   Social History   Occupational History   Not on file  Tobacco Use   Smoking status: Former    Passive exposure: Past   Smokeless tobacco: Current    Types: Chew  Vaping Use   Vaping status: Never Used  Substance and Sexual Activity   Alcohol use: Yes    Comment: once monthly   Drug use: No   Sexual activity: Not Currently

## 2024-07-16 ENCOUNTER — Telehealth: Payer: Self-pay | Admitting: Radiology

## 2024-07-16 NOTE — Telephone Encounter (Signed)
 Patient left voicemail that he was having phone trouble and may have missed call to schedule MRI. I left voicemail for him giving him the number to [facility], [phone], and advised he can call them to schedule appointment.

## 2024-07-20 ENCOUNTER — Other Ambulatory Visit

## 2024-07-21 ENCOUNTER — Ambulatory Visit
Admission: RE | Admit: 2024-07-21 | Discharge: 2024-07-21 | Disposition: A | Discharge status: Home / Self Care | Source: Ambulatory Visit | Attending: Physical Medicine and Rehabilitation | Admitting: Physical Medicine and Rehabilitation

## 2024-07-24 ENCOUNTER — Telehealth: Payer: Self-pay | Admitting: Physical Medicine and Rehabilitation

## 2024-07-24 NOTE — Telephone Encounter (Signed)
 Patient called and said he did his MRI and get an appointment for the back injection. CB#[id number] or [phone]

## 2024-07-29 ENCOUNTER — Other Ambulatory Visit: Payer: Self-pay | Admitting: Family Medicine

## 2024-08-05 ENCOUNTER — Ambulatory Visit: Admitting: Physical Medicine and Rehabilitation

## 2024-08-05 ENCOUNTER — Encounter: Payer: Self-pay | Admitting: Physical Medicine and Rehabilitation

## 2024-08-05 DIAGNOSIS — M4802 Spinal stenosis, cervical region: Secondary | ICD-10-CM

## 2024-08-05 DIAGNOSIS — M5412 Radiculopathy, cervical region: Secondary | ICD-10-CM | POA: Diagnosis not present

## 2024-08-05 DIAGNOSIS — R202 Paresthesia of skin: Secondary | ICD-10-CM | POA: Diagnosis not present

## 2024-08-05 NOTE — Progress Notes (Signed)
 Ronald Parsons - 68 y.o. male MRN [id number]  Date of birth: 06/29/1956  Office Visit Note: Visit Date: 08/05/2024 PCP: Chandra Toribio POUR, MD Referred by: Chandra Toribio POUR, MD  Subjective: Chief Complaint  Patient presents with   Neck - Pain   HPI: Ronald Parsons is a 68 y.o. male who comes in today for evaluation of chronic, worsening and severe bilateral neck pain radiating to shoulders. Also reports numbness/tingling to bilateral forearms and fingers, left greater than right. Symptoms ongoing for several years, worsens with movement and activity, currently rates as 8 out of 10. He also reports issues with fine motor skills, dropping bolts when working on his car. Some relief of pain with home exercise regimen, rest and use of medications. Recent cervical MRI imaging shows multi level foraminal stenosis. There is severe bilateral foraminal stenosis and borderline central stenosis at C5-C6, worsened from prior. Moderate canal stenosis at C6-C7. He was previously managed by Dr. Victory Gens, he underwent left C7-T1 interlaminar epidural steroid injection with Dr. Gens in 2014. Good relief of pain with this procedure. Patient denies recent trauma or falls.       Review of Systems  Musculoskeletal:  Positive for neck pain.  Neurological:  Positive for tingling. Negative for focal weakness and weakness.  All other systems reviewed and are negative.  Otherwise per HPI.  Assessment & Plan: Visit Diagnoses:    ICD-10-CM   1. Radiculopathy, cervical region  M54.12 Ambulatory referral to Physical Medicine Rehab    2. Foraminal stenosis of cervical region  M48.02 Ambulatory referral to Physical Medicine Rehab    3. Paresthesia of skin  R20.2 Ambulatory referral to Physical Medicine Rehab       Plan: Findings:  Chronic, worsening and severe bilateral neck pain radiating to shoulders. Also reports paresthesias to bilateral forearms and fingers, left greater than right. Patient continues  to have symptoms despite good conservative therapies such as home exercise regimen, rest and use of medications. I discussed recent cervical MRI with patient today using imaging and spine model. There is multi level foraminal stenosis, most severe at C5-C6, there is moderate central canal stenosis at C6-C7. We discussed treatment plan in detail today. Next step is to perform left C7-T1 interlaminar epidural steroid injection under fluoroscopic guidance. He is not currently taking anticoagulant medication. If good relief of pain with injection we can repeat this procedure infrequently as needed. His exam today is non focal, good strength to bilateral upper extremities. No red flag symptoms noted upon exam today.     Meds & Orders: No orders of the defined types were placed in this encounter.   Orders Placed This Encounter  Procedures   Ambulatory referral to Physical Medicine Rehab    Follow-up: Return for Left C7-T1 interlaminar epidural steroid injection.   Procedures: No procedures performed      Clinical History: Narrative & Impression EXAM: MRI CERVICAL SPINE WITHOUT CONTRAST 07/21/2024 02:08:33 PM   TECHNIQUE: Multiplanar multisequence MRI of the cervical spine was performed.   COMPARISON: 01/17/2013   CLINICAL HISTORY: Chronic neck pain for 4 months with bilateral arm weakness/numbness.   FINDINGS:   BONES AND ALIGNMENT: 1.5 mm degenerative anterolisthesis at C7-T1. Normal vertebral body heights. Type 1 degenerative endplate findings at C5-C6, C6-C7, and T1-T2. Disc desiccation throughout the cervical spine noted with loss of disc height especially at C5-C6 and C6-C7.   SPINAL CORD: Normal spinal cord size. No abnormal spinal cord signal.   SOFT TISSUES: No paraspinal  mass.   C1-C2: Small effusion at the right C1-C2 articulation.   C2-C3: Moderate to prominent left and moderate right foraminal stenosis related to facet spurring and to a lesser degree uncinate  spurring.   C3-C4: Moderate right and mild left foraminal stenosis due to facet spurring.   C4-C5: No impingement. Bilateral facet joint spurring.   C5-C6: Severe bilateral foraminal stenosis and borderline central stenosis due to disk osteophyte complex and facet arthropathy. Worsened from prior.   C6-C7: Prominent bilateral foraminal stenosis and moderate central stenosis due to disc bulge, uncinate spurring, and facet arthropathy. Mildly worsened from prior.   C7-T1: No impingement. Mild bilateral degenerative facet arthropathy.   T1-T2: Moderate right foraminal stenosis due to right foraminal disc protrusion and facet arthropathy. This level was only included on the sagittal/parasagittal images.   T2-T3: Moderate right foraminal stenosis due to disc bulge and facet arthropathy. This level was only included on the sagittal/parasagittal images.   IMPRESSION: 1. Severe bilateral foraminal stenosis and borderline central stenosis at C5-6, worsened from prior. 2. Prominent bilateral foraminal stenosis and moderate central stenosis at C6-7, mildly worsened from prior. 3. Moderate to prominent left and moderate right foraminal stenosis at C2-3. 4. Moderate right foraminal stenosis at T2-3. 5. Moderate right foraminal stenosis at T1-2. 6. Moderate right foraminal stenosis and mild left foraminal stenosis at C3-4.   Electronically signed by: Ryan Salvage MD 07/24/2024 03:21 PM EDT RP Workstation: [id number]   He reports that he has quit smoking. He has been exposed to tobacco smoke. His smokeless tobacco use includes chew.  Recent Labs    06/25/24 1351  HGBA1C 5.6    Objective:  VS:  HT:    WT:   BMI:     BP:   HR: bpm  TEMP: ( )  RESP:  Physical Exam Vitals and nursing note reviewed.  HENT:     Head: Normocephalic and atraumatic.     Right Ear: External ear normal.     Left Ear: External ear normal.     Nose: Nose normal.     Mouth/Throat:     Mouth:  Mucous membranes are moist.  Eyes:     Extraocular Movements: Extraocular movements intact.  Cardiovascular:     Rate and Rhythm: Normal rate.     Pulses: Normal pulses.  Pulmonary:     Effort: Pulmonary effort is normal.  Abdominal:     General: Abdomen is flat. There is no distension.  Musculoskeletal:        General: Tenderness present.     Cervical back: Tenderness present.     Comments: Discomfort noted with flexion, extension and side-to-side rotation. Patient has good strength in the upper extremities including 5 out of 5 strength in wrist extension, long finger flexion and APB. Pain noted with bilateral shoulder range of motion. There is no atrophy of the hands intrinsically. Sensation intact bilaterally. Negative Hoffman's sign. Negative Spurling's sign.   Skin:    General: Skin is warm and dry.     Capillary Refill: Capillary refill takes less than 2 seconds.  Neurological:     General: No focal deficit present.     Mental Status: He is alert and oriented to person, place, and time.  Psychiatric:        Mood and Affect: Mood normal.        Behavior: Behavior normal.     Ortho Exam  Imaging: No results found.  Past Medical/Family/Surgical/Social History: Medications & Allergies reviewed per EMR, new  medications updated. Patient Active Problem List   Diagnosis Date Noted   Chronic joint pain 03/27/2024   Eczema 03/04/2024   Bright red blood per rectum 03/03/2024   Polycythemia 12/26/2023   Enlarged skin mole 05/26/2023   Rash 05/26/2023   Dyslipidemia, goal LDL below 100 01/16/2023   Other fatigue 01/16/2023   Hypertrophy of prostate 01/16/2023   Chronic right hip pain 01/16/2023   Loud snoring 10/17/2022   Excessive daytime sleepiness 10/17/2022   Wheezing 06/09/2022   Dysuria 03/06/2022   Elevated prolactin level 02/14/2022   Murmur, cardiac 11/15/2021   Essential hypertension 10/18/2021   Low testosterone  in male 10/18/2021   Primary insomnia 10/18/2021    Body mass index (BMI) of 34.0-34.9 in adult 10/18/2021   Angina pectoris 06/09/2020   Lumbar radiculopathy 07/29/2013   Past Medical History:  Diagnosis Date   Asthma    allergy related   Cervical radiculitis    Depression    DISH (diffuse idiopathic skeletal hyperostosis)    Gout    Hyperlipidemia    Hypertension    Lumbago    Osteoarthritis    Family History  Problem Relation Age of Onset   Heart disease Father    Hypertension Father    Breast cancer Sister    Valvular heart disease Maternal Grandfather    Emphysema Paternal Grandfather    Past Surgical History:  Procedure Laterality Date   BACK SURGERY  10/24/1989   laminectomy   CARDIAC CATHETERIZATION     5-7 years ago, states it was clear   COLONOSCOPY     HERNIA REPAIR     inguinal hernia repair as a baby on right groin   HIP SURGERY Right    01/2023   KNEE SURGERY Right    2023   RIGHT/LEFT HEART CATH AND CORONARY ANGIOGRAPHY N/A 06/09/2020   Procedure: RIGHT/LEFT HEART CATH AND CORONARY ANGIOGRAPHY;  Surgeon: Ladona Heinz, MD;  Location: [facility];  Service: Cardiovascular;  Laterality: N/A;   SHOULDER SURGERY Bilateral    2017   Social History   Occupational History   Not on file  Tobacco Use   Smoking status: Former    Passive exposure: Past   Smokeless tobacco: Current    Types: Chew  Vaping Use   Vaping status: Never Used  Substance and Sexual Activity   Alcohol use: Yes    Comment: once monthly   Drug use: No   Sexual activity: Not Currently

## 2024-08-05 NOTE — Progress Notes (Signed)
 Pain Scale   Average Pain 7 Patient advising he has chronic neck pain with out relief. Patient here for MRI review        +Driver, -BT, -Dye Allergies.

## 2024-08-27 ENCOUNTER — Other Ambulatory Visit: Payer: Self-pay

## 2024-08-27 ENCOUNTER — Ambulatory Visit (INDEPENDENT_AMBULATORY_CARE_PROVIDER_SITE_OTHER): Admitting: Physical Medicine and Rehabilitation

## 2024-08-27 VITALS — BP 134/88 | HR 87

## 2024-08-27 DIAGNOSIS — M5412 Radiculopathy, cervical region: Secondary | ICD-10-CM

## 2024-08-27 MED ORDER — METHYLPREDNISOLONE ACETATE 40 MG/ML IJ SUSP
40.0000 mg | Freq: Once | INTRAMUSCULAR | Status: AC
  Administered 2024-08-27: 40 mg

## 2024-08-27 NOTE — Progress Notes (Signed)
 Pain Scale   Average Pain 8 Patient advising he has neck pain radiating bilaterally to shoulders, pain increases when moving heas and decreases when using a Theragun.        +Driver, -BT, -Dye Allergies.

## 2024-09-02 ENCOUNTER — Other Ambulatory Visit: Payer: Self-pay | Admitting: Family Medicine

## 2024-09-02 NOTE — Telephone Encounter (Unsigned)
 Copied from CRM [id number]. Topic: Clinical - Medication Refill >> Sep 02, 2024  4:41 PM Jasmin G wrote: Medication: tirzepatide  (ZEPBOUND ) 2.5 MG/0.5ML injection vial. Pt states that it needs to be upped to the next level.  Has the patient contacted their pharmacy? No (Agent: If no, request that the patient contact the pharmacy for the refill. If patient does not wish to contact the pharmacy document the reason why and proceed with request.) (Agent: If yes, when and what did the pharmacy advise?)  This is the patient's preferred pharmacy: [facility] [place], [place] - [id number] [place] [address] Phone: [phone] Fax: [phone]  Is this the correct pharmacy for this prescription? Yes If no, delete pharmacy and type the correct one.   Has the prescription been filled recently? Yes  Is the patient out of the medication? No  Has the patient been seen for an appointment in the last year OR does the patient have an upcoming appointment? Yes  Can we respond through MyChart? No  Agent: Please be advised that Rx refills may take up to 3 business days. We ask that you follow-up with your pharmacy.

## 2024-09-02 NOTE — Telephone Encounter (Signed)
 Pt states that it needs to be upped to the next level.

## 2024-09-03 MED ORDER — TIRZEPATIDE-WEIGHT MANAGEMENT 5 MG/0.5ML ~~LOC~~ SOLN: 5.0000 mg | SUBCUTANEOUS | 3 refills | Status: AC

## 2024-09-08 NOTE — Progress Notes (Signed)
 Ronald Parsons - 69 y.o. male MRN [id number]  Date of birth: [birth year]  Office Visit Note: Visit Date: 08/27/2024 PCP: Chandra Toribio POUR, MD Referred by: Chandra Toribio POUR, MD  Subjective: Chief Complaint  Patient presents with   Neck - Pain   HPI:  Ronald Parsons is a 68 y.o. male who comes in today at the request of Duwaine Pouch, FNP for planned Left C7-T1 Cervical Interlaminar epidural steroid injection with fluoroscopic guidance.  The patient has failed conservative care including home exercise, medications, time and activity modification.  This injection will be diagnostic and hopefully therapeutic.  Please see requesting physician notes for further details and justification.   ROS Otherwise per HPI.  Assessment & Plan: Visit Diagnoses:    ICD-10-CM   1. Cervical radiculopathy  M54.12 XR C-ARM NO REPORT    Epidural Steroid injection    methylPREDNISolone  acetate (DEPO-MEDROL ) injection 40 mg      Plan: No additional findings.   Meds & Orders:  Meds ordered this encounter  Medications   methylPREDNISolone  acetate (DEPO-MEDROL ) injection 40 mg    Orders Placed This Encounter  Procedures   XR C-ARM NO REPORT   Epidural Steroid injection    Follow-up: Return for visit to requesting provider as needed.   Procedures: No procedures performed  Cervical Epidural Steroid Injection - Interlaminar Approach with Fluoroscopic Guidance  Patient: Ronald Parsons      Date of Birth: May 23, 1956 MRN: [id number] PCP: Chandra Toribio POUR, MD      Visit Date: 08/27/2024   Universal Protocol:    Date/Time: 09/08/2509:44 AM  Consent Given By: the patient  Position: PRONE  Additional Comments: Vital signs were monitored before and after the procedure. Patient was prepped and draped in the usual sterile fashion. The correct patient, procedure, and site was verified.   Injection Procedure Details:   Procedure diagnoses: Cervical radiculopathy [M54.12]    Meds Administered:   Meds ordered this encounter  Medications   methylPREDNISolone  acetate (DEPO-MEDROL ) injection 40 mg     Laterality: Left  Location/Site: C7-T1  Needle: 3.5 in., 20 ga. Tuohy  Needle Placement: Paramedian epidural space  Findings:  -Comments: Excellent flow of contrast into the epidural space.  Procedure Details: Using a paramedian approach from the side mentioned above, the region overlying the inferior lamina was localized under fluoroscopic visualization and the soft tissues overlying this structure were infiltrated with 4 ml. of 1% Lidocaine  without Epinephrine . A # 20 gauge, Tuohy needle was inserted into the epidural space using a paramedian approach.  The epidural space was localized using loss of resistance along with contralateral oblique bi-planar fluoroscopic views.  After negative aspirate for air, blood, and CSF, a 2 ml. volume of Isovue-250 was injected into the epidural space and the flow of contrast was observed. Radiographs were obtained for documentation purposes.   The injectate was administered into the level noted above.  Additional Comments:  The patient tolerated the procedure well Dressing: 2 x 2 sterile gauze and Band-Aid    Post-procedure details: Patient was observed during the procedure. Post-procedure instructions were reviewed.  Patient left the clinic in stable condition.   Clinical History: Narrative & Impression EXAM: MRI CERVICAL SPINE WITHOUT CONTRAST 07/21/2024 02:08:33 PM   TECHNIQUE: Multiplanar multisequence MRI of the cervical spine was performed.   COMPARISON: 01/17/2013   CLINICAL HISTORY: Chronic neck pain for 4 months with bilateral arm weakness/numbness.   FINDINGS:   BONES AND ALIGNMENT: 1.5 mm degenerative anterolisthesis at C7-T1.  Normal vertebral body heights. Type 1 degenerative endplate findings at C5-C6, C6-C7, and T1-T2. Disc desiccation throughout the cervical spine noted with loss of disc height especially  at C5-C6 and C6-C7.   SPINAL CORD: Normal spinal cord size. No abnormal spinal cord signal.   SOFT TISSUES: No paraspinal mass.   C1-C2: Small effusion at the right C1-C2 articulation.   C2-C3: Moderate to prominent left and moderate right foraminal stenosis related to facet spurring and to a lesser degree uncinate spurring.   C3-C4: Moderate right and mild left foraminal stenosis due to facet spurring.   C4-C5: No impingement. Bilateral facet joint spurring.   C5-C6: Severe bilateral foraminal stenosis and borderline central stenosis due to disk osteophyte complex and facet arthropathy. Worsened from prior.   C6-C7: Prominent bilateral foraminal stenosis and moderate central stenosis due to disc bulge, uncinate spurring, and facet arthropathy. Mildly worsened from prior.   C7-T1: No impingement. Mild bilateral degenerative facet arthropathy.   T1-T2: Moderate right foraminal stenosis due to right foraminal disc protrusion and facet arthropathy. This level was only included on the sagittal/parasagittal images.   T2-T3: Moderate right foraminal stenosis due to disc bulge and facet arthropathy. This level was only included on the sagittal/parasagittal images.   IMPRESSION: 1. Severe bilateral foraminal stenosis and borderline central stenosis at C5-6, worsened from prior. 2. Prominent bilateral foraminal stenosis and moderate central stenosis at C6-7, mildly worsened from prior. 3. Moderate to prominent left and moderate right foraminal stenosis at C2-3. 4. Moderate right foraminal stenosis at T2-3. 5. Moderate right foraminal stenosis at T1-2. 6. Moderate right foraminal stenosis and mild left foraminal stenosis at C3-4.   Electronically signed by: Ryan Salvage MD 07/24/2024 03:21 PM EDT RP Workstation: [id number]     Objective:  VS:  HT:    WT:   BMI:     BP:134/88  HR:87bpm  TEMP: ( )  RESP:  Physical Exam Vitals and nursing note reviewed.   Constitutional:      General: He is not in acute distress.    Appearance: Normal appearance. He is not ill-appearing.  HENT:     Head: Normocephalic and atraumatic.     Right Ear: External ear normal.     Left Ear: External ear normal.  Eyes:     Extraocular Movements: Extraocular movements intact.  Cardiovascular:     Rate and Rhythm: Normal rate.     Pulses: Normal pulses.  Abdominal:     General: There is no distension.     Palpations: Abdomen is soft.  Musculoskeletal:        General: No signs of injury.     Cervical back: Neck supple. Tenderness present. No rigidity.     Right lower leg: No edema.     Left lower leg: No edema.     Comments: Patient has good strength in the upper extremities with 5 out of 5 strength in wrist extension long finger flexion APB.  No intrinsic hand muscle atrophy.  Negative Hoffmann's test.  Lymphadenopathy:     Cervical: No cervical adenopathy.  Skin:    Findings: No erythema or rash.  Neurological:     General: No focal deficit present.     Mental Status: He is alert and oriented to person, place, and time.     Sensory: No sensory deficit.     Motor: No weakness or abnormal muscle tone.     Coordination: Coordination normal.  Psychiatric:        Mood and Affect:  Mood normal.        Behavior: Behavior normal.      Imaging: No results found.

## 2024-09-08 NOTE — Procedures (Signed)
 Cervical Epidural Steroid Injection - Interlaminar Approach with Fluoroscopic Guidance  Patient: Ronald Parsons      Date of Birth: February 11, 1956 MRN: [id number] PCP: Chandra Toribio POUR, MD      Visit Date: 08/27/2024   Universal Protocol:    Date/Time: 09/08/2509:44 AM  Consent Given By: the patient  Position: PRONE  Additional Comments: Vital signs were monitored before and after the procedure. Patient was prepped and draped in the usual sterile fashion. The correct patient, procedure, and site was verified.   Injection Procedure Details:   Procedure diagnoses: Cervical radiculopathy [M54.12]    Meds Administered:  Meds ordered this encounter  Medications   methylPREDNISolone  acetate (DEPO-MEDROL ) injection 40 mg     Laterality: Left  Location/Site: C7-T1  Needle: 3.5 in., 20 ga. Tuohy  Needle Placement: Paramedian epidural space  Findings:  -Comments: Excellent flow of contrast into the epidural space.  Procedure Details: Using a paramedian approach from the side mentioned above, the region overlying the inferior lamina was localized under fluoroscopic visualization and the soft tissues overlying this structure were infiltrated with 4 ml. of 1% Lidocaine  without Epinephrine . A # 20 gauge, Tuohy needle was inserted into the epidural space using a paramedian approach.  The epidural space was localized using loss of resistance along with contralateral oblique bi-planar fluoroscopic views.  After negative aspirate for air, blood, and CSF, a 2 ml. volume of Isovue-250 was injected into the epidural space and the flow of contrast was observed. Radiographs were obtained for documentation purposes.   The injectate was administered into the level noted above.  Additional Comments:  The patient tolerated the procedure well Dressing: 2 x 2 sterile gauze and Band-Aid    Post-procedure details: Patient was observed during the procedure. Post-procedure instructions were  reviewed.  Patient left the clinic in stable condition.

## 2024-09-09 ENCOUNTER — Ambulatory Visit: Admitting: Orthopedic Surgery

## 2024-09-09 ENCOUNTER — Ambulatory Visit (INDEPENDENT_AMBULATORY_CARE_PROVIDER_SITE_OTHER): Admitting: Orthopedic Surgery

## 2024-09-09 DIAGNOSIS — M65341 Trigger finger, right ring finger: Secondary | ICD-10-CM | POA: Diagnosis not present

## 2024-09-09 DIAGNOSIS — M65331 Trigger finger, right middle finger: Secondary | ICD-10-CM | POA: Diagnosis not present

## 2024-09-09 MED ORDER — LIDOCAINE HCL 1 % IJ SOLN
1.0000 mL | INTRAMUSCULAR | Status: AC | PRN
  Administered 2024-09-09: 1 mL

## 2024-09-09 MED ORDER — BETAMETHASONE SOD PHOS & ACET 6 (3-3) MG/ML IJ SUSP
6.0000 mg | INTRAMUSCULAR | Status: AC | PRN
  Administered 2024-09-09: 6 mg via INTRA_ARTICULAR

## 2024-09-09 NOTE — Progress Notes (Signed)
 Ronald Parsons - 68 y.o. male MRN [id number]  Date of birth: 04/13/1956  Office Visit Note: Visit Date: 09/09/2024 PCP: Chandra Toribio POUR, MD Referred by: Chandra Toribio POUR, MD  Subjective: No chief complaint on file.  HPI: Ronald Parsons is a pleasant 68 y.o. male who presents today for evaluation of right long and ring finger triggering that has been present now for multiple months.  States that the digits will occasionally get locked requiring manual correction, are becoming increasingly painful at the PIP regions.  Has not undergone prior workup or treatment.    He is here today for specific hand surgical evaluation.  Pertinent ROS were reviewed with the patient and found to be negative unless otherwise specified above in HPI.   Visit Reason: Right long and ring finger trigger digits Duration of symptoms: Multiple months Hand dominance: right Occupation: Retired Diabetic: No Smoking: No Heart/Lung History: None Blood Thinners: None  Prior Testing/EMG: None Injections (Date): None Treatments: None Prior Surgery: None    Assessment & Plan: Visit Diagnoses:  1. Trigger finger, right middle finger   2. Trigger finger, right ring finger     Plan: Extensive discussion was had with the patient today regarding his right long and ring trigger digit.  We discussed the etiology and pathophysiology of stenosing tenosynovitis.  We discussed conservative versus surgical treatment modalities.  From a conservative standpoint, we discussed activity modification, splinting, therapy and injections.  From a surgical standpoint, we discussed the possibility for trigger digit release as well as all risk and benefits associated.  Given that he has not trialed conservative treatments, patient is appropriate candidate for cortisone injection to the right long and ring finger A1 pulley for symptom relief.  Risks and benefit of the cortisone injection were discussed in detail, patient agreed to  proceed.  Injections provided today without issue, patient will return in approximate 6 weeks time for a recheck.   Follow-up: No follow-ups on file.   Meds & Orders: No orders of the defined types were placed in this encounter.   Orders Placed This Encounter  Procedures   Hand/UE Inj   Hand/UE Inj     Procedures: Hand/UE Inj: R long A1 for trigger finger on 09/09/2024 8:32 PM Indications: pain Details: 25 G needle, volar approach Medications: 1 mL lidocaine  1 %; 6 mg betamethasone acetate-betamethasone sodium phosphate  6 (3-3) MG/ML Outcome: tolerated well, no immediate complications Consent was given by the patient. Patient was prepped and draped in the usual sterile fashion.    Hand/UE Inj: R ring A1 for trigger finger on 09/09/2024 8:32 PM Indications: pain Details: 25 G needle, volar approach Medications: 1 mL lidocaine  1 %; 6 mg betamethasone acetate-betamethasone sodium phosphate  6 (3-3) MG/ML Outcome: tolerated well, no immediate complications Consent was given by the patient. Patient was prepped and draped in the usual sterile fashion.          Clinical History: Narrative & Impression EXAM: MRI CERVICAL SPINE WITHOUT CONTRAST 07/21/2024 02:08:33 PM   TECHNIQUE: Multiplanar multisequence MRI of the cervical spine was performed.   COMPARISON: 01/17/2013   CLINICAL HISTORY: Chronic neck pain for 4 months with bilateral arm weakness/numbness.   FINDINGS:   BONES AND ALIGNMENT: 1.5 mm degenerative anterolisthesis at C7-T1. Normal vertebral body heights. Type 1 degenerative endplate findings at C5-C6, C6-C7, and T1-T2. Disc desiccation throughout the cervical spine noted with loss of disc height especially at C5-C6 and C6-C7.   SPINAL CORD: Normal spinal cord size. No abnormal  spinal cord signal.   SOFT TISSUES: No paraspinal mass.   C1-C2: Small effusion at the right C1-C2 articulation.   C2-C3: Moderate to prominent left and moderate right  foraminal stenosis related to facet spurring and to a lesser degree uncinate spurring.   C3-C4: Moderate right and mild left foraminal stenosis due to facet spurring.   C4-C5: No impingement. Bilateral facet joint spurring.   C5-C6: Severe bilateral foraminal stenosis and borderline central stenosis due to disk osteophyte complex and facet arthropathy. Worsened from prior.   C6-C7: Prominent bilateral foraminal stenosis and moderate central stenosis due to disc bulge, uncinate spurring, and facet arthropathy. Mildly worsened from prior.   C7-T1: No impingement. Mild bilateral degenerative facet arthropathy.   T1-T2: Moderate right foraminal stenosis due to right foraminal disc protrusion and facet arthropathy. This level was only included on the sagittal/parasagittal images.   T2-T3: Moderate right foraminal stenosis due to disc bulge and facet arthropathy. This level was only included on the sagittal/parasagittal images.   IMPRESSION: 1. Severe bilateral foraminal stenosis and borderline central stenosis at C5-6, worsened from prior. 2. Prominent bilateral foraminal stenosis and moderate central stenosis at C6-7, mildly worsened from prior. 3. Moderate to prominent left and moderate right foraminal stenosis at C2-3. 4. Moderate right foraminal stenosis at T2-3. 5. Moderate right foraminal stenosis at T1-2. 6. Moderate right foraminal stenosis and mild left foraminal stenosis at C3-4.   Electronically signed by: Ryan Salvage MD 07/24/2024 03:21 PM EDT RP Workstation: [id number]  He reports that he has quit smoking. He has been exposed to tobacco smoke. His smokeless tobacco use includes chew.  Recent Labs    06/25/24 1351  HGBA1C 5.6    Objective:   Vital Signs: There were no vitals taken for this visit.  Physical Exam  Gen: Well-appearing, in no acute distress; non-toxic CV: Regular Rate. Well-perfused. Warm.  Resp: Breathing unlabored on room air; no  wheezing. Psych: Fluid speech in conversation; appropriate affect; normal thought process  Ortho Exam Right hand: - Palpable nodules at the A1 pulley of the long and ring finger, associated tenderness - Notable clicking with deep flexion of the long and ring finger, mild evidence of significant locking with deep flexion - Sensation intact distally, hand remains warm well-perfused    Imaging: No results found.  Past Medical/Family/Surgical/Social History: Medications & Allergies reviewed per EMR, new medications updated. Patient Active Problem List   Diagnosis Date Noted   Chronic joint pain 03/27/2024   Eczema 03/04/2024   Bright red blood per rectum 03/03/2024   Polycythemia 12/26/2023   Enlarged skin mole 05/26/2023   Rash 05/26/2023   Dyslipidemia, goal LDL below 100 01/16/2023   Other fatigue 01/16/2023   Hypertrophy of prostate 01/16/2023   Chronic right hip pain 01/16/2023   Loud snoring 10/17/2022   Excessive daytime sleepiness 10/17/2022   Wheezing 06/09/2022   Dysuria 03/06/2022   Elevated prolactin level 02/14/2022   Murmur, cardiac 11/15/2021   Essential hypertension 10/18/2021   Low testosterone  in male 10/18/2021   Primary insomnia 10/18/2021   Body mass index (BMI) of 34.0-34.9 in adult 10/18/2021   Angina pectoris 06/09/2020   Lumbar radiculopathy 07/29/2013   Past Medical History:  Diagnosis Date   Asthma    allergy related   Cervical radiculitis    Depression    DISH (diffuse idiopathic skeletal hyperostosis)    Gout    Hyperlipidemia    Hypertension    Lumbago    Osteoarthritis    Family History  Problem Relation Age of Onset   Heart disease Father    Hypertension Father    Breast cancer Sister    Valvular heart disease Maternal Grandfather    Emphysema Paternal Grandfather    Past Surgical History:  Procedure Laterality Date   BACK SURGERY  10/24/1989   laminectomy   CARDIAC CATHETERIZATION     5-7 years ago, states it was clear    COLONOSCOPY     HERNIA REPAIR     inguinal hernia repair as a baby on right groin   HIP SURGERY Right    01/2023   KNEE SURGERY Right    2023   RIGHT/LEFT HEART CATH AND CORONARY ANGIOGRAPHY N/A 06/09/2020   Procedure: RIGHT/LEFT HEART CATH AND CORONARY ANGIOGRAPHY;  Surgeon: Ladona Heinz, MD;  Location: [facility];  Service: Cardiovascular;  Laterality: N/A;   SHOULDER SURGERY Bilateral    2017   Social History   Occupational History   Not on file  Tobacco Use   Smoking status: Former    Passive exposure: Past   Smokeless tobacco: Current    Types: Chew  Vaping Use   Vaping status: Never Used  Substance and Sexual Activity   Alcohol use: Yes    Comment: once monthly   Drug use: No   Sexual activity: Not Currently    Conner Neiss Estela) Arlinda, M.D. [facility], Hand Surgery

## 2024-09-25 ENCOUNTER — Other Ambulatory Visit: Payer: Self-pay | Admitting: Family Medicine

## 2024-09-25 DIAGNOSIS — M4716 Other spondylosis with myelopathy, lumbar region: Secondary | ICD-10-CM

## 2024-10-08 ENCOUNTER — Other Ambulatory Visit: Payer: Self-pay | Admitting: Family Medicine

## 2024-10-08 DIAGNOSIS — M4716 Other spondylosis with myelopathy, lumbar region: Secondary | ICD-10-CM

## 2024-10-20 NOTE — Progress Notes (Deleted)
 "  [name] - 68 y.o. male MRN [id number]  Date of birth: 09/07/1956  Office Visit Note: Visit Date: 10/21/2024 PCP: Chandra Toribio POUR, MD Referred by: Chandra Toribio POUR, MD  Subjective: No chief complaint on file.  HPI: Ronald Parsons is a pleasant 68 y.o. male who returns today for follow-up of right long and ring finger triggering that has been present now for multiple months.  States that the digits will occasionally get locked requiring manual correction, are becoming increasingly painful at the PIP regions.  Has undergone injection to the right long and ring finger A1 pulley regions approximately 6 weeks prior.***    Pertinent ROS were reviewed with the patient and found to be negative unless otherwise specified above in HPI.       Assessment & Plan: Visit Diagnoses:  No diagnosis found.   Plan: ***   Follow-up: No follow-ups on file.   Meds & Orders: No orders of the defined types were placed in this encounter.   No orders of the defined types were placed in this encounter.    Procedures: No procedures performed      Clinical History: Narrative & Impression EXAM: MRI CERVICAL SPINE WITHOUT CONTRAST 07/21/2024 02:08:33 PM   TECHNIQUE: Multiplanar multisequence MRI of the cervical spine was performed.   COMPARISON: 01/17/2013   CLINICAL HISTORY: Chronic neck pain for 4 months with bilateral arm weakness/numbness.   FINDINGS:   BONES AND ALIGNMENT: 1.5 mm degenerative anterolisthesis at C7-T1. Normal vertebral body heights. Type 1 degenerative endplate findings at C5-C6, C6-C7, and T1-T2. Disc desiccation throughout the cervical spine noted with loss of disc height especially at C5-C6 and C6-C7.   SPINAL CORD: Normal spinal cord size. No abnormal spinal cord signal.   SOFT TISSUES: No paraspinal mass.   C1-C2: Small effusion at the right C1-C2 articulation.   C2-C3: Moderate to prominent left and moderate right foraminal stenosis related  to facet spurring and to a lesser degree uncinate spurring.   C3-C4: Moderate right and mild left foraminal stenosis due to facet spurring.   C4-C5: No impingement. Bilateral facet joint spurring.   C5-C6: Severe bilateral foraminal stenosis and borderline central stenosis due to disk osteophyte complex and facet arthropathy. Worsened from prior.   C6-C7: Prominent bilateral foraminal stenosis and moderate central stenosis due to disc bulge, uncinate spurring, and facet arthropathy. Mildly worsened from prior.   C7-T1: No impingement. Mild bilateral degenerative facet arthropathy.   T1-T2: Moderate right foraminal stenosis due to right foraminal disc protrusion and facet arthropathy. This level was only included on the sagittal/parasagittal images.   T2-T3: Moderate right foraminal stenosis due to disc bulge and facet arthropathy. This level was only included on the sagittal/parasagittal images.   IMPRESSION: 1. Severe bilateral foraminal stenosis and borderline central stenosis at C5-6, worsened from prior. 2. Prominent bilateral foraminal stenosis and moderate central stenosis at C6-7, mildly worsened from prior. 3. Moderate to prominent left and moderate right foraminal stenosis at C2-3. 4. Moderate right foraminal stenosis at T2-3. 5. Moderate right foraminal stenosis at T1-2. 6. Moderate right foraminal stenosis and mild left foraminal stenosis at C3-4.   Electronically signed by: Ryan Salvage MD 07/24/2024 03:21 PM EDT RP Workstation: [id number]  He reports that he has quit smoking. He has been exposed to tobacco smoke. His smokeless tobacco use includes chew.  Recent Labs    06/25/24 1351  HGBA1C 5.6    Objective:   Vital Signs: There were no vitals taken for this  visit.  Physical Exam  Gen: Well-appearing, in no acute distress; non-toxic CV: Regular Rate. Well-perfused. Warm.  Resp: Breathing unlabored on room air; no wheezing. Psych: Fluid speech  in conversation; appropriate affect; normal thought process  Ortho Exam Right hand: - Palpable nodules at the A1 pulley of the long and ring finger, associated tenderness - Notable clicking with deep flexion of the long and ring finger, mild evidence of significant locking with deep flexion - Sensation intact distally, hand remains warm well-perfused    Imaging: No results found.  Past Medical/Family/Surgical/Social History: Medications & Allergies reviewed per EMR, new medications updated. Patient Active Problem List   Diagnosis Date Noted   Chronic joint pain 03/27/2024   Eczema 03/04/2024   Bright red blood per rectum 03/03/2024   Polycythemia 12/26/2023   Enlarged skin mole 05/26/2023   Rash 05/26/2023   Dyslipidemia, goal LDL below 100 01/16/2023   Other fatigue 01/16/2023   Hypertrophy of prostate 01/16/2023   Chronic right hip pain 01/16/2023   Loud snoring 10/17/2022   Excessive daytime sleepiness 10/17/2022   Wheezing 06/09/2022   Dysuria 03/06/2022   Elevated prolactin level 02/14/2022   Murmur, cardiac 11/15/2021   Essential hypertension 10/18/2021   Low testosterone  in male 10/18/2021   Primary insomnia 10/18/2021   Body mass index (BMI) of 34.0-34.9 in adult 10/18/2021   Angina pectoris 06/09/2020   Lumbar radiculopathy 07/29/2013   Past Medical History:  Diagnosis Date   Asthma    allergy related   Cervical radiculitis    Depression    DISH (diffuse idiopathic skeletal hyperostosis)    Gout    Hyperlipidemia    Hypertension    Lumbago    Osteoarthritis    Family History  Problem Relation Age of Onset   Heart disease Father    Hypertension Father    Breast cancer Sister    Valvular heart disease Maternal Grandfather    Emphysema Paternal Grandfather    Past Surgical History:  Procedure Laterality Date   BACK SURGERY  10/24/1989   laminectomy   CARDIAC CATHETERIZATION     5-7 years ago, states it was clear   COLONOSCOPY     HERNIA REPAIR      inguinal hernia repair as a baby on right groin   HIP SURGERY Right    01/2023   KNEE SURGERY Right    2023   RIGHT/LEFT HEART CATH AND CORONARY ANGIOGRAPHY N/A 06/09/2020   Procedure: RIGHT/LEFT HEART CATH AND CORONARY ANGIOGRAPHY;  Surgeon: Ladona Heinz, MD;  Location: [facility];  Service: Cardiovascular;  Laterality: N/A;   SHOULDER SURGERY Bilateral    2017   Social History   Occupational History   Not on file  Tobacco Use   Smoking status: Former    Passive exposure: Past   Smokeless tobacco: Current    Types: Chew  Vaping Use   Vaping status: Never Used  Substance and Sexual Activity   Alcohol use: Yes    Comment: once monthly   Drug use: No   Sexual activity: Not Currently    Dalisa Forrer Estela) Arlinda, M.D. [facility], Hand Surgery  "

## 2024-10-21 ENCOUNTER — Ambulatory Visit: Admitting: Orthopedic Surgery

## 2024-10-29 ENCOUNTER — Telehealth: Payer: Self-pay

## 2024-10-29 NOTE — Telephone Encounter (Signed)
 Called patient pcp is advised

## 2024-10-29 NOTE — Telephone Encounter (Signed)
 Please let him know he can start with miralax  daily - 1 capful per 6 oz water.  If he goes more than 2 days without a bowel movement he should increase it by 1 capful every day until he has a bowel movement again, and then start back at 1 to 2 capfuls a day.  But he should take at least 1 capful of miralax  every day instead of waiting until he gets constipated, as long as this is not causing excessive bowel movements or diarrhea.

## 2024-10-29 NOTE — Telephone Encounter (Signed)
 Copied from CRM [id number]. Topic: Clinical - Medical Advice >> Oct 29, 2024  1:49 PM Montie POUR wrote: Reason for CRM:  I read Dr. Chandra note dated 10/29/24. Ronald Parsons would like a nurse to call him back to see if he can take a prescription pill or tablet to help with bowel movements. He knew somebody who took a prescription pill and it helped him. Please call [phone]        Called patient he stated that he wants something in pill form he knew a friend that took something. He said if not he will try the Miralax 

## 2024-10-29 NOTE — Telephone Encounter (Signed)
 Called patient LVM to contact the office if pt calls please advise

## 2024-10-29 NOTE — Telephone Encounter (Signed)
 Copied from CRM [id number]. Topic: Clinical - Medication Question >> Oct 29, 2024 10:55 AM Ronald Parsons wrote: Reason for CRM: Patient states since his 2nd dose of Zepbound  he has been consistently constipated. Wants to know if there is something he should be taking everyday or what he should do?  Patient can be reached at [phone]

## 2024-10-30 MED ORDER — LINACLOTIDE 72 MCG PO CAPS: 72.0000 ug | ORAL_CAPSULE | Freq: Every day | ORAL | 2 refills | Status: AC

## 2024-10-30 NOTE — Addendum Note (Signed)
 Addended by: Elicia Lui, DAN K on: 10/30/2024 07:30 AM   Modules accepted: Orders

## 2024-10-30 NOTE — Telephone Encounter (Signed)
 I'm not sure which medication he is talking about but I will try sending in linzess  but I don't know that if there is a prior auth required if he would be approved.

## 2024-10-30 NOTE — Telephone Encounter (Signed)
 Called patient LVM to contact the office if pt calls please advise

## 2024-10-31 ENCOUNTER — Ambulatory Visit: Payer: Medicare Other

## 2024-10-31 NOTE — Telephone Encounter (Signed)
 Patient is advised.

## 2024-12-24 ENCOUNTER — Other Ambulatory Visit

## 2024-12-31 ENCOUNTER — Ambulatory Visit: Admitting: Family Medicine
